# Patient Record
Sex: Male | Born: 1960 | Race: Black or African American | Hispanic: No | Marital: Single | Smoking: Never smoker
Health system: Southern US, Community
[De-identification: ages and names within clinical notes are randomized; demographics above are authoritative.]

## PROBLEM LIST (undated history)

## (undated) DIAGNOSIS — I2699 Other pulmonary embolism without acute cor pulmonale: Secondary | ICD-10-CM

## (undated) DIAGNOSIS — R0600 Dyspnea, unspecified: Secondary | ICD-10-CM

## (undated) DIAGNOSIS — I2510 Atherosclerotic heart disease of native coronary artery without angina pectoris: Secondary | ICD-10-CM

## (undated) DIAGNOSIS — I4891 Unspecified atrial fibrillation: Secondary | ICD-10-CM

## (undated) DIAGNOSIS — I5031 Acute diastolic (congestive) heart failure: Secondary | ICD-10-CM

## (undated) DIAGNOSIS — M5137 Other intervertebral disc degeneration, lumbosacral region: Secondary | ICD-10-CM

## (undated) DIAGNOSIS — I10 Essential (primary) hypertension: Secondary | ICD-10-CM

## (undated) DIAGNOSIS — L0390 Cellulitis, unspecified: Secondary | ICD-10-CM

## (undated) DIAGNOSIS — D649 Anemia, unspecified: Secondary | ICD-10-CM

## (undated) DIAGNOSIS — R5383 Other fatigue: Secondary | ICD-10-CM

## (undated) DIAGNOSIS — D638 Anemia in other chronic diseases classified elsewhere: Secondary | ICD-10-CM

## (undated) DIAGNOSIS — Z79899 Other long term (current) drug therapy: Secondary | ICD-10-CM

## (undated) DIAGNOSIS — M159 Polyosteoarthritis, unspecified: Secondary | ICD-10-CM

## (undated) DIAGNOSIS — I248 Other forms of acute ischemic heart disease: Secondary | ICD-10-CM

## (undated) DIAGNOSIS — G4733 Obstructive sleep apnea (adult) (pediatric): Secondary | ICD-10-CM

## (undated) DIAGNOSIS — I5032 Chronic diastolic (congestive) heart failure: Secondary | ICD-10-CM

## (undated) DIAGNOSIS — N390 Urinary tract infection, site not specified: Secondary | ICD-10-CM

## (undated) DIAGNOSIS — R5381 Other malaise: Secondary | ICD-10-CM

## (undated) DIAGNOSIS — M1990 Unspecified osteoarthritis, unspecified site: Secondary | ICD-10-CM

## (undated) DIAGNOSIS — N200 Calculus of kidney: Secondary | ICD-10-CM

## (undated) DIAGNOSIS — N281 Cyst of kidney, acquired: Secondary | ICD-10-CM

## (undated) DIAGNOSIS — J90 Pleural effusion, not elsewhere classified: Secondary | ICD-10-CM

## (undated) DIAGNOSIS — R7303 Prediabetes: Secondary | ICD-10-CM

## (undated) DIAGNOSIS — E538 Deficiency of other specified B group vitamins: Secondary | ICD-10-CM

## (undated) DIAGNOSIS — M1A09X0 Idiopathic chronic gout, multiple sites, without tophus (tophi): Secondary | ICD-10-CM

## (undated) DIAGNOSIS — Z6841 Body Mass Index (BMI) 40.0 and over, adult: Secondary | ICD-10-CM

## (undated) DIAGNOSIS — Z7901 Long term (current) use of anticoagulants: Secondary | ICD-10-CM

## (undated) DIAGNOSIS — I480 Paroxysmal atrial fibrillation: Secondary | ICD-10-CM

## (undated) DIAGNOSIS — E782 Mixed hyperlipidemia: Secondary | ICD-10-CM

## (undated) DIAGNOSIS — R768 Other specified abnormal immunological findings in serum: Secondary | ICD-10-CM

## (undated) DIAGNOSIS — R319 Hematuria, unspecified: Secondary | ICD-10-CM

## (undated) DIAGNOSIS — Z9989 Dependence on other enabling machines and devices: Secondary | ICD-10-CM

## (undated) DIAGNOSIS — H409 Unspecified glaucoma: Secondary | ICD-10-CM

## (undated) HISTORY — DX: Pleural effusion, not elsewhere classified: J90

## (undated) HISTORY — DX: Chronic diastolic (congestive) heart failure: I50.32

## (undated) HISTORY — DX: Cellulitis, unspecified: L03.90

## (undated) HISTORY — DX: Other malaise: R53.81

## (undated) HISTORY — DX: Other fatigue: R53.83

## (undated) HISTORY — DX: Idiopathic chronic gout, multiple sites, without tophus (tophi): M1A.09X0

## (undated) HISTORY — DX: Cyst of kidney, acquired: N28.1

## (undated) HISTORY — DX: Unspecified atrial fibrillation: I48.91

## (undated) HISTORY — DX: Anemia in other chronic diseases classified elsewhere: D63.8

## (undated) HISTORY — DX: Mixed hyperlipidemia: E78.2

## (undated) HISTORY — DX: Deficiency of other specified B group vitamins: E53.8

## (undated) HISTORY — DX: Hematuria, unspecified: R31.9

## (undated) HISTORY — DX: Morbid (severe) obesity due to excess calories: E66.01

## (undated) HISTORY — DX: Acute diastolic (congestive) heart failure: I50.31

## (undated) HISTORY — DX: Essential (primary) hypertension: I10

## (undated) HISTORY — DX: Other intervertebral disc degeneration, lumbosacral region: M51.37

## (undated) HISTORY — DX: Polyosteoarthritis, unspecified: M15.9

## (undated) HISTORY — DX: Body Mass Index (BMI) 40.0 and over, adult: Z684

## (undated) HISTORY — DX: Other forms of acute ischemic heart disease: I24.8

## (undated) HISTORY — DX: Other specified abnormal immunological findings in serum: R76.8

## (undated) HISTORY — DX: Long term (current) use of anticoagulants: Z79.01

## (undated) HISTORY — DX: Unspecified glaucoma: H40.9

## (undated) HISTORY — PX: CYSTOSCOPY W/ STONE MANIPULATION: SHX1427

## (undated) HISTORY — DX: Atherosclerotic heart disease of native coronary artery without angina pectoris: I25.10

## (undated) HISTORY — DX: Urinary tract infection, site not specified: N39.0

## (undated) HISTORY — DX: Calculus of kidney: N20.0

## (undated) HISTORY — DX: Prediabetes: R73.03

## (undated) HISTORY — DX: Paroxysmal atrial fibrillation: I48.0

## (undated) HISTORY — DX: Other pulmonary embolism without acute cor pulmonale: I26.99

## (undated) HISTORY — DX: Other long term (current) drug therapy: Z79.899

---
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
## 2002-02-07 ENCOUNTER — Emergency Department (HOSPITAL_COMMUNITY): Admission: EM | Admit: 2002-02-07 | Discharge: 2002-02-07 | Payer: Self-pay | Admitting: Emergency Medicine

## 2002-09-30 ENCOUNTER — Inpatient Hospital Stay (HOSPITAL_COMMUNITY): Admission: EM | Admit: 2002-09-30 | Discharge: 2002-10-06 | Payer: Self-pay | Admitting: Internal Medicine

## 2002-10-05 ENCOUNTER — Encounter: Payer: Self-pay | Admitting: Internal Medicine

## 2002-10-14 ENCOUNTER — Encounter: Admission: RE | Admit: 2002-10-14 | Discharge: 2002-10-14 | Payer: Self-pay | Admitting: Internal Medicine

## 2010-08-18 ENCOUNTER — Observation Stay (HOSPITAL_COMMUNITY)
Admission: EM | Admit: 2010-08-18 | Discharge: 2010-08-19 | Disposition: A | Discharge status: Home / Self Care | Payer: BC Managed Care – PPO | Attending: Nurse Practitioner | Admitting: Nurse Practitioner

## 2010-08-18 DIAGNOSIS — L02419 Cutaneous abscess of limb, unspecified: Principal | ICD-10-CM | POA: Insufficient documentation

## 2010-08-18 LAB — DIFFERENTIAL
Basophils Relative: 0 % (ref 0–1)
Lymphocytes Relative: 11 % — ABNORMAL LOW (ref 12–46)
Lymphs Abs: 1.7 10*3/uL (ref 0.7–4.0)
Monocytes Relative: 12 % (ref 3–12)
Neutro Abs: 11.1 10*3/uL — ABNORMAL HIGH (ref 1.7–7.7)
Neutrophils Relative %: 76 % (ref 43–77)

## 2010-08-18 LAB — CBC
HCT: 33.1 % — ABNORMAL LOW (ref 39.0–52.0)
Hemoglobin: 10.7 g/dL — ABNORMAL LOW (ref 13.0–17.0)
MCH: 28.2 pg (ref 26.0–34.0)
MCV: 87.1 fL (ref 78.0–100.0)
RBC: 3.8 MIL/uL — ABNORMAL LOW (ref 4.22–5.81)

## 2010-08-18 LAB — BASIC METABOLIC PANEL
CO2: 27 mEq/L (ref 19–32)
Chloride: 100 mEq/L (ref 96–112)
GFR calc Af Amer: >60 mL/min (ref >60)
Potassium: 3.9 mEq/L (ref 3.5–5.1)
Sodium: 135 mEq/L (ref 135–145)

## 2010-08-18 LAB — PROTIME-INR: Prothrombin Time: 15.9 seconds — ABNORMAL HIGH (ref 11.6–15.2)

## 2010-08-18 LAB — D-DIMER, QUANTITATIVE: D-Dimer, Quant: 1.2 ug/mL-FEU — ABNORMAL HIGH (ref 0.00–0.48)

## 2010-08-20 DIAGNOSIS — M7989 Other specified soft tissue disorders: Secondary | ICD-10-CM

## 2010-08-25 LAB — CULTURE, BLOOD (ROUTINE X 2)
Culture  Setup Time: 201202191121
Culture  Setup Time: 201202191121

## 2010-08-27 ENCOUNTER — Inpatient Hospital Stay (HOSPITAL_COMMUNITY)
Admission: EM | Admit: 2010-08-27 | Discharge: 2010-08-31 | DRG: 278 | Disposition: A | Discharge status: Home Health | Payer: BC Managed Care – PPO | Attending: Internal Medicine | Admitting: Internal Medicine

## 2010-08-27 DIAGNOSIS — M7989 Other specified soft tissue disorders: Secondary | ICD-10-CM

## 2010-08-27 DIAGNOSIS — D509 Iron deficiency anemia, unspecified: Secondary | ICD-10-CM | POA: Diagnosis present

## 2010-08-27 DIAGNOSIS — M109 Gout, unspecified: Secondary | ICD-10-CM | POA: Diagnosis present

## 2010-08-27 DIAGNOSIS — I10 Essential (primary) hypertension: Secondary | ICD-10-CM | POA: Diagnosis present

## 2010-08-27 DIAGNOSIS — B353 Tinea pedis: Secondary | ICD-10-CM | POA: Diagnosis present

## 2010-08-27 DIAGNOSIS — G4733 Obstructive sleep apnea (adult) (pediatric): Secondary | ICD-10-CM | POA: Diagnosis present

## 2010-08-27 DIAGNOSIS — H409 Unspecified glaucoma: Secondary | ICD-10-CM | POA: Diagnosis present

## 2010-08-27 DIAGNOSIS — L02419 Cutaneous abscess of limb, unspecified: Principal | ICD-10-CM | POA: Diagnosis present

## 2010-08-27 HISTORY — DX: Essential (primary) hypertension: I10

## 2010-08-27 HISTORY — DX: Morbid (severe) obesity due to excess calories: E66.01

## 2010-08-27 LAB — SEDIMENTATION RATE: Sed Rate: 136 mm/hr — ABNORMAL HIGH (ref 0–16)

## 2010-08-27 LAB — DIFFERENTIAL
Basophils Relative: 0 % (ref 0–1)
Eosinophils Absolute: 0.4 10*3/uL (ref 0.0–0.7)
Eosinophils Relative: 2 % (ref 0–5)
Lymphs Abs: 2.6 10*3/uL (ref 0.7–4.0)
Monocytes Relative: 7 % (ref 3–12)
Neutrophils Relative %: 74 % (ref 43–77)

## 2010-08-27 LAB — BASIC METABOLIC PANEL
BUN: 14 mg/dL (ref 6–23)
Creatinine, Ser: 1.36 mg/dL (ref 0.4–1.5)
GFR calc Af Amer: >60 mL/min (ref >60)
GFR calc non Af Amer: 55 mL/min — ABNORMAL LOW (ref >60)

## 2010-08-27 LAB — CBC
MCH: 27.4 pg (ref 26.0–34.0)
MCV: 86.1 fL (ref 78.0–100.0)
Platelets: 447 10*3/uL — ABNORMAL HIGH (ref 150–400)
RDW: 15.7 % — ABNORMAL HIGH (ref 11.5–15.5)
WBC: 16.2 10*3/uL — ABNORMAL HIGH (ref 4.0–10.5)

## 2010-08-28 LAB — VANCOMYCIN, TROUGH: Vancomycin Tr: 11.5 ug/mL (ref 10.0–20.0)

## 2010-08-28 LAB — HEMOGLOBIN A1C
Hgb A1c MFr Bld: 6.3 % — ABNORMAL HIGH (ref <5.7)
Mean Plasma Glucose: 134 mg/dL — ABNORMAL HIGH (ref <117)

## 2010-08-28 LAB — FOLATE: Folate: >20 ng/mL

## 2010-08-28 LAB — FERRITIN: Ferritin: 387 ng/mL — ABNORMAL HIGH (ref 22–322)

## 2010-08-29 ENCOUNTER — Encounter (HOSPITAL_COMMUNITY): Payer: Self-pay | Admitting: Radiology

## 2010-08-29 ENCOUNTER — Inpatient Hospital Stay (HOSPITAL_COMMUNITY): Payer: BC Managed Care – PPO

## 2010-08-29 HISTORY — DX: Morbid (severe) obesity due to excess calories: E66.01

## 2010-08-29 LAB — BASIC METABOLIC PANEL
CO2: 26 mEq/L (ref 19–32)
Calcium: 8.8 mg/dL (ref 8.4–10.5)
Creatinine, Ser: 0.83 mg/dL (ref 0.4–1.5)
GFR calc Af Amer: >60 mL/min (ref >60)
GFR calc non Af Amer: >60 mL/min (ref >60)

## 2010-08-29 LAB — VANCOMYCIN, TROUGH: Vancomycin Tr: 23.3 ug/mL — ABNORMAL HIGH (ref 10.0–20.0)

## 2010-08-29 MED ORDER — IOHEXOL 300 MG/ML  SOLN: 100.0000 mL | Freq: Once | INTRAMUSCULAR | Status: AC | PRN

## 2010-08-29 MED ORDER — IOHEXOL 300 MG/ML  SOLN
100.0000 mL | Freq: Once | INTRAMUSCULAR | Status: AC | PRN
  Administered 2010-08-29: 100 mL via INTRAVENOUS

## 2010-08-30 LAB — CBC
MCH: 27.9 pg (ref 26.0–34.0)
Platelets: 421 10*3/uL — ABNORMAL HIGH (ref 150–400)
RBC: 3.58 MIL/uL — ABNORMAL LOW (ref 4.22–5.81)

## 2010-08-30 LAB — BASIC METABOLIC PANEL
CO2: 26 mEq/L (ref 19–32)
Calcium: 8.8 mg/dL (ref 8.4–10.5)
Creatinine, Ser: 0.8 mg/dL (ref 0.4–1.5)
GFR calc Af Amer: >60 mL/min (ref >60)
GFR calc non Af Amer: >60 mL/min (ref >60)

## 2010-08-31 ENCOUNTER — Inpatient Hospital Stay (HOSPITAL_COMMUNITY): Payer: BC Managed Care – PPO

## 2010-08-31 LAB — CBC
HCT: 31.1 % — ABNORMAL LOW (ref 39.0–52.0)
Platelets: 406 10*3/uL — ABNORMAL HIGH (ref 150–400)
RBC: 3.61 MIL/uL — ABNORMAL LOW (ref 4.22–5.81)
RDW: 15.7 % — ABNORMAL HIGH (ref 11.5–15.5)
WBC: 12.6 10*3/uL — ABNORMAL HIGH (ref 4.0–10.5)

## 2010-09-06 NOTE — H&P (Signed)
NAME:  Colin Hall, Colin Hall                 ACCOUNT NO.:  [id number]  MEDICAL RECORD NO.:  [id number]           PATIENT TYPE:  E  LOCATION:  [facility]                         FACILITY:  [facility]  PHYSICIAN:  Debbe Odea, M.D.     DATE OF BIRTH:  04-May-1961  DATE OF ADMISSION:  08/27/2010 DATE OF DISCHARGE:                             HISTORY & PHYSICAL   PRIMARY CARE PHYSICIAN:  [name], MD in [place]:  Swelling of left leg.  HISTORY OF PRESENT ILLNESS:  This is a 50 year old morbidly obese male with a history of hypertension, glaucoma, gout, sleep apnea who developed cellulitis of his left leg about 8 days ago.  He was seen in the ER here, given 2 doses of vancomycin and discharged to home on clindamycin.  He states he has been taking the clindamycin appropriately and the redness had appeared to be improving up until last night.  He noticed yesterday an increase in pain and noticed that the redness was extending beyond the prior markings and now into his dorsum of his foot and above the knee.  He does not complain of any fevers.  He states that the swelling is not any worse.  He has had a venous Doppler in the ER which is negative for DVT and positive for prominent lymph nodes in the groin.  PAST MEDICAL HISTORY: 1. Hypertension. 2. Anemia. 3. Cellulitis of the left leg in 2004. 4. Glaucoma. 5. Gout. 6. Morbid obesity. 7. Obstructive sleep apnea, uses a BiPAP.  PAST SURGICAL HISTORY:  Tonsillectomy.  SOCIAL HISTORY:  Does not smoke, drink or use any drugs.  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  He is single.  FAMILY HISTORY:  Positive for heart disease in aunts and uncles.  ALLERGIES:  No known drug allergies.  MEDICATIONS:  As obtained from the patient. 1. Allopurinol 200 mg daily. 2. Folic acid Q000111Q mcg daily. 3. Labetalol 200 mg twice a day. 4. Lumigan eyedrops 1 drop in each eye at bedtime. 5. Ramipril  20 mg daily. 6. Spironolactone 25 mg daily. 7. Vitamin B12 1 tablet daily. 8. Clindamycin 300 mg twice a day. 9. Percocet p.r.n. which was started for his cellulitis.  REVIEW OF SYSTEMS:  No recent weight loss or weight gain.  No fever, chills, or sweats.  No frequent headaches.  HEENT:  No blurred vision, double vision, sore throat, sinus trouble or earache.  RESPIRATORY:  No cough or shortness of breath.  CARDIAC:  No chest pain, palpitations. He does have trouble with pedal edema.  GI:  No nausea, vomiting, diarrhea or constipation.  GU:  No dysuria or hematuria.  HEMATOLOGIC: No easy bruising.  SKIN:  No rash.  MUSCULOSKELETAL:  Pain in both knees.  NEUROLOGIC:  No history of strokes or seizures.  PSYCHOLOGIC: No anxiety or depression.  PHYSICAL EXAMINATION:  VITAL SIGNS:  Blood pressure 131/59, pulse 72, respiratory rate 16, temperature 98.3, oxygen saturation 98% on room air. HEENT:  Pupils are equal, round and reactive to light.  Extraocular movements are intact.  Conjunctivae is pink.  No scleral icterus.  Oral mucosa is moist.  Oropharynx is clear.  Good dentition. NECK:  Supple.  No carotid bruits.  No thyromegaly. HEART:  Regular rate and rhythm.  No murmurs. LUNGS:  Clear bilaterally.  Normal respiratory effort.  No use of accessory muscles. ABDOMEN:  Obese, soft, nontender.  Bowel sounds positive, nondistended. EXTREMITIES:  No cyanosis or clubbing.  He has edema bilaterally, worse in the left leg. NEUROLOGIC:  Cranial nerves II through XII intact.  Able to move all four extremities appropriately. PSYCHOLOGIC:  Awake, alert, oriented x3.  Mood and affect is normal. SKIN:  Warm and dry.  He has erythema extending from above his left knee down to the dorsum of his foot and around the back to his calf.  LABORATORY DATA:  Blood work, WBC count is 16.2, hemoglobin 10.1, hematocrit 31.7, platelets 447.  Metabolic panel is within normal limits.  DIAGNOSTIC STUDIES:   Doppler of lower extremities negative for DVT. There is an large vascularized lymph nodes in the left groin.  ASSESSMENT AND PLAN: 1. Extensive cellulitis of the left lower extremity, failed treatment     with clindamycin.  He is currently receiving vancomycin in the ER.     I will continue this, but I will add Rocephin as well.  We will     keep the lower extremity elevated on two pillows and monitor for     fevers. 2. Hypertension.  Continue labetalol and ramipril. 3. Gout. 4. Anemia. 5. Glaucoma. 6. Sleep apnea.  The patient would like to be a full code.  DVT prophylaxis will be with heparin.  Time on admission was 45 minutes.     Debbe Odea, M.D.     SR/MEDQ  D:  08/27/2010  T:  08/27/2010  Job:  [id number]  cc:   [name], MD  Electronically Signed by Debbe Odea M.D. on 09/05/2010 01:08:14 PM

## 2010-09-07 NOTE — Discharge Summary (Signed)
NAMEJESIEL, Colin Hall NO.:  [id number]  MEDICAL RECORD NO.:  [id number]           PATIENT TYPE:  I  LOCATION:  [id number]                         FACILITY:  [facility]  PHYSICIAN:  Sherryl Manges, M.D.  DATE OF BIRTH:  03-Jan-1961  DATE OF ADMISSION:  08/27/2010 DATE OF DISCHARGE:  08/31/2010                              DISCHARGE SUMMARY   PRIMARY MD:  Colin Rile, MD, [place], [place].  DISCHARGE DIAGNOSES: 1. Left lower extremity cellulitis. 2. Tinea interdigitalis. 3. Morbid obesity. 4. Obstructive sleep apnea syndrome, on nocturnal CPAP. 5. Hypertension. 6. Gout. 7. Chronic anemia, iron deficiency. 8. Glaucoma.  DISCHARGE MEDICATIONS: 1. Clotrimazole 1% cream apply topically b.i.d. between toes of her     feet. 2. Nu-Iron 150 mg p.o. b.i.d. 3. Vancomycin 1250 mg IV q.12 h. for 7 days only. 4. Allopurinol 200 mg p.o. daily. 5. Folic acid OTC 1 tablet p.o. daily. 6. Furosemide 20 mg p.o. p.r.n. daily as needed for lower extremity     swelling. 7. Albuterol 200 mg p.o. b.i.d. 8. Lumigan 0.03% eye drop, 1 drop each eye nightly. 9. Percocet (5/325) 1-2 tablets p.o. p.r.n. q.6 h. for pain. 10.Potassium chloride 20 mEq p.o. p.r.n. daily when taking furosemide. 11.Ramipril 20 mg p.o. daily. 12.Spironolactone 25 mg p.o. daily. 13.Vitamin B12 OTC 1 tablet p.o. daily.  Note: Clindamycin has been discontinued.  PROCEDURES: 1. CT scan left lower extremity August 30, 2010.  This showed scattered     punctate calcifications and periosteal irregularity in the tibia     and fibula, most characteristic for chronic venous insufficiency.     There was cutaneous thickening and considerable subcutaneous edema     in the lower leg, especially calf and distally in the calf, which     is nonspecific, but cellulitis certainly cannot be excluded.  No     abscess identified.  There is edema in the distal portion of the     contralateral right thigh medially, incidentally  seen on uppermost     images, severe osteoarthritis of the knee, small Baker cyst. 2. Chest x-ray August 31, 2010, report was still pending at the time of     this dictation. 3. PICC line placement August 31, 2010.  This was an uncomplicated     procedure.  CONSULTATIONS:  None.  ADMISSION HISTORY:  As in H and P notes of August 27, 2010, dictated by Dr. Debbe Hall. However in brief, this is a 50 year old male, with known history of hypertension, chronic anemia, history of left lower extremity cellulitis in 2004, glaucoma, gout, morbid obesity, obstructive sleep apnea syndrome on nocturnal CPAP, presenting with progressive redness and swelling of left lower extremity, of approximately 8 days' duration.  He was initially seen in the emergency department, administered 2 days of  vancomycin, discharged on clindamycin which he has been taking appropriately.  There was some improvement; however, on the night prior to presentation, he noticed increased redness, which appeared to have extended above his knee and down into the dorsum of his foot.  He represented to the emergency department, was admitted further evaluation, investigation  and management.  CLINICAL COURSE: 1. Left lower extremity cellulitis.  This appears to have failed     outpatient treatment.  The patient was started on intravenous     vancomycin.  Blood cultures were sent, remained consistently     negative.  Clinically, he improved, with diminution of swelling and     redness.  CT scan of the left lower extremity on August 30, 2010, was     unremarkable for abscess, but he did demonstrate evidence of     cellulitis.  Over the course of his hospitalization, there was no     recorded pyrexia.  He did experience steady diminution in white     cell count from 16.2 on the day of presentation to 12.6 on August 31, 2010.  As of that date, he was on day #5 of vancomycin and was now     able to ambulate without any discomfort,  whatsoever. 2. Obstructive sleep apnea syndrome.  The patient was managed with     nocturnal CPAP with no problems referable to this.  3. Hypertension.  This was readily controlled on preadmission     antihypertensives.  4. Gout.  The patient continues on allopurinol.  He had no flare-up     during the course of this hospitalization.  5. Tinea interdigitalis.  The patient was noted to have tinea     interdigitalis of bilateral feet.  He has prescribed topical     Lotrimin AF for this.  This may indeed have been the portal of     entry for his left lower extremity cellulitis.  6. Glaucoma.  The patient continues on preadmission ophthalmic     solution.  7. Chronic anemia.  The patient does have a history of chronic anemia.     His hemoglobin at the time of presentation was 10.1.  MCV was 86.     Hematinic studies demonstrated iron level of 16, total iron binding     capacity 188, percentage saturation 9, B12 was 1194, serum folate     was over 20, ferritin was 387.  The patient therefore appears to     have a component of iron deficiency, and has been placed on iron     supplementation, accordingly.  DISPOSITION:  The patient was on August 31, 2010, feeling considerably better.  There were no new issues.  He was considered clinically stable for discharge on a further 7 days of intravenous vancomycin therapy.  He was therefore discharged accordingly.  ACTIVITY:  As tolerated.  Recommended to increase activity slowly.  DIET:  Heart healthy.  FOLLOWUP INSTRUCTIONS:  The patient is to follow up with his primary MD, Dr. Gilford Hall within 1 week of discharge.     Sherryl Manges, M.D.     CO/MEDQ  D:  08/31/2010  T:  08/31/2010  Job:  [id number]  cc:   Colin Rile, MD  Electronically Signed by Sherryl Manges M.D. on 09/07/2010 03:41:04 PM

## 2013-01-24 ENCOUNTER — Emergency Department (HOSPITAL_COMMUNITY)
Admission: EM | Admit: 2013-01-24 | Discharge: 2013-01-25 | Disposition: A | Discharge status: Home / Self Care | Payer: BC Managed Care – PPO | Attending: Emergency Medicine | Admitting: Emergency Medicine

## 2013-01-24 ENCOUNTER — Emergency Department (HOSPITAL_COMMUNITY): Payer: BC Managed Care – PPO

## 2013-01-24 ENCOUNTER — Encounter (HOSPITAL_COMMUNITY): Payer: Self-pay | Admitting: *Deleted

## 2013-01-24 DIAGNOSIS — Z862 Personal history of diseases of the blood and blood-forming organs and certain disorders involving the immune mechanism: Secondary | ICD-10-CM | POA: Insufficient documentation

## 2013-01-24 DIAGNOSIS — Z8669 Personal history of other diseases of the nervous system and sense organs: Secondary | ICD-10-CM | POA: Insufficient documentation

## 2013-01-24 DIAGNOSIS — R112 Nausea with vomiting, unspecified: Secondary | ICD-10-CM | POA: Insufficient documentation

## 2013-01-24 DIAGNOSIS — I10 Essential (primary) hypertension: Secondary | ICD-10-CM | POA: Insufficient documentation

## 2013-01-24 DIAGNOSIS — Z8639 Personal history of other endocrine, nutritional and metabolic disease: Secondary | ICD-10-CM | POA: Insufficient documentation

## 2013-01-24 DIAGNOSIS — R109 Unspecified abdominal pain: Secondary | ICD-10-CM | POA: Insufficient documentation

## 2013-01-24 DIAGNOSIS — N200 Calculus of kidney: Secondary | ICD-10-CM | POA: Insufficient documentation

## 2013-01-24 LAB — CBC WITH DIFFERENTIAL/PLATELET
Eosinophils Absolute: 0.1 10*3/uL (ref 0.0–0.7)
Lymphs Abs: 1.5 10*3/uL (ref 0.7–4.0)
MCH: 27.9 pg (ref 26.0–34.0)
Neutrophils Relative %: 73 % (ref 43–77)
Platelets: 257 10*3/uL (ref 150–400)
RBC: 4.16 MIL/uL — ABNORMAL LOW (ref 4.22–5.81)
WBC: 10.5 10*3/uL (ref 4.0–10.5)

## 2013-01-24 LAB — BASIC METABOLIC PANEL
GFR calc Af Amer: 89 mL/min — ABNORMAL LOW (ref >90)
GFR calc non Af Amer: 77 mL/min — ABNORMAL LOW (ref >90)
Glucose, Bld: 146 mg/dL — ABNORMAL HIGH (ref 70–99)
Potassium: 3.9 mEq/L (ref 3.5–5.1)
Sodium: 132 mEq/L — ABNORMAL LOW (ref 135–145)

## 2013-01-24 LAB — URINALYSIS, ROUTINE W REFLEX MICROSCOPIC
Nitrite: NEGATIVE
Specific Gravity, Urine: 1.031 — ABNORMAL HIGH (ref 1.005–1.030)
Urobilinogen, UA: 0.2 mg/dL (ref 0.0–1.0)

## 2013-01-24 MED ORDER — SODIUM CHLORIDE 0.9 % IV BOLUS (SEPSIS)
1000.0000 mL | Freq: Once | INTRAVENOUS | Status: AC
  Administered 2013-01-24: 1000 mL via INTRAVENOUS

## 2013-01-24 MED ORDER — MORPHINE SULFATE 4 MG/ML IJ SOLN
4.0000 mg | Freq: Once | INTRAMUSCULAR | Status: AC
  Administered 2013-01-24: 4 mg via INTRAVENOUS
  Filled 2013-01-24: qty 1

## 2013-01-24 MED ORDER — ONDANSETRON HCL 4 MG/2ML IJ SOLN
4.0000 mg | Freq: Once | INTRAMUSCULAR | Status: AC
  Administered 2013-01-24: 4 mg via INTRAVENOUS
  Filled 2013-01-24: qty 2

## 2013-01-24 NOTE — ED Notes (Signed)
[id number] Expected date:<BR> Expected time:<BR> Means of arrival:<BR> Comments:<BR> Hold Triage 1

## 2013-01-24 NOTE — ED Provider Notes (Signed)
CSN: [id number]     Arrival date & time 01/24/13  2226 History     First MD Initiated Contact with Patient 01/24/13 2248     Chief Complaint  Patient presents with  . Back Pain  . Emesis   (Consider location/radiation/quality/duration/timing/severity/associated sxs/prior Treatment) HPI Comments: Patient is a 52 year old male with a past medical history of morbid obesity, hypertension, and kidney stones who presents with flank pain since 0530 am. Symptoms started gradually and progressively worsened since the onset. The pain is located in his bilateral flanks and radiates around to his abdomen and down to his groin. The pain is described as sharp and severe. No alleviating/aggravating factors. The patient has tried nothing for symptoms without relief. Associated symptoms include nausea. Patient denies fever, headache, vomiting, diarrhea, chest pain, SOB, dysuria, constipation.    Past Medical History  Diagnosis Date  . Morbid obesity 08/29/2010    pt is 500 lbs  . Hypertension   . Anemia   . Gout   . Glaucoma   . Sleep apnea    History reviewed. No pertinent past surgical history. No family history on file. History  Substance Use Topics  . Smoking status: Never Smoker   . Smokeless tobacco: Not on file  . Alcohol Use: No    Review of Systems  Genitourinary: Positive for flank pain.  All other systems reviewed and are negative.    Allergies  Review of patient's allergies indicates no known allergies.  Home Medications  No current outpatient prescriptions on file. BP 165/83  Pulse 62  Temp(Src) 98.2 F (36.8 C) (Oral)  Resp 18  Ht 5\' 10"  (1.778 m)  SpO2 97% Physical Exam  Nursing note and vitals reviewed. Constitutional: He is oriented to person, place, and time. He appears well-developed and well-nourished. No distress.  HENT:  Head: Normocephalic and atraumatic.  Eyes: Conjunctivae and EOM are normal.  Neck: Normal range of motion.  Cardiovascular: Normal rate  and regular rhythm.  Exam reveals no gallop and no friction rub.   No murmur heard. Pulmonary/Chest: Effort normal and breath sounds normal. He has no wheezes. He has no rales. He exhibits no tenderness.  Abdominal: Soft. He exhibits no distension. There is no tenderness. There is no rebound and no guarding.  Morbidly obese abdomen. Difficult to properly assess due to body habitus.   Genitourinary:  No CVA tenderness  Musculoskeletal: Normal range of motion.  Neurological: He is alert and oriented to person, place, and time.  Speech is goal-oriented. Moves limbs without ataxia.   Skin: Skin is warm and dry.  Psychiatric: He has a normal mood and affect. His behavior is normal.    ED Course   Procedures (including critical care time)  Labs Reviewed  URINALYSIS, ROUTINE W REFLEX MICROSCOPIC - Abnormal; Notable for the following:    APPearance CLOUDY (*)    Specific Gravity, Urine 1.031 (*)    Hgb urine dipstick LARGE (*)    All other components within normal limits  CBC WITH DIFFERENTIAL - Abnormal; Notable for the following:    RBC 4.16 (*)    Hemoglobin 11.6 (*)    HCT 36.0 (*)    RDW 16.5 (*)    Monocytes Absolute 1.1 (*)    All other components within normal limits  BASIC METABOLIC PANEL - Abnormal; Notable for the following:    Sodium 132 (*)    Glucose, Bld 146 (*)    GFR calc non Af Amer 77 (*)  GFR calc Af Amer 89 (*)    All other components within normal limits  URINE MICROSCOPIC-ADD ON - Abnormal; Notable for the following:    Squamous Epithelial / LPF FEW (*)    All other components within normal limits  URINE CULTURE   No results found.  1. Nephrolithiasis     MDM  11:03 PM Labs and urinalysis pending. Patient will have fluids, morphine, and zofran. Vitals stable and patient afebrile.   12:44 AM Labs unremarkable. Urinalysis shows hemoglobin. Patient likely has kidney stone. Patient is too heavy for the CT scan so I will treat him presumptively for  kidney stone. Patient will be discharged with Percocet, zofran, and flomax. Patient will follow up with his Urologist.   Alvina Chou, PA-C 01/25/13 [phone]

## 2013-01-24 NOTE — ED Notes (Signed)
Onset of back pain, decreased fluid intake, nausea since 0530

## 2013-01-25 ENCOUNTER — Other Ambulatory Visit: Payer: Self-pay | Admitting: *Deleted

## 2013-01-25 DIAGNOSIS — N200 Calculus of kidney: Secondary | ICD-10-CM

## 2013-01-25 MED ORDER — TAMSULOSIN HCL 0.4 MG PO CAPS: 0.4000 mg | ORAL_CAPSULE | Freq: Two times a day (BID) | ORAL | Status: DC

## 2013-01-25 MED ORDER — ONDANSETRON 4 MG PO TBDP: 4.0000 mg | ORAL_TABLET | Freq: Three times a day (TID) | ORAL | Status: DC | PRN

## 2013-01-25 MED ORDER — OXYCODONE-ACETAMINOPHEN 5-325 MG PO TABS: 2.0000 | ORAL_TABLET | ORAL | Status: DC | PRN

## 2013-01-25 MED ORDER — KETOROLAC TROMETHAMINE 30 MG/ML IJ SOLN
30.0000 mg | Freq: Once | INTRAMUSCULAR | Status: AC
  Administered 2013-01-25: 30 mg via INTRAVENOUS
  Filled 2013-01-25: qty 1

## 2013-01-26 ENCOUNTER — Ambulatory Visit
Admission: RE | Admit: 2013-01-26 | Discharge: 2013-01-26 | Disposition: A | Discharge status: Home / Self Care | Payer: BC Managed Care – PPO | Source: Ambulatory Visit | Attending: *Deleted | Admitting: *Deleted

## 2013-01-26 DIAGNOSIS — N200 Calculus of kidney: Secondary | ICD-10-CM

## 2013-01-26 LAB — URINE CULTURE: Special Requests: NORMAL

## 2013-01-26 NOTE — ED Provider Notes (Signed)
Medical screening examination/treatment/procedure(s) were performed by non-physician practitioner and as supervising physician I was immediately available for consultation/collaboration.   Babette Relic, MD 01/26/13 2107

## 2013-01-29 ENCOUNTER — Ambulatory Visit
Admission: RE | Admit: 2013-01-29 | Discharge: 2013-01-29 | Disposition: A | Discharge status: Home / Self Care | Payer: BC Managed Care – PPO | Source: Ambulatory Visit | Attending: Internal Medicine | Admitting: Internal Medicine

## 2013-01-29 ENCOUNTER — Other Ambulatory Visit: Payer: Self-pay | Admitting: Internal Medicine

## 2013-01-29 DIAGNOSIS — R079 Chest pain, unspecified: Secondary | ICD-10-CM

## 2013-01-29 DIAGNOSIS — R109 Unspecified abdominal pain: Secondary | ICD-10-CM

## 2013-01-29 MED ORDER — IOHEXOL 350 MG/ML SOLN
150.0000 mL | Freq: Once | INTRAVENOUS | Status: AC | PRN
  Administered 2013-01-29: 150 mL via INTRAVENOUS

## 2013-01-29 MED ORDER — IOHEXOL 300 MG/ML  SOLN: 30.0000 mL | Freq: Once | INTRAMUSCULAR | Status: DC | PRN

## 2013-02-19 ENCOUNTER — Ambulatory Visit: Payer: Self-pay | Admitting: Specialist

## 2013-02-19 LAB — COMPREHENSIVE METABOLIC PANEL
Alkaline Phosphatase: 87 U/L (ref 50–136)
BUN: 18 mg/dL (ref 7–18)
Bilirubin,Total: 0.7 mg/dL (ref 0.2–1.0)
Creatinine: 0.75 mg/dL (ref 0.60–1.30)
EGFR (Non-African Amer.): >60
Glucose: 109 mg/dL — ABNORMAL HIGH (ref 65–99)
Osmolality: 274 (ref 275–301)
Potassium: 3.7 mmol/L (ref 3.5–5.1)
SGOT(AST): 22 U/L (ref 15–37)
SGPT (ALT): 26 U/L (ref 12–78)
Total Protein: 8.8 g/dL — ABNORMAL HIGH (ref 6.4–8.2)

## 2013-02-19 LAB — FOLATE: Folic Acid: 18.7 ng/mL (ref 3.1–100.0)

## 2013-02-19 LAB — IRON AND TIBC
Iron Bind.Cap.(Total): 324 ug/dL (ref 250–450)
Iron: 45 ug/dL — ABNORMAL LOW (ref 65–175)
Unbound Iron-Bind.Cap.: 279 ug/dL

## 2013-02-19 LAB — CBC WITH DIFFERENTIAL/PLATELET
Basophil #: 0 10*3/uL (ref 0.0–0.1)
Basophil %: 0.2 %
Eosinophil %: 3 %
HCT: 35.6 % — ABNORMAL LOW (ref 40.0–52.0)
HGB: 11.8 g/dL — ABNORMAL LOW (ref 13.0–18.0)
Lymphocyte #: 1.6 10*3/uL (ref 1.0–3.6)
Lymphocyte %: 28 %
MCH: 28.3 pg (ref 26.0–34.0)
Neutrophil %: 53.7 %
Platelet: 237 10*3/uL (ref 150–440)
RDW: 16.7 % — ABNORMAL HIGH (ref 11.5–14.5)
WBC: 5.6 10*3/uL (ref 3.8–10.6)

## 2013-02-19 LAB — MAGNESIUM: Magnesium: 1.7 mg/dL — ABNORMAL LOW

## 2013-02-19 LAB — HEMOGLOBIN A1C: Hemoglobin A1C: 6.4 % — ABNORMAL HIGH (ref 4.2–6.3)

## 2013-02-19 LAB — BILIRUBIN, DIRECT: Bilirubin, Direct: 0.1 mg/dL (ref 0.00–0.20)

## 2013-02-19 LAB — LIPASE, BLOOD: Lipase: 78 U/L (ref 73–393)

## 2013-02-19 LAB — FERRITIN: Ferritin (ARMC): 110 ng/mL (ref 8–388)

## 2013-02-19 LAB — TSH: Thyroid Stimulating Horm: 1.05 u[IU]/mL

## 2013-02-19 LAB — PHOSPHORUS: Phosphorus: 3 mg/dL (ref 2.5–4.9)

## 2013-02-26 LAB — PROTIME-INR
INR: 1.1
Prothrombin Time: 14.1 secs (ref 11.5–14.7)

## 2013-03-01 HISTORY — PX: CARDIAC CATHETERIZATION: SHX172

## 2013-06-10 ENCOUNTER — Ambulatory Visit: Payer: Self-pay | Admitting: Specialist

## 2013-07-01 ENCOUNTER — Ambulatory Visit: Payer: Self-pay | Admitting: Specialist

## 2013-07-09 ENCOUNTER — Ambulatory Visit: Payer: Self-pay | Admitting: Specialist

## 2013-08-29 HISTORY — PX: LAPAROSCOPIC GASTRIC SLEEVE RESECTION: SHX5895

## 2013-10-20 ENCOUNTER — Ambulatory Visit: Payer: Self-pay | Admitting: Specialist

## 2013-10-29 ENCOUNTER — Ambulatory Visit: Payer: Self-pay | Admitting: Specialist

## 2013-12-08 ENCOUNTER — Ambulatory Visit: Payer: Self-pay | Admitting: Specialist

## 2013-12-29 ENCOUNTER — Ambulatory Visit: Payer: Self-pay | Admitting: Specialist

## 2014-04-19 ENCOUNTER — Ambulatory Visit: Payer: Self-pay | Admitting: Specialist

## 2014-04-19 LAB — COMPREHENSIVE METABOLIC PANEL
ANION GAP: 9 (ref 7–16)
Albumin: 3.2 g/dL — ABNORMAL LOW (ref 3.4–5.0)
Alkaline Phosphatase: 90 U/L
BUN: 23 mg/dL — ABNORMAL HIGH (ref 7–18)
Bilirubin,Total: 0.7 mg/dL (ref 0.2–1.0)
CALCIUM: 8.6 mg/dL (ref 8.5–10.1)
CO2: 26 mmol/L (ref 21–32)
Chloride: 107 mmol/L (ref 98–107)
Creatinine: 0.7 mg/dL (ref 0.60–1.30)
Glucose: 85 mg/dL (ref 65–99)
OSMOLALITY: 286 (ref 275–301)
Potassium: 4.1 mmol/L (ref 3.5–5.1)
SGOT(AST): 20 U/L (ref 15–37)
SGPT (ALT): 21 U/L
Sodium: 142 mmol/L (ref 136–145)
Total Protein: 7.8 g/dL (ref 6.4–8.2)

## 2014-04-19 LAB — CBC WITH DIFFERENTIAL/PLATELET
BASOS ABS: 0.1 10*3/uL (ref 0.0–0.1)
BASOS PCT: 1.1 %
EOS ABS: 0.2 10*3/uL (ref 0.0–0.7)
EOS PCT: 3.4 %
HCT: 39.3 % — ABNORMAL LOW (ref 40.0–52.0)
HGB: 12.8 g/dL — ABNORMAL LOW (ref 13.0–18.0)
Lymphocyte #: 2.2 10*3/uL (ref 1.0–3.6)
Lymphocyte %: 46.4 %
MCH: 28.7 pg (ref 26.0–34.0)
MCHC: 32.7 g/dL (ref 32.0–36.0)
MCV: 88 fL (ref 80–100)
MONO ABS: 0.6 x10 3/mm (ref 0.2–1.0)
Monocyte %: 12.2 %
NEUTROS ABS: 1.7 10*3/uL (ref 1.4–6.5)
NEUTROS PCT: 36.9 %
Platelet: 191 10*3/uL (ref 150–440)
RBC: 4.47 10*6/uL (ref 4.40–5.90)
RDW: 16.1 % — AB (ref 11.5–14.5)
WBC: 4.7 10*3/uL (ref 3.8–10.6)

## 2014-04-19 LAB — IRON AND TIBC
IRON BIND. CAP.(TOTAL): 304 ug/dL (ref 250–450)
IRON SATURATION: 14 %
Iron: 43 ug/dL — ABNORMAL LOW (ref 65–175)
Unbound Iron-Bind.Cap.: 261 ug/dL

## 2014-04-19 LAB — MAGNESIUM: MAGNESIUM: 1.9 mg/dL

## 2014-04-19 LAB — PHOSPHORUS: PHOSPHORUS: 3.3 mg/dL (ref 2.5–4.9)

## 2014-04-19 LAB — AMYLASE: Amylase: 51 U/L (ref 25–115)

## 2014-04-19 LAB — FERRITIN: Ferritin (ARMC): 81 ng/mL (ref 8–388)

## 2014-04-19 LAB — FOLATE: Folic Acid: 9.6 ng/mL (ref 3.1–100.0)

## 2014-07-19 ENCOUNTER — Inpatient Hospital Stay (HOSPITAL_COMMUNITY)
Admission: AD | Admit: 2014-07-19 | Discharge: 2014-07-22 | DRG: 280 | Disposition: A | Discharge status: Home / Self Care | Payer: BC Managed Care – PPO | Source: Other Acute Inpatient Hospital | Attending: Cardiology | Admitting: Cardiology

## 2014-07-19 DIAGNOSIS — R0789 Other chest pain: Secondary | ICD-10-CM

## 2014-07-19 DIAGNOSIS — I110 Hypertensive heart disease with heart failure: Secondary | ICD-10-CM | POA: Diagnosis present

## 2014-07-19 DIAGNOSIS — I2584 Coronary atherosclerosis due to calcified coronary lesion: Secondary | ICD-10-CM | POA: Diagnosis present

## 2014-07-19 DIAGNOSIS — I10 Essential (primary) hypertension: Secondary | ICD-10-CM | POA: Diagnosis present

## 2014-07-19 DIAGNOSIS — R7989 Other specified abnormal findings of blood chemistry: Secondary | ICD-10-CM

## 2014-07-19 DIAGNOSIS — E876 Hypokalemia: Secondary | ICD-10-CM | POA: Diagnosis present

## 2014-07-19 DIAGNOSIS — I2510 Atherosclerotic heart disease of native coronary artery without angina pectoris: Secondary | ICD-10-CM | POA: Diagnosis present

## 2014-07-19 DIAGNOSIS — Z79899 Other long term (current) drug therapy: Secondary | ICD-10-CM | POA: Diagnosis not present

## 2014-07-19 DIAGNOSIS — I4510 Unspecified right bundle-branch block: Secondary | ICD-10-CM | POA: Diagnosis present

## 2014-07-19 DIAGNOSIS — I4891 Unspecified atrial fibrillation: Secondary | ICD-10-CM | POA: Diagnosis present

## 2014-07-19 DIAGNOSIS — Z7901 Long term (current) use of anticoagulants: Secondary | ICD-10-CM

## 2014-07-19 DIAGNOSIS — I214 Non-ST elevation (NSTEMI) myocardial infarction: Secondary | ICD-10-CM | POA: Diagnosis present

## 2014-07-19 DIAGNOSIS — I248 Other forms of acute ischemic heart disease: Secondary | ICD-10-CM | POA: Diagnosis present

## 2014-07-19 DIAGNOSIS — R079 Chest pain, unspecified: Secondary | ICD-10-CM

## 2014-07-19 DIAGNOSIS — I272 Other secondary pulmonary hypertension: Secondary | ICD-10-CM | POA: Diagnosis present

## 2014-07-19 DIAGNOSIS — N390 Urinary tract infection, site not specified: Secondary | ICD-10-CM | POA: Diagnosis present

## 2014-07-19 DIAGNOSIS — I878 Other specified disorders of veins: Secondary | ICD-10-CM | POA: Diagnosis present

## 2014-07-19 DIAGNOSIS — G4733 Obstructive sleep apnea (adult) (pediatric): Secondary | ICD-10-CM | POA: Diagnosis present

## 2014-07-19 DIAGNOSIS — I5031 Acute diastolic (congestive) heart failure: Secondary | ICD-10-CM

## 2014-07-19 DIAGNOSIS — J90 Pleural effusion, not elsewhere classified: Secondary | ICD-10-CM

## 2014-07-19 DIAGNOSIS — Z9884 Bariatric surgery status: Secondary | ICD-10-CM

## 2014-07-19 DIAGNOSIS — I2699 Other pulmonary embolism without acute cor pulmonale: Secondary | ICD-10-CM | POA: Diagnosis present

## 2014-07-19 DIAGNOSIS — Z9989 Dependence on other enabling machines and devices: Secondary | ICD-10-CM

## 2014-07-19 DIAGNOSIS — I2489 Other forms of acute ischemic heart disease: Secondary | ICD-10-CM | POA: Diagnosis present

## 2014-07-19 DIAGNOSIS — Z6841 Body Mass Index (BMI) 40.0 and over, adult: Secondary | ICD-10-CM | POA: Diagnosis not present

## 2014-07-19 DIAGNOSIS — R0609 Other forms of dyspnea: Secondary | ICD-10-CM | POA: Diagnosis present

## 2014-07-19 HISTORY — DX: Unspecified atrial fibrillation: I48.91

## 2014-07-19 HISTORY — DX: Dependence on other enabling machines and devices: Z99.89

## 2014-07-19 HISTORY — DX: Unspecified osteoarthritis, unspecified site: M19.90

## 2014-07-19 HISTORY — DX: Calculus of kidney: N20.0

## 2014-07-19 HISTORY — DX: Anemia, unspecified: D64.9

## 2014-07-19 HISTORY — DX: Obstructive sleep apnea (adult) (pediatric): G47.33

## 2014-07-19 NOTE — H&P (Addendum)
History and Physical  Patient ID: Colin Hall. MRN: [id number], SOB: 02/13/1961 54 y.o. Date of Encounter: 07/19/2014, 11:12 PM  Primary Physician: Bea Graff Primary Cardiologist: none  Chief Complaint: weakness, DOE  HPI: 54 y.o. male w/ PMHx significant for severe obesity s/p gastric sleeve, sleep apnea, HTN who presented to initially to [facility] with easy fatigue and shortness of breath. Was found to be in afib with RVR and had an elevated troponin. Due to difficulty in maintaining his rate and the elevated troponin, he was subsequently transported to [facility] on 07/19/2014.   He reports last feeling at baseline a week ago. Since then, has had nonspecific complaints of neck pain (?slept on it wrong), increased daytime sleepiness, intermittent diaphoresis. Due to these nonspecific issues, he made an appointment with his PCP today and while getting ready, he felt drastically fatigued and short of breath. Showering was a significant effort and he had trouble even getting dress. Felt lightheaded. These tasks were so difficult, he was 45 minutes late to Drs appointment.  Sent to ER due to symptoms.  At [facility], was found to be in afib with RVR. While at [facility], was seen by cardiology and was given aspirin, digoxin 0.5 mg x 1, diltizem 20, metoprolol 2.5 x 3,  heparin gtt, NLS 3 l bolues.  Pt denies PND, LE edema. States he maybe had some chest tightness a couple of days ago, short and brief when he laid down for bed. Nonpleuritic. Has not recurred and not present today. Chronic venous stasis in legs, denies any recent swelling or pain. No history of blood clots. No FH of cardiac dz. Nondrinker, nonsmoker.  Had cath done at [facility] point 03/2013, report in paper chart which reports normal coronary arteries, nl LV function.  Outside labs: Wbc 14 hct 40 plts 277  Na 139 k 3.9 Cr 1.3 Glucose 155  bun 24 Bicarb 25  Pro BNP 18,800 Trop 4.5 --> 3.2 mb 173 Ck 149  Nl  LFTS  UA with 5-10 wbcs, few bacterio   EKG revealed afib with rapid rate 140s, RBBB initially. Converted to sinus at 9:45, still with RBBB, PRWP and small QRS complexes.  Vitals showed pulse ranging to the 120s to 140s, BP 123XX123 to 123456 systolics. 438 lbs. CXR with no acute findings    Past Medical History  Diagnosis Date  . Morbid obesity 08/29/2010    pt is 500 lbs  . Hypertension   . Anemia   . Gout   . Glaucoma   . 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 Marital Status: Single    Spouse Name: N/A    Number of Children: N/A  . Years of Education: N/A   Occupational History  . Not on file.   Social History Main Topics  . Smoking status: Never Smoker   . Smokeless tobacco: Not on file  .  Alcohol Use: No  . Drug Use: No  . Sexual Activity: Not on file   Other Topics Concern  . Not on file   Social History Narrative  . No narrative on file     No family history on file.  Review of Systems: General: see HPI Cardiovascular: see HPI Dermatological: negative for rash Respiratory: negative for cough or wheezing Urologic: negative for hematuria Abdominal: negative for nausea, vomiting, diarrhea, bright red blood per rectum, melena, or hematemesis Neurologic: negative for visual changes, syncope, or dizziness All other systems reviewed and are otherwise negative except as noted above.  Labs: See HPI   Radiology/Studies:  No results found.   EKG: see HPI  Physical Exam: Blood pressure 112/80, pulse 96, temperature 99.1 F (37.3 C), temperature source Oral, resp. rate 20, height 5\' 9"  (1.753 m), weight 194.729 kg (429 lb 4.8 oz), SpO2 100 %. General: massively obese, in no acute distress. Head: Normocephalic,  atraumatic, sclera non-icteric, nares are without discharge Neck: Supple. Negative for carotid bruits. JVD not visualized Lungs: Clear bilaterally to auscultation without wheezes, rales, or rhonchi. Breathing is unlabored. Heart: distant heart sounds. RRR with S1 S2. No murmurs, rubs, or gallops appreciated. Abdomen: large panniculus, nontender. Msk:  Strength and tone appear normal for age. Extremities: warm and dry, chronic venous changes, +2 chronic edema Neuro: Alert and oriented X 3. Moves all extremities spontaneously. Psych:  Responds to questions appropriately with a normal affect.   Problem List 1. Afib with RVR 2. Elevated troponin, ?ACS vs. Supply demand mismatch 3. Morbid obesity 4. HTN 5. Sleep apnea, on CPAP 6. S/p gastric sleeve 7. U/A with infection? 8. ?Dehydrated  ASSESSMENT AND PLAN:  54 y.o. male w/ PMHx significant for severe obesity s/p gastric sleeve, sleep apnea, HTN who presented to initially to [facility] with easy fatigue and shortness of breath. Was found to be in afib with RVR and had an elevated troponin. Now back in sinus, outside troponins trending down.  In regards to afib, he has the risk factors for afib including obesity, sleep apnea, and HTN. Further evaluation with echo and thyroid studies. Currently back in sinus, will use metoprolol rather than labetolol due to relative hypotension. Anticoagulated with heparin.  Elevated troponin may represent infarct from ACS vs. Supply demand mismatch due to afib with RVR vs. other. Arguing against ACS is that fact that he had a completely normal cath 1.5 years ago. Furthermore, no symptoms to suggest ischemia. Possibly supply demand mismatch due to RVR but troponin is rather elevated to be attributed to this. History not consistent with PE. Plan at this time is to empirically treat for ACS with aspririn, heparin, statin, beta blocker and monitor with serial enzymes. Also treating PE as well. An echo assessing  wall motion could also help determine etiology of troponin elevation. NPO in case catheterization needed.  Based upon elevated Cr and BUN and dark urine, suggestive of dehydration. Elevated BNP difficult to interpret given afib with RVR. Received 3 L at OSH, continue low maintenance fluids here. Repeat UA (?infection vs. Dirty collection). Has foley from OSH which patient wants to maintain until AM.   Continue CPAP for apnea.  Prophylaxis with heparin Full code  Signed, Jamone Garrido C. MD 07/19/2014, 11:12 PM   Addendum: U/A here also noted to be c/w infection. Pt not really localizing but perhaps is the cause of his nonspecific complaints of not feeling well, trigger for afib, etc. Start cipro 500 bid, follow up cultures.

## 2014-07-20 ENCOUNTER — Encounter (HOSPITAL_COMMUNITY)
Admission: AD | Disposition: A | Discharge status: Home / Self Care | Payer: Self-pay | Source: Other Acute Inpatient Hospital | Attending: Cardiology

## 2014-07-20 ENCOUNTER — Encounter (HOSPITAL_COMMUNITY): Payer: Self-pay | Admitting: General Practice

## 2014-07-20 DIAGNOSIS — N390 Urinary tract infection, site not specified: Secondary | ICD-10-CM

## 2014-07-20 DIAGNOSIS — I2489 Other forms of acute ischemic heart disease: Secondary | ICD-10-CM

## 2014-07-20 DIAGNOSIS — I248 Other forms of acute ischemic heart disease: Secondary | ICD-10-CM | POA: Diagnosis present

## 2014-07-20 DIAGNOSIS — I110 Hypertensive heart disease with heart failure: Secondary | ICD-10-CM | POA: Diagnosis present

## 2014-07-20 DIAGNOSIS — I10 Essential (primary) hypertension: Secondary | ICD-10-CM | POA: Insufficient documentation

## 2014-07-20 DIAGNOSIS — I214 Non-ST elevation (NSTEMI) myocardial infarction: Secondary | ICD-10-CM

## 2014-07-20 DIAGNOSIS — I517 Cardiomegaly: Secondary | ICD-10-CM

## 2014-07-20 DIAGNOSIS — I4891 Unspecified atrial fibrillation: Secondary | ICD-10-CM | POA: Diagnosis present

## 2014-07-20 DIAGNOSIS — G4733 Obstructive sleep apnea (adult) (pediatric): Secondary | ICD-10-CM | POA: Diagnosis present

## 2014-07-20 DIAGNOSIS — Z9989 Dependence on other enabling machines and devices: Secondary | ICD-10-CM

## 2014-07-20 HISTORY — DX: Urinary tract infection, site not specified: N39.0

## 2014-07-20 HISTORY — DX: Unspecified atrial fibrillation: I48.91

## 2014-07-20 HISTORY — DX: Hypertensive heart disease with heart failure: I11.0

## 2014-07-20 HISTORY — DX: Essential (primary) hypertension: I10

## 2014-07-20 HISTORY — DX: Other forms of acute ischemic heart disease: I24.89

## 2014-07-20 HISTORY — DX: Other forms of acute ischemic heart disease: I24.8

## 2014-07-20 HISTORY — PX: LEFT HEART CATHETERIZATION WITH CORONARY ANGIOGRAM: SHX5451

## 2014-07-20 LAB — CBC
HCT: 33.3 % — ABNORMAL LOW (ref 39.0–52.0)
Hemoglobin: 10.9 g/dL — ABNORMAL LOW (ref 13.0–17.0)
MCH: 28.6 pg (ref 26.0–34.0)
MCHC: 32.7 g/dL (ref 30.0–36.0)
MCV: 87.4 fL (ref 78.0–100.0)
Platelets: 265 10*3/uL (ref 150–400)
RBC: 3.81 MIL/uL — ABNORMAL LOW (ref 4.22–5.81)
RDW: 15.2 % (ref 11.5–15.5)
WBC: 13.1 10*3/uL — AB (ref 4.0–10.5)

## 2014-07-20 LAB — BASIC METABOLIC PANEL
Anion gap: 10 (ref 5–15)
BUN: 21 mg/dL (ref 6–23)
CO2: 26 mmol/L (ref 19–32)
Calcium: 8.6 mg/dL (ref 8.4–10.5)
Chloride: 101 mEq/L (ref 96–112)
Creatinine, Ser: 1.09 mg/dL (ref 0.50–1.35)
GFR calc Af Amer: 87 mL/min — ABNORMAL LOW (ref >90)
GFR, EST NON AFRICAN AMERICAN: 75 mL/min — AB (ref >90)
GLUCOSE: 116 mg/dL — AB (ref 70–99)
POTASSIUM: 3.6 mmol/L (ref 3.5–5.1)
SODIUM: 137 mmol/L (ref 135–145)

## 2014-07-20 LAB — URINALYSIS, ROUTINE W REFLEX MICROSCOPIC
Glucose, UA: NEGATIVE mg/dL
KETONES UR: NEGATIVE mg/dL
NITRITE: NEGATIVE
PH: 6 (ref 5.0–8.0)
PROTEIN: NEGATIVE mg/dL
Specific Gravity, Urine: 1.024 (ref 1.005–1.030)
Urobilinogen, UA: 2 mg/dL — ABNORMAL HIGH (ref 0.0–1.0)

## 2014-07-20 LAB — CBC WITH DIFFERENTIAL/PLATELET
BASOS PCT: 1 % (ref 0–1)
Basophils Absolute: 0.1 10*3/uL (ref 0.0–0.1)
EOS ABS: 0.1 10*3/uL (ref 0.0–0.7)
EOS PCT: 1 % (ref 0–5)
HCT: 35.5 % — ABNORMAL LOW (ref 39.0–52.0)
HEMOGLOBIN: 12.1 g/dL — AB (ref 13.0–17.0)
LYMPHS PCT: 17 % (ref 12–46)
Lymphs Abs: 2.2 10*3/uL (ref 0.7–4.0)
MCH: 30.3 pg (ref 26.0–34.0)
MCHC: 34.1 g/dL (ref 30.0–36.0)
MCV: 88.8 fL (ref 78.0–100.0)
Monocytes Absolute: 1.2 10*3/uL — ABNORMAL HIGH (ref 0.1–1.0)
Monocytes Relative: 9 % (ref 3–12)
Neutro Abs: 9.6 10*3/uL — ABNORMAL HIGH (ref 1.7–7.7)
Neutrophils Relative %: 72 % (ref 43–77)
PLATELETS: 260 10*3/uL (ref 150–400)
RBC: 4 MIL/uL — AB (ref 4.22–5.81)
RDW: 15 % (ref 11.5–15.5)
WBC: 13.2 10*3/uL — AB (ref 4.0–10.5)

## 2014-07-20 LAB — URINE MICROSCOPIC-ADD ON

## 2014-07-20 LAB — TSH: TSH: 1.75 u[IU]/mL (ref 0.350–4.500)

## 2014-07-20 LAB — PROTIME-INR
INR: 1.22 (ref 0.00–1.49)
Prothrombin Time: 15.6 seconds — ABNORMAL HIGH (ref 11.6–15.2)

## 2014-07-20 LAB — TROPONIN I
Troponin I: 2.24 ng/mL (ref <0.031)
Troponin I: 2.36 ng/mL (ref <0.031)
Troponin I: 1.6 ng/mL (ref <0.031)

## 2014-07-20 LAB — HEPARIN LEVEL (UNFRACTIONATED)
HEPARIN UNFRACTIONATED: 0.11 [IU]/mL — AB (ref 0.30–0.70)
Heparin Unfractionated: <0.1 IU/mL — ABNORMAL LOW (ref 0.30–0.70)

## 2014-07-20 LAB — MRSA PCR SCREENING: MRSA by PCR: NEGATIVE

## 2014-07-20 LAB — MAGNESIUM: Magnesium: 2.1 mg/dL (ref 1.5–2.5)

## 2014-07-20 LAB — APTT: aPTT: 37 seconds (ref 24–37)

## 2014-07-20 SURGERY — LEFT HEART CATHETERIZATION WITH CORONARY ANGIOGRAM

## 2014-07-20 MED ORDER — ASPIRIN 81 MG PO CHEW
81.0000 mg | CHEWABLE_TABLET | ORAL | Status: AC
  Administered 2014-07-20: 81 mg via ORAL
  Filled 2014-07-20: qty 1

## 2014-07-20 MED ORDER — ASPIRIN EC 81 MG PO TBEC
81.0000 mg | DELAYED_RELEASE_TABLET | Freq: Every day | ORAL | Status: DC
  Administered 2014-07-21: 81 mg via ORAL
  Filled 2014-07-20 (×2): qty 1

## 2014-07-20 MED ORDER — SIMVASTATIN 40 MG PO TABS
40.0000 mg | ORAL_TABLET | Freq: Every day | ORAL | Status: DC
  Administered 2014-07-20: 40 mg via ORAL
  Filled 2014-07-20: qty 1

## 2014-07-20 MED ORDER — VERAPAMIL HCL 2.5 MG/ML IV SOLN
INTRAVENOUS | Status: AC
  Filled 2014-07-20: qty 2

## 2014-07-20 MED ORDER — SODIUM CHLORIDE 0.9 % IV SOLN
INTRAVENOUS | Status: DC
  Administered 2014-07-20: 03:00:00 via INTRAVENOUS

## 2014-07-20 MED ORDER — HEPARIN SODIUM (PORCINE) 1000 UNIT/ML IJ SOLN
INTRAMUSCULAR | Status: AC
  Filled 2014-07-20: qty 1

## 2014-07-20 MED ORDER — ALLOPURINOL 100 MG PO TABS
200.0000 mg | ORAL_TABLET | Freq: Every day | ORAL | Status: DC
  Administered 2014-07-20 – 2014-07-22 (×3): 200 mg via ORAL
  Filled 2014-07-20 (×3): qty 2

## 2014-07-20 MED ORDER — FENTANYL CITRATE 0.05 MG/ML IJ SOLN
INTRAMUSCULAR | Status: AC
  Filled 2014-07-20: qty 2

## 2014-07-20 MED ORDER — HEPARIN BOLUS VIA INFUSION
3500.0000 [IU] | Freq: Once | INTRAVENOUS | Status: AC
  Administered 2014-07-20: 3500 [IU] via INTRAVENOUS
  Filled 2014-07-20: qty 3500

## 2014-07-20 MED ORDER — SODIUM CHLORIDE 0.9 % IJ SOLN: 3.0000 mL | INTRAMUSCULAR | Status: DC | PRN

## 2014-07-20 MED ORDER — ACETAMINOPHEN 325 MG PO TABS: 650.0000 mg | ORAL_TABLET | ORAL | Status: DC | PRN

## 2014-07-20 MED ORDER — SODIUM CHLORIDE 0.9 % IV SOLN: 250.0000 mL | INTRAVENOUS | Status: DC | PRN

## 2014-07-20 MED ORDER — NITROGLYCERIN 1 MG/10 ML FOR IR/CATH LAB
INTRA_ARTERIAL | Status: AC
Start: 2014-07-20 — End: 2014-07-20
  Filled 2014-07-20: qty 10

## 2014-07-20 MED ORDER — HEPARIN (PORCINE) IN NACL 2-0.9 UNIT/ML-% IJ SOLN
INTRAMUSCULAR | Status: AC
  Filled 2014-07-20: qty 1500

## 2014-07-20 MED ORDER — HEPARIN BOLUS VIA INFUSION
4000.0000 [IU] | Freq: Once | INTRAVENOUS | Status: AC
  Administered 2014-07-20: 4000 [IU] via INTRAVENOUS
  Filled 2014-07-20: qty 4000

## 2014-07-20 MED ORDER — HEPARIN (PORCINE) IN NACL 100-0.45 UNIT/ML-% IJ SOLN
2600.0000 [IU]/h | INTRAMUSCULAR | Status: DC
  Administered 2014-07-20: 2200 [IU]/h via INTRAVENOUS
  Administered 2014-07-20: 1500 [IU]/h via INTRAVENOUS
  Filled 2014-07-20: qty 250

## 2014-07-20 MED ORDER — MIDAZOLAM HCL 2 MG/2ML IJ SOLN
INTRAMUSCULAR | Status: AC
  Filled 2014-07-20: qty 2

## 2014-07-20 MED ORDER — SODIUM CHLORIDE 0.9 % IJ SOLN
3.0000 mL | Freq: Two times a day (BID) | INTRAMUSCULAR | Status: DC
  Administered 2014-07-20: 3 mL via INTRAVENOUS

## 2014-07-20 MED ORDER — METOPROLOL TARTRATE 25 MG PO TABS
25.0000 mg | ORAL_TABLET | Freq: Two times a day (BID) | ORAL | Status: DC
  Administered 2014-07-20 – 2014-07-22 (×5): 25 mg via ORAL
  Filled 2014-07-20 (×5): qty 1

## 2014-07-20 MED ORDER — CIPROFLOXACIN HCL 500 MG PO TABS
500.0000 mg | ORAL_TABLET | Freq: Two times a day (BID) | ORAL | Status: DC
  Administered 2014-07-20 – 2014-07-21 (×3): 500 mg via ORAL
  Filled 2014-07-20 (×3): qty 1

## 2014-07-20 MED ORDER — SODIUM CHLORIDE 0.9 % IV SOLN
INTRAVENOUS | Status: AC
Start: 2014-07-20 — End: 2014-07-20
  Administered 2014-07-20: 21:00:00 via INTRAVENOUS

## 2014-07-20 MED ORDER — NITROGLYCERIN 0.4 MG SL SUBL: 0.4000 mg | SUBLINGUAL_TABLET | SUBLINGUAL | Status: DC | PRN

## 2014-07-20 MED ORDER — LIDOCAINE HCL (PF) 1 % IJ SOLN
INTRAMUSCULAR | Status: AC
Start: 2014-07-20 — End: 2014-07-20
  Filled 2014-07-20: qty 30

## 2014-07-20 MED ORDER — ONDANSETRON HCL 4 MG/2ML IJ SOLN: 4.0000 mg | Freq: Four times a day (QID) | INTRAMUSCULAR | Status: DC | PRN

## 2014-07-20 MED FILL — Heparin Sodium (Porcine) 100 Unt/ML in Sodium Chloride 0.45%: INTRAMUSCULAR | Qty: 250 | Status: AC

## 2014-07-20 NOTE — Progress Notes (Signed)
Utilization review completed. Rally Ouch, RN, BSN. 

## 2014-07-20 NOTE — Progress Notes (Signed)
CRITICAL VALUE ALERT  Critical value received:  Troponin 2.24  Date of notification:  07/20/13  Time of notification:  0208  Critical value read back: yes  Nurse who received alert:  Renita Papa, RN    MD notified (1st page):  Cletus Gash, MD   Time of first page:  0210  Responding MD:  Cletus Gash, MD   Time MD responded:  [phone]

## 2014-07-20 NOTE — Care Management Note (Addendum)
    Page 1 of 1   07/21/2014     1:34:32 PM CARE MANAGEMENT NOTE 07/21/2014  Patient:  Colin, Hall   Account Number:  [id number]  Date Initiated:  07/20/2014  Documentation initiated by:  GRAVES-BIGELOW,Brevin Mcfadden  Subjective/Objective Assessment:   Pt admitted as a transfer from [facility] with easy fatigue and shortness of breath. Was found to be in afib with RVR and had an elevated troponin. Cath today. SP gastric sleeve-has lost 130 pounds.     Action/Plan:   No needs identified by CM at this time.   Anticipated DC Date:  07/21/2014   Anticipated DC Plan:  [place]  CM consult      Choice offered to / List presented to:             Status of service:  Completed, signed off Medicare Important Message given?  NO (If response is "NO", the following Medicare IM given date fields will be blank) Date Medicare IM given:   Medicare IM given by:   Date Additional Medicare IM given:   Additional Medicare IM given by:    Discharge Disposition:  HOME/SELF CARE  Per UR Regulation:  Reviewed for med. necessity/level of care/duration of stay  If discussed at [place] of Stay Meetings, dates discussed:    Comments:   07-21-14 1333 Jacqlyn Krauss, RN,BSN [phone] per rep at [facility]: xarelto: $120 90 day mail order/ $40 at retail 30 day  07-21-14 [address], [place] [phone] CM has benefits check in process for xarelto. Will provide pt with 30 day free card/ no co pay card. Pt can call to see if the no copay card will work for his PPO plan. . Pt will need Rx for 30 day free. CM did call [facility] in [place] to make sure xarelto is avaialble and it is. No further needs from CM at this time.

## 2014-07-20 NOTE — CV Procedure (Signed)
   Cardiac Catheterization Procedure Note  Name: Colin Hall. MRN: [id number] DOB: [birth year]  Procedure: Left Heart Cath, Selective Coronary Angiography, LV angiography  Indication: NSTEMI  Medications:  Sedation:  2 mg IV Versed, 25 mcg IV Fentanyl  Contrast:  100 ml Omnipaque   Procedural Details: The right wrist was prepped, draped, and anesthetized with 1% lidocaine. Using the modified Seldinger technique, a 5 French sheath was introduced into the right radial artery. 3 mg of verapamil was administered through the sheath, weight-based unfractionated heparin was administered intravenously. A Jackie catheter was used for selective coronary angiography. A pigtail catheter was used for left ventriculography. A 3 DRC catheter was used to engage the right coronary artery Catheter exchanges were performed over an exchange length guidewire. There were no immediate procedural complications. A TR band was used for radial hemostasis at the completion of the procedure.  The patient was transferred to the post catheterization recovery area for further monitoring.  Procedural Findings:  Hemodynamics: AO:  112/80   mmHg LV:  115/16    mmHg LVEDP: 24  mmHg  Coronary angiography: Coronary dominance: Right   Left Main:  Normal  Left Anterior Descending (LAD):  Normal in size with minor irregularities.  1st diagonal (D1):  Large in size with minor irregularities.  2nd diagonal (D2):  Normal in size with minor irregularities.  3rd diagonal (D3):  Very small in size.  Circumflex (LCx):  Normal  1st obtuse marginal:  Minor irregularities  2nd obtuse marginal:  Medium in size with minor irregularities.  3rd obtuse marginal:  Medium in size with minor irregularities.    Right Coronary Artery: Very large in size with minor irregularities. There is 20% stenosis in the midsegment.  Posterior descending artery: Large in size with minor irregularities.  Posterior AV segment: Normal in  size with minor irregularities.  Posterolateral branchs:  Minor irregularities.  Left ventriculography: Left ventricular systolic function is normal , LVEF is estimated at 50-55%  Final Conclusions:   1. Mild nonobstructive coronary artery disease. 2. Low normal LV systolic function with moderately elevated left ventricular end-diastolic pressure  Recommendations:  Elevated cardiac enzymes is likely due to supply demand ischemia. Continue treatment for atrial fibrillation. The patient likely has diastolic dysfunction and underlying sleep apnea.  Kathlyn Sacramento MD, [facility] 07/20/2014, 4:48 PM

## 2014-07-20 NOTE — Progress Notes (Signed)
PT setup on CPAP. Home settings: 18cm H20, nasal mask. No O2 bled in. Water added to humidity chamber. Pt resting comfortably at this time. RT will monitor.

## 2014-07-20 NOTE — Progress Notes (Signed)
Subjective: Feels better  Objective: Vital signs in last 24 hours: Temp:  [97.4 F (36.3 C)-99.1 F (37.3 C)] 98.4 F (36.9 C) (01/20 0717) Pulse Rate:  [92-96] 92 (01/20 0136) Resp:  [20-26] 26 (01/20 0136) BP: (104-112)/(76-84) 111/84 mmHg (01/20 0315) SpO2:  [96 %-100 %] 96 % (01/20 0136) FiO2 (%):  [21 %] 21 % (01/20 0136) Weight:  [429 lb 4.8 oz (194.729 kg)] 429 lb 4.8 oz (194.729 kg) (01/19 2259) Last BM Date: 07/19/14  Intake/Output from previous day: 01/19 0701 - 01/20 0700 In: 120 [P.O.:120] Out: 225 [Urine:225] Intake/Output this shift:    Medications Current Facility-Administered Medications  Medication Dose Route Frequency Provider Last Rate Last Dose  . 0.9 %  sodium chloride infusion   Intravenous Continuous Cletus Gash, MD 50 mL/hr at 07/20/14 0328    . acetaminophen (TYLENOL) tablet 650 mg  650 mg Oral Q4H PRN Cletus Gash, MD      . allopurinol (ZYLOPRIM) tablet 200 mg  200 mg Oral Daily Cletus Gash, MD      . aspirin EC tablet 81 mg  81 mg Oral Daily Cletus Gash, MD      . ciprofloxacin (CIPRO) tablet 500 mg  500 mg Oral BID Cletus Gash, MD      . heparin ADULT infusion 100 units/mL (25000 units/250 mL)  2,200 Units/hr Intravenous Continuous Rogue Bussing, RPH 22 mL/hr at 07/20/14 0630 2,200 Units/hr at 07/20/14 0630  . metoprolol tartrate (LOPRESSOR) tablet 25 mg  25 mg Oral BID Cletus Gash, MD   0 mg at 07/20/14 0015  . nitroGLYCERIN (NITROSTAT) SL tablet 0.4 mg  0.4 mg Sublingual Q5 Min x 3 PRN Cletus Gash, MD      . ondansetron [facility]) injection 4 mg  4 mg Intravenous Q6H PRN Cletus Gash, MD      . simvastatin (ZOCOR) tablet 40 mg  40 mg Oral q1800 Cletus Gash, MD        PE: General appearance: alert, cooperative and no distress Lungs: clear to auscultation bilaterally Heart: regular rate and rhythm, S1, S2 normal, no murmur, click, rub or gallop Extremities: No obvious edema Pulses: 2+ and  symmetric Skin: Warm and dry Neurologic: Grossly normal  Lab Results:   Recent Labs  07/20/14 0055 07/20/14 0540  WBC 13.2* 13.1*  HGB 12.1* 10.9*  HCT 35.5* 33.3*  PLT 260 265   BMET  Recent Labs  07/20/14 0055  NA 137  K 3.6  CL 101  CO2 26  GLUCOSE 116*  BUN 21  CREATININE 1.09  CALCIUM 8.6   PT/INR  Recent Labs  07/20/14 0055  LABPROT 15.6*  INR 1.22   Lipid Panel  No results found for: CHOL, TRIG, HDL, CHOLHDL, VLDL, LDLCALC, LDLDIRECT  Cardiac Panel (last 3 results)  Recent Labs  07/20/14 0055 07/20/14 0540  TROPONINI 2.24* 2.36*      Assessment/Plan 54 y.o. male w/ PMHx significant for severe obesity s/p gastric sleeve, sleep apnea, HTN who presented to initially to [facility] with easy fatigue and shortness of breath. Was found to be in afib with RVR and had an elevated troponin. .  Active Problems:   Atrial fibrillation with rapid ventricular response  Maintaining SR on lopressor 25mg  BID. RBBB.  No old EKGs to compare.  Echo pending.  CHADSVASC: 1.  Consider adding long-term anticoagulation.   Elevated troponin Trending up:  2.24 >> 2.36.  Normal coronaries by cath at outside hospital in Sept 2014. A little higher  than I would expect from demand ischemia.    UTI  Started on Cipro.  Culture pending.     Morbid obesity  SP gastric sleeve.  He has lost 130 pounds.   Get up and ambulate.   LOS: 1 day    Kirklin Mcduffee PA-C 07/20/2014 8:07 AM

## 2014-07-20 NOTE — Progress Notes (Signed)
ANTICOAGULATION CONSULT NOTE - Follow Up Consult  Pharmacy Consult for heparin Indication: chest pain/ACS, atrial fibrillation and r/o PE  Labs:  Recent Labs  07/20/14 0055 07/20/14 0455  HGB 12.1*  --   HCT 35.5*  --   PLT 260  --   APTT 37  --   LABPROT 15.6*  --   INR 1.22  --   HEPARINUNFRC  --  <0.10*  CREATININE 1.09  --   TROPONINI 2.24*  --     Assessment: 54yo male undetectable on heparin with initial dosing for ACS, Afib, r/o PE.  Goal of Therapy:  Heparin level 0.3-0.7 units/ml   Plan:  Will bolus with heparin 4000 units and increase gtt by 4 units/kgABW/hr to 2200 units/hr and check level in 6hr.  Wynona Neat, PharmD, BCPS  07/20/2014,6:26 AM

## 2014-07-20 NOTE — Interval H&P Note (Signed)
History and Physical Interval Note:  07/20/2014 3:55 PM  Colin Hall.  has presented today for surgery, with the diagnosis of cp  The various methods of treatment have been discussed with the patient and family. After consideration of risks, benefits and other options for treatment, the patient has consented to  Procedure(s): LEFT HEART CATHETERIZATION WITH CORONARY ANGIOGRAM (N/A) as a surgical intervention .  The patient's history has been reviewed, patient examined, no change in status, stable for surgery.  I have reviewed the patient's chart and labs.  Questions were answered to the patient's satisfaction.     Kathlyn Sacramento

## 2014-07-20 NOTE — Progress Notes (Signed)
ANTICOAGULATION CONSULT NOTE - Follow Up Consult  Pharmacy Consult for heparin Indication: chest pain/ACS and atrial fibrillation  No Known Allergies  Patient Measurements: Height: 5\' 9"  (175.3 cm) Weight: (!) 428 lb 3.2 oz (194.23 kg) IBW/kg (Calculated) : 70.7 Heparin Dosing Weight: 120 kg  Vital Signs: Temp: 98.4 F (36.9 C) (01/20 0717) Temp Source: Oral (01/20 0717) BP: 122/86 mmHg (01/20 1049) Pulse Rate: 93 (01/20 1049)  Labs:  Recent Labs  07/20/14 0055 07/20/14 0455 07/20/14 0540 07/20/14 1300  HGB 12.1*  --  10.9*  --   HCT 35.5*  --  33.3*  --   PLT 260  --  265  --   APTT 37  --   --   --   LABPROT 15.6*  --   --   --   INR 1.22  --   --   --   HEPARINUNFRC  --  <0.10*  --  0.11*  CREATININE 1.09  --   --   --   TROPONINI 2.24*  --  2.36* 1.60*    Estimated Creatinine Clearance: 131.6 mL/min (by C-G formula based on Cr of 1.09).   Medications:  Infusions:  . sodium chloride 50 mL/hr at 07/20/14 0328  . heparin 2,200 Units/hr (07/20/14 1030)    Assessment: 54 y/o male who presented to [facility] with fatigue and SOB found to be in Afib w/ RVR and positive troponin. He was transferred to [facility] for management. Plan is for cath today. He converted to NSR. Heparin level is SUBtherapeutic at 0.11 on 2200 units/hr. No bleeding noted, Hb is 10.9, platelets are normal.   Goal of Therapy:  Heparin level 0.3-0.7 units/ml Monitor platelets by anticoagulation protocol: Yes   Plan:  - Heparin 3500 units IV bolus then increase drip to 2600 units/hr - 6 hr heparin level or f/u after cath - Daily heparin level and CBC - Monitor for s/sx of bleeding  [facility], [place].D., BCPS Clinical Pharmacist Pager: [phone] 07/20/2014 2:36 PM

## 2014-07-20 NOTE — Progress Notes (Signed)
ANTICOAGULATION CONSULT NOTE - Initial Consult  Pharmacy Consult for Heparin  Indication: chest pain/ACS, r/o PE, new onset afib  No Known Allergies  Patient Measurements: Height: 5\' 9"  (175.3 cm) Weight: (!) 429 lb 4.8 oz (194.729 kg) IBW/kg (Calculated) : 70.7  Vital Signs: Temp: 99.1 F (37.3 C) (01/19 2259) Temp Source: Oral (01/19 2259) BP: 112/80 mmHg (01/19 2259) Pulse Rate: 96 (01/19 2259)  Labs: From outside hospital, see MD note  Medical History: Past Medical History  Diagnosis Date  . Morbid obesity 08/29/2010    pt is 500 lbs  . Hypertension   . Anemia   . Gout   . Glaucoma   . Sleep apnea      Assessment: 54 y/o M tx from [place] with heparin infusing at 1500 units/hr, elevated troponin from outside labs, otherwise no significant abnormalities.   Goal of Therapy:  Heparin level 0.3-0.7 units/ml Monitor platelets by anticoagulation protocol: Yes   Plan:  -Continue heparin at 1500 units/hr -HL with AM labs -Daily CBC/HL -Monitor for bleeding  Colin Hall 07/20/2014,12:42 AM

## 2014-07-20 NOTE — Progress Notes (Signed)
*  PRELIMINARY RESULTS* Echocardiogram 2D Echocardiogram has been performed.  Colin Hall 07/20/2014, 12:00 PM

## 2014-07-21 ENCOUNTER — Encounter (HOSPITAL_COMMUNITY): Payer: Self-pay | Admitting: Radiology

## 2014-07-21 ENCOUNTER — Inpatient Hospital Stay (HOSPITAL_COMMUNITY): Payer: BC Managed Care – PPO

## 2014-07-21 DIAGNOSIS — I5031 Acute diastolic (congestive) heart failure: Secondary | ICD-10-CM

## 2014-07-21 DIAGNOSIS — J90 Pleural effusion, not elsewhere classified: Secondary | ICD-10-CM

## 2014-07-21 DIAGNOSIS — I2510 Atherosclerotic heart disease of native coronary artery without angina pectoris: Secondary | ICD-10-CM

## 2014-07-21 DIAGNOSIS — I2699 Other pulmonary embolism without acute cor pulmonale: Secondary | ICD-10-CM

## 2014-07-21 DIAGNOSIS — G4733 Obstructive sleep apnea (adult) (pediatric): Secondary | ICD-10-CM

## 2014-07-21 HISTORY — DX: Pleural effusion, not elsewhere classified: J90

## 2014-07-21 HISTORY — DX: Acute diastolic (congestive) heart failure: I50.31

## 2014-07-21 HISTORY — DX: Other pulmonary embolism without acute cor pulmonale: I26.99

## 2014-07-21 HISTORY — DX: Atherosclerotic heart disease of native coronary artery without angina pectoris: I25.10

## 2014-07-21 LAB — URINE CULTURE
COLONY COUNT: NO GROWTH
Culture: NO GROWTH

## 2014-07-21 LAB — BASIC METABOLIC PANEL
ANION GAP: 8 (ref 5–15)
BUN: 12 mg/dL (ref 6–23)
CALCIUM: 8.3 mg/dL — AB (ref 8.4–10.5)
CHLORIDE: 103 meq/L (ref 96–112)
CO2: 25 mmol/L (ref 19–32)
Creatinine, Ser: 0.77 mg/dL (ref 0.50–1.35)
GFR calc non Af Amer: >90 mL/min (ref >90)
GLUCOSE: 174 mg/dL — AB (ref 70–99)
POTASSIUM: 3.3 mmol/L — AB (ref 3.5–5.1)
Sodium: 136 mmol/L (ref 135–145)

## 2014-07-21 LAB — CBC
HEMATOCRIT: 35.3 % — AB (ref 39.0–52.0)
HEMOGLOBIN: 11.6 g/dL — AB (ref 13.0–17.0)
MCH: 29.4 pg (ref 26.0–34.0)
MCHC: 32.9 g/dL (ref 30.0–36.0)
MCV: 89.4 fL (ref 78.0–100.0)
PLATELETS: 290 10*3/uL (ref 150–400)
RBC: 3.95 MIL/uL — ABNORMAL LOW (ref 4.22–5.81)
RDW: 15.1 % (ref 11.5–15.5)
WBC: 11.7 10*3/uL — AB (ref 4.0–10.5)

## 2014-07-21 MED ORDER — DILTIAZEM HCL 30 MG PO TABS
30.0000 mg | ORAL_TABLET | Freq: Four times a day (QID) | ORAL | Status: DC
  Administered 2014-07-21 – 2014-07-22 (×3): 30 mg via ORAL
  Filled 2014-07-21 (×3): qty 1

## 2014-07-21 MED ORDER — POTASSIUM CHLORIDE CRYS ER 20 MEQ PO TBCR
40.0000 meq | EXTENDED_RELEASE_TABLET | Freq: Once | ORAL | Status: AC
  Administered 2014-07-21: 40 meq via ORAL
  Filled 2014-07-21: qty 2

## 2014-07-21 MED ORDER — FUROSEMIDE 10 MG/ML IJ SOLN
40.0000 mg | Freq: Once | INTRAMUSCULAR | Status: AC
  Administered 2014-07-21: 40 mg via INTRAVENOUS
  Filled 2014-07-21: qty 4

## 2014-07-21 MED ORDER — IOHEXOL 350 MG/ML SOLN
100.0000 mL | Freq: Once | INTRAVENOUS | Status: AC | PRN
  Administered 2014-07-21: 100 mL via INTRAVENOUS

## 2014-07-21 MED ORDER — POTASSIUM CHLORIDE CRYS ER 20 MEQ PO TBCR
40.0000 meq | EXTENDED_RELEASE_TABLET | Freq: Once | ORAL | Status: AC
  Administered 2014-07-21: 40 meq via ORAL
  Filled 2014-07-21: qty 4

## 2014-07-21 MED ORDER — OFF THE BEAT BOOK
Freq: Once | Status: AC
  Administered 2014-07-21: 16:00:00
  Filled 2014-07-21: qty 1

## 2014-07-21 MED ORDER — CIPROFLOXACIN HCL 500 MG PO TABS
500.0000 mg | ORAL_TABLET | Freq: Two times a day (BID) | ORAL | Status: DC
  Administered 2014-07-21 – 2014-07-22 (×2): 500 mg via ORAL
  Filled 2014-07-21 (×2): qty 1

## 2014-07-21 MED ORDER — ATORVASTATIN CALCIUM 20 MG PO TABS
20.0000 mg | ORAL_TABLET | Freq: Every day | ORAL | Status: DC
  Administered 2014-07-21: 20 mg via ORAL
  Filled 2014-07-21: qty 1

## 2014-07-21 MED ORDER — RIVAROXABAN 15 MG PO TABS
15.0000 mg | ORAL_TABLET | Freq: Two times a day (BID) | ORAL | Status: DC
  Administered 2014-07-21 – 2014-07-22 (×2): 15 mg via ORAL
  Filled 2014-07-21 (×2): qty 1

## 2014-07-21 MED ORDER — DILTIAZEM HCL 25 MG/5ML IV SOLN
10.0000 mg | Freq: Once | INTRAVENOUS | Status: AC | PRN
  Administered 2014-07-21: 10 mg via INTRAVENOUS
  Filled 2014-07-21: qty 5

## 2014-07-21 MED ORDER — RIVAROXABAN 15 MG PO TABS
15.0000 mg | ORAL_TABLET | ORAL | Status: AC
  Administered 2014-07-21: 15 mg via ORAL
  Filled 2014-07-21: qty 1

## 2014-07-21 NOTE — Progress Notes (Signed)
Patient: Colin Hall / Admit Date: 07/19/2014 / Date of Encounter: 07/21/2014, 8:13 AM   Subjective: Still notes DOE. No SOB at rest. No CP.  Objective: Telemetry: sinus tach with RBBB, low 100s-120s at times Physical Exam: Blood pressure 115/69, pulse 100, temperature 98.6 F (37 C), temperature source Oral, resp. rate 18, height 5\' 9"  (1.753 m), weight 428 lb 6.4 oz (194.321 kg), SpO2 96 %. General: Well developed, morbidly obese AAM in no acute distress. Head: Normocephalic, atraumatic, sclera non-icteric, no xanthomas, nares are without discharge. Neck: JVP not elevated. Lungs: Diminished at bases, otherwise clear bilaterally to auscultation without wheezes, rales, or rhonchi. Breathing is unlabored. Heart: RRR - slightly elevated rate - S1 S2 without murmurs, rubs, or gallops.  Abdomen: Soft, non-tender, non-distended with normoactive bowel sounds. No rebound/guarding. Extremities: No clubbing or cyanosis. 2+ chronic appearing edema with venous stasis changes.  Neuro: Alert and oriented X 3. Moves all extremities spontaneously. Psych:  Responds to questions appropriately with a normal affect.   Intake/Output Summary (Last 24 hours) at 07/21/14 0813 Last data filed at 07/20/14 1914  Gross per 24 hour  Intake    620 ml  Output   1150 ml  Net   -530 ml    Inpatient Medications:  . allopurinol  200 mg Oral Daily  . aspirin EC  81 mg Oral Daily  . ciprofloxacin  500 mg Oral BID  . metoprolol tartrate  25 mg Oral BID  . simvastatin  40 mg Oral q1800   Infusions:  . sodium chloride 50 mL/hr at 07/20/14 0328    Labs:  Recent Labs  07/20/14 0055  NA 137  K 3.6  CL 101  CO2 26  GLUCOSE 116*  BUN 21  CREATININE 1.09  CALCIUM 8.6  MG 2.1   No results for input(s): AST, ALT, ALKPHOS, BILITOT, PROT, ALBUMIN in the last 72 hours.  Recent Labs  07/20/14 0055 07/20/14 0540 07/21/14 0328  WBC 13.2* 13.1* 11.7*  NEUTROABS 9.6*  --   --   HGB 12.1* 10.9* 11.6*    HCT 35.5* 33.3* 35.3*  MCV 88.8 87.4 89.4  PLT 260 265 290    Recent Labs  07/20/14 0055 07/20/14 0540 07/20/14 1300  TROPONINI 2.24* 2.36* 1.60*   Invalid input(s): POCBNP No results for input(s): HGBA1C in the last 72 hours.   Radiology/Studies:  See h/p.   Assessment and Plan  1. Newly recognized AF RVR - converted to NSR spontaneously. No further AF on telemetry. CHADSVASC can be interpreted in variable ways - 1 definitely for HTN, 2 if you count diastolic dysfunction, and 3 if you count the nonobstructive CAD although this is minimal. Will discuss anticoag plans with MD. Continue metoprolol BID.  2. Chest pain with mild nononbstructive CAD by cath 07/20/14 - elevated troponin felt likely due to demand ischemia - however, he remains sinus tach since admission and per H/P was on heparin for r/o PE as well. No studies performed either here or [place]. D-dimer was elevated in 2014 so not sure how useful it would be to repeat this. Peak troponin 4.5 at [facility] so a little higher than what I would anticipate purely from demand ischemia from AF. Will discuss further eval with MD. Check lipids in AM if he is still here - he was started on Zocor 40 here but I will change to Lipitor in case he requires diltiazem in the future.  3. Diastolic dysfunction by echo/cath - pBNP reportedly 18k at  [place] but CXR was clear. Volume status difficult to assess given habitus. Depending on eval for PE, consider low dose Lasix initiation to see if it helps with his dyspnea.  3. Morbid obesity s/p gastric sleeve - per report has lost 130lbs in the past. Further weight loss will be of utmost importance. He has appts with bariatrics for the next 5 years.  4. OSA, with sleep apnea with mild pulm HTN likely 2/2 this - continue CPAP.  4. HTN - controlled.   5. UTI - Ucx pending. Will cap at 7 days treatment (last dose 1/26 PM).  Signed, Melina Copa PA-C

## 2014-07-21 NOTE — Progress Notes (Addendum)
Received call from radiology that patient has multiple small lower lobe PEs and just barely meets criteria for right heart strain by ratio. Study also significant for moderate pleural effusions. Note initial pBNP at [facility] was 18k and patient received IV fluids there; elevated LVEDP by cath yesterday.   Discussed findings with Dr. Debara Pickett. Will plan to start Xarelto with PE dosing (will order pharmacy consult for dosing and education), and give IV Lasix 40mg  x1. Will also give additional potassium supplementation. Appreciate care management assistance with ensuring availability of Xarelto. Per prior discussion with Dr. Debara Pickett will d/c ASA since no obstructive CAD. We do not think a hypercoag panel would change management at this point in time as he will require long-term anticoag for PAF anyway. Will check LE duplex to eval for DVT.   Anticipate DC in AM if SOB improved. Will also ask for PT eval to determine if he needs any mobility equipment at home and to assess for any ambulatory desaturations. Neya Creegan PA-C    I was notified just now that the patient went back into AF RVR rates 120s. (Baseline HR when he was in sinus tach was about 105 so this is not a marked rate change.) BP preserved by recent check. D/w MD. This is likely driven by his overall situation (PE, pleural effusions) - will attempt rate control since he is asymptomatic. Will start with diltiazem 30mg  q6hr. Cloee Dunwoody PA-C

## 2014-07-21 NOTE — Progress Notes (Signed)
Gave 10mg  iv cardizem, HR sustaining 130-160's, will continue to monitor.

## 2014-07-21 NOTE — Progress Notes (Addendum)
PT Cancellation Note  Patient Details Name: Colin Hall. MRN: [id number] DOB: [birth year]   Cancelled Treatment:    Reason Eval/Treat Not Completed: Patient not medically ready Holding PT evaluation as pt with new multiple lower lobe pulmonary emboli. Pt also hypokalemic. Will await until pt is therapeutic prior to mobilizing. To start Xarelto for PE dosing today. No Heparin given today. Pt with elevated HR at rest to 155 bpm. Will follow up next AM as appropriate.   Candy Sledge A 07/21/2014, 3:13 PM  Candy Sledge, [place], DPT [phone]

## 2014-07-21 NOTE — Discharge Instructions (Addendum)
Information on my medicine - XARELTO (rivaroxaban)  This medication education was reviewed with me or my healthcare representative as part of my discharge preparation.  The pharmacist that spoke with me during my hospital stay was:  [facility], Margot Chimes, [place]? Xarelto was prescribed to treat blood clots that may have been found in the veins of your legs (deep vein thrombosis) or in your lungs (pulmonary embolism) and to reduce the risk of them occurring again.  What do you need to know about Xarelto? The starting dose is one 15 mg tablet taken TWICE daily with food for the FIRST 21 DAYS then on 08/11/14  the dose is changed to one 20 mg tablet taken ONCE A DAY with your evening meal.  DO NOT stop taking Xarelto without talking to the health care provider who prescribed the medication.  Refill your prescription for 20 mg tablets before you run out.  After discharge, you should have regular check-up appointments with your healthcare provider that is prescribing your Xarelto.  In the future your dose may need to be changed if your kidney function changes by a significant amount.  What do you do if you miss a dose? If you are taking Xarelto TWICE DAILY and you miss a dose, take it as soon as you remember. You may take two 15 mg tablets (total 30 mg) at the same time then resume your regularly scheduled 15 mg twice daily the next day.  If you are taking Xarelto ONCE DAILY and you miss a dose, take it as soon as you remember on the same day then continue your regularly scheduled once daily regimen the next day. Do not take two doses of Xarelto at the same time.   Important Safety Information Xarelto is a blood thinner medicine that can cause bleeding. You should call your healthcare provider right away if you experience any of the following: ? Bleeding from an injury or your nose that does not stop. ? Unusual colored urine (red or dark brown) or  unusual colored stools (red or black). ? Unusual bruising for unknown reasons. ? A serious fall or if you hit your head (even if there is no bleeding).  Some medicines may interact with Xarelto and might increase your risk of bleeding while on Xarelto. To help avoid this, consult your healthcare provider or pharmacist prior to using any new prescription or non-prescription medications, including herbals, vitamins, non-steroidal anti-inflammatory drugs (NSAIDs) and supplements.  This website has more information on Xarelto: [email or web address]-benson.[email or web address]    Radial Site Care Refer to this sheet in the next few weeks. These instructions provide you with information on caring for yourself after your procedure. Your caregiver may also give you more specific instructions. Your treatment has been planned according to current medical practices, but problems sometimes occur. Call your caregiver if you have any problems or questions after your procedure. HOME CARE INSTRUCTIONS  You may shower the day after the procedure.Remove the bandage (dressing) and gently wash the site with plain soap and water.Gently pat the site dry.  Do not apply powder or lotion to the site.  Do not submerge the affected site in water for 3 to 5 days.  Inspect the site at least twice daily.  Do not flex or bend the affected arm for 24 hours.  No lifting over 5 pounds (2.3 kg) for 5 days after your procedure.  Do not drive home if you are discharged the same day of the  procedure. Have someone else drive you.  You may drive 24 hours after the procedure unless otherwise instructed by your caregiver.  Do not operate machinery or power tools for 24 hours.  A responsible adult should be with you for the first 24 hours after you arrive home. What to expect:  Any bruising will usually fade within 1 to 2 weeks.  Blood that collects in the tissue (hematoma) may be painful to the touch. It should usually decrease in size and  tenderness within 1 to 2 weeks. SEEK IMMEDIATE MEDICAL CARE IF:  You have unusual pain at the radial site.  You have redness, warmth, swelling, or pain at the radial site.  You have drainage (other than a small amount of blood on the dressing).  You have chills.  You have a fever or persistent symptoms for more than 72 hours.  You have a fever and your symptoms suddenly get worse.  Your arm becomes pale, cool, tingly, or numb.  You have heavy bleeding from the site. Hold pressure on the site. Document Released: 07/20/2010 Document Revised: 09/09/2011 Document Reviewed: 07/20/2010 [employer] [facility] Patient Information 2015 [place], [place]. This information is not intended to replace advice given to you by your health care provider. Make sure you discuss any questions you have with your health care provider.

## 2014-07-21 NOTE — Progress Notes (Signed)
Pt converted to Afib from NSR, pt asymptomatic, MD notified, new orders given, will continue to monitor.

## 2014-07-21 NOTE — Progress Notes (Signed)
ANTICOAGULATION CONSULT NOTE - Follow Up Consult  Pharmacy Consult for Xarelto Indication: atrial fibrillation and pulmonary embolus  No Known Allergies  Patient Measurements: Height: 5\' 9"  (175.3 cm) Weight: (!) 428 lb 6.4 oz (194.321 kg) IBW/kg (Calculated) : 70.7 Heparin Dosing Weight: 120 kg  Vital Signs: Temp: 98.6 F (37 C) (01/21 0515) Temp Source: Oral (01/21 0515) BP: 115/69 mmHg (01/21 0515) Pulse Rate: 100 (01/21 0515)  Labs:  Recent Labs  07/20/14 0055 07/20/14 0455 07/20/14 0540 07/20/14 1300 07/21/14 0328 07/21/14 0918  HGB 12.1*  --  10.9*  --  11.6*  --   HCT 35.5*  --  33.3*  --  35.3*  --   PLT 260  --  265  --  290  --   APTT 37  --   --   --   --   --   LABPROT 15.6*  --   --   --   --   --   INR 1.22  --   --   --   --   --   HEPARINUNFRC  --  <0.10*  --  0.11*  --   --   CREATININE 1.09  --   --   --   --  0.77  TROPONINI 2.24*  --  2.36* 1.60*  --   --     Estimated Creatinine Clearance: 179.3 mL/min (by C-G formula based on Cr of 0.77).  Assessment: 54 y/o male who presented to [facility] with fatigue and SOB found to be in Afib w/ RVR and positive troponin. He was transferred to [facility] for management. He is s/p cath with finding of nonobstructive CAD. Plan was to start Xarelto for Afib however CT angio shows multiple lower lobe PE's. He is still to start Xarelto but dosing will be different for PE's. Renal function is normal. CBC is stable.  Goal of Therapy:  Therapeutic anticoagulation Monitor platelets by anticoagulation protocol: Yes   Plan:  - Xarelto 15 mg PO bid with meals for 21 days then 20 mg PO daily with supper - Monitor for s/sx of bleeding  [facility], [place].D., BCPS Clinical Pharmacist Pager: [phone] 07/21/2014 1:41 PM

## 2014-07-22 DIAGNOSIS — I248 Other forms of acute ischemic heart disease: Secondary | ICD-10-CM

## 2014-07-22 DIAGNOSIS — I2699 Other pulmonary embolism without acute cor pulmonale: Secondary | ICD-10-CM

## 2014-07-22 LAB — CBC
HCT: 37.6 % — ABNORMAL LOW (ref 39.0–52.0)
Hemoglobin: 12.2 g/dL — ABNORMAL LOW (ref 13.0–17.0)
MCH: 29.3 pg (ref 26.0–34.0)
MCHC: 32.4 g/dL (ref 30.0–36.0)
MCV: 90.2 fL (ref 78.0–100.0)
Platelets: 296 10*3/uL (ref 150–400)
RBC: 4.17 MIL/uL — AB (ref 4.22–5.81)
RDW: 15.1 % (ref 11.5–15.5)
WBC: 12.6 10*3/uL — AB (ref 4.0–10.5)

## 2014-07-22 LAB — LIPID PANEL
CHOL/HDL RATIO: 3.8 ratio
CHOLESTEROL: 114 mg/dL (ref 0–200)
HDL: 30 mg/dL — ABNORMAL LOW (ref >39)
LDL Cholesterol: 75 mg/dL (ref 0–99)
Triglycerides: 43 mg/dL (ref <150)
VLDL: 9 mg/dL (ref 0–40)

## 2014-07-22 LAB — BASIC METABOLIC PANEL
ANION GAP: 10 (ref 5–15)
BUN: 13 mg/dL (ref 6–23)
CO2: 26 mmol/L (ref 19–32)
Calcium: 8.6 mg/dL (ref 8.4–10.5)
Chloride: 104 mEq/L (ref 96–112)
Creatinine, Ser: 0.88 mg/dL (ref 0.50–1.35)
GFR calc Af Amer: >90 mL/min (ref >90)
Glucose, Bld: 100 mg/dL — ABNORMAL HIGH (ref 70–99)
Potassium: 3.9 mmol/L (ref 3.5–5.1)
Sodium: 140 mmol/L (ref 135–145)

## 2014-07-22 MED ORDER — DILTIAZEM HCL ER 120 MG PO CP24: 120.0000 mg | ORAL_CAPSULE | Freq: Every day | ORAL | Status: DC

## 2014-07-22 MED ORDER — FUROSEMIDE 20 MG PO TABS
20.0000 mg | ORAL_TABLET | Freq: Every day | ORAL | Status: DC
  Administered 2014-07-22: 20 mg via ORAL
  Filled 2014-07-22: qty 1

## 2014-07-22 MED ORDER — FUROSEMIDE 20 MG PO TABS: 20.0000 mg | ORAL_TABLET | Freq: Every day | ORAL | Status: DC

## 2014-07-22 MED ORDER — RIVAROXABAN (XARELTO) VTE STARTER PACK (15 & 20 MG): ORAL_TABLET | ORAL | Status: DC

## 2014-07-22 MED ORDER — METOPROLOL TARTRATE 25 MG PO TABS: 25.0000 mg | ORAL_TABLET | Freq: Two times a day (BID) | ORAL | Status: DC

## 2014-07-22 MED ORDER — DILTIAZEM HCL ER 120 MG PO CP24
120.0000 mg | ORAL_CAPSULE | Freq: Every day | ORAL | Status: DC
  Administered 2014-07-22: 120 mg via ORAL
  Filled 2014-07-22 (×2): qty 1

## 2014-07-22 MED ORDER — RIVAROXABAN 20 MG PO TABS: 20.0000 mg | ORAL_TABLET | Freq: Every day | ORAL | Status: DC

## 2014-07-22 NOTE — Progress Notes (Addendum)
VASCULAR LAB PRELIMINARY  PRELIMINARY  PRELIMINARY  PRELIMINARY  Bilateral lower extremity venous duplex completed.    Preliminary report:  Bilateral:  No obvious evidence of DVT, superficial thrombosis, or Baker's Cyst.   Jaskirat Schwieger, RVS 07/22/2014, 2:05 PM

## 2014-07-22 NOTE — Progress Notes (Signed)
Subjective: No complaints.  Slept well.  Objective: Vital signs in last 24 hours: Temp:  [97.5 F (36.4 C)-99.9 F (37.7 C)] 97.5 F (36.4 C) (01/22 0400) Pulse Rate:  [68-93] 87 (01/22 0400) Resp:  [18] 18 (01/21 1948) BP: (114-138)/(61-86) 128/79 mmHg (01/22 0400) SpO2:  [93 %-99 %] 96 % (01/22 0400) Weight:  [429 lb 4.8 oz (194.729 kg)] 429 lb 4.8 oz (194.729 kg) (01/22 0400) Last BM Date: 07/19/14  Intake/Output from previous day: 01/21 0701 - 01/22 0700 In: 240 [P.O.:240] Out: 800 [Urine:800] Intake/Output this shift:    Medications Current Facility-Administered Medications  Medication Dose Route Frequency Provider Last Rate Last Dose  . acetaminophen (TYLENOL) tablet 650 mg  650 mg Oral Q4H PRN Cletus Gash, MD      . allopurinol (ZYLOPRIM) tablet 200 mg  200 mg Oral Daily Cletus Gash, MD   200 mg at 07/21/14 0959  . atorvastatin (LIPITOR) tablet 20 mg  20 mg Oral q1800 Dayna N Dunn, PA-C   20 mg at 07/21/14 1709  . ciprofloxacin (CIPRO) tablet 500 mg  500 mg Oral BID Dayna N Dunn, PA-C   500 mg at 07/21/14 1956  . diltiazem (CARDIZEM) tablet 30 mg  30 mg Oral 4 times per day Charlie Pitter, PA-C   30 mg at 07/22/14 0547  . metoprolol tartrate (LOPRESSOR) tablet 25 mg  25 mg Oral BID Cletus Gash, MD   25 mg at 07/21/14 2107  . nitroGLYCERIN (NITROSTAT) SL tablet 0.4 mg  0.4 mg Sublingual Q5 Min x 3 PRN Cletus Gash, MD      . ondansetron [facility]) injection 4 mg  4 mg Intravenous Q6H PRN Cletus Gash, MD      . Rivaroxaban Alveda Reasons) tablet 15 mg  15 mg Oral BID WC [facility], RPH   15 mg at 07/21/14 1709    PE: General appearance: alert, cooperative and no distress Lungs: clear to auscultation bilaterally and Decreased airflow. No wheezing Heart: regular rate and rhythm, S1, S2 normal, no murmur, click, rub or gallop Extremities: No obvious edema Pulses: 2+ radials Skin: Warm and dry Neurologic: Grossly normal  Lab Results:    Recent Labs  07/20/14 0540 07/21/14 0328 07/22/14 0313  WBC 13.1* 11.7* 12.6*  HGB 10.9* 11.6* 12.2*  HCT 33.3* 35.3* 37.6*  PLT 265 290 296   BMET  Recent Labs  07/20/14 0055 07/21/14 0918 07/22/14 0313  NA 137 136 140  K 3.6 3.3* 3.9  CL 101 103 104  CO2 26 25 26   GLUCOSE 116* 174* 100*  BUN 21 12 13   CREATININE 1.09 0.77 0.88  CALCIUM 8.6 8.3* 8.6   PT/INR  Recent Labs  07/20/14 0055  LABPROT 15.6*  INR 1.22   Cholesterol  Recent Labs  07/22/14 0330  CHOL 114   Cardiac Panel (last 3 results)  Recent Labs  07/20/14 0055 07/20/14 0540 07/20/14 1300  TROPONINI 2.24* 2.36* 1.60*     Assessment/Plan     Atrial fibrillation with rapid ventricular response Maintaining NSR on diltiazem 30 and lopressor 25. On Xarelto.    Morbid obesity,   SP banding.  Has lost 130#   UTI (urinary tract infection)  Cipro.  Culture negative thus far   Demand ischemia   HTN (hypertension)   Controlled and stable.    OSA on CPAP   Acute pulmonary embolism  On Xarelto. LE venous dopplers pending   Acute diastolic CHF (congestive heart failure)  Net fluids: -0.6L/-1.2L.  Consider low dose diuretic at DC.  Weight monitoring might be an issue.    Bilateral pleural effusion   Mild CAD SP LHC revealing mild nonobstructive CAD. Normal LVEF.  Mderately elevated left ventricular end-diastolic pressure  Disposition:  He is being evaluated by PT now.  LE venous dopplers pending.  Results shouldn't change anything.  Primary cardiologist can follow up.  DC home today.     LOS: 3 days    Shamond Skelton PA-C 07/22/2014 7:37 AM

## 2014-07-22 NOTE — Evaluation (Signed)
Physical Therapy Evaluation Patient Details Name: Colin Hall. MRN: [id number] DOB: 1960-11-02 Today's Date: 07/22/2014   History of Present Illness  Pt admit with afib with RVR.  Also with multiple PE's.  hypokalemia.  Clinical Impression  Pt admitted with above diagnosis. Pt currently with functional limitations due to the deficits listed below (see PT Problem List). Pt ambulating well with good balance.  Desats unless pursed lip breathing.  Will continue to follow to work on endurance.   Pt will benefit from skilled PT to increase their independence and safety with mobility to allow discharge to the venue listed below.      Follow Up Recommendations No PT follow up    Equipment Recommendations  None recommended by PT    Recommendations for Other Services       Precautions / Restrictions Precautions Precautions: Fall Restrictions Weight Bearing Restrictions: No      Mobility  Bed Mobility Overal bed mobility: Independent                Transfers Overall transfer level: Independent                  Ambulation/Gait Ambulation/Gait assistance: Supervision Ambulation Distance (Feet): 350 Feet Assistive device: None Gait Pattern/deviations: Step-through pattern;Decreased stride length;Wide base of support   Gait velocity interpretation: <1.8 ft/sec, indicative of risk for recurrent falls General Gait Details: Pt able to ambulate without device without difficulty.  No LOB with safe gait.  Pt desat to 85% but could incr O2 with pursed lip breathing.    Stairs            Wheelchair Mobility    Modified Rankin (Stroke Patients Only)       Balance Overall balance assessment: Needs assistance         Standing balance support: No upper extremity supported;During functional activity Standing balance-Leahy Scale: Fair               High level balance activites: Direction changes;Turns;Sudden stops High Level Balance Comments: Does above  without device without LOB.               Pertinent Vitals/Pain Pain Assessment: No/denies pain  O2 92% on RA at rest.  With ambulation, pt desat to 85% but could keep sat 93% and above with purse lip breathing.  Pt aware.  DOE 2/4.     Home Living Family/patient expects to be discharged to:: Private residence Living Arrangements: Non-relatives/Friends (godson who works) Available Help at Discharge: Friend(s);Available PRN/intermittently (always has someone there at night) Type of Home: House Home Access: Stairs to enter Entrance Stairs-Rails: Right Entrance Stairs-Number of Steps: 2 Home Layout: One level Home Equipment: Bedside commode;Cane - quad      Prior Function Level of Independence: Independent               Hand Dominance   Dominant Hand: Right    Extremity/Trunk Assessment   Upper Extremity Assessment: Defer to OT evaluation           Lower Extremity Assessment: Generalized weakness      Cervical / Trunk Assessment: Normal  Communication   Communication: No difficulties  Cognition Arousal/Alertness: Awake/alert Behavior During Therapy: WFL for tasks assessed/performed Overall Cognitive Status: Within Functional Limits for tasks assessed                      General Comments      Exercises  Assessment/Plan    PT Assessment Patient needs continued PT services  PT Diagnosis Generalized weakness   PT Problem List Decreased activity tolerance;Decreased balance;Decreased mobility;Decreased knowledge of use of DME;Decreased safety awareness;Decreased knowledge of precautions  PT Treatment Interventions DME instruction;Gait training;Functional mobility training;Therapeutic activities;Stair training;Therapeutic exercise;Balance training;Patient/family education   PT Goals (Current goals can be found in the Care Plan section) Acute Rehab PT Goals Patient Stated Goal: to go home PT Goal Formulation: With patient Time For Goal  Achievement: 07/29/14 Potential to Achieve Goals: Good    Frequency Min 3X/week   Barriers to discharge Decreased caregiver support      Co-evaluation               End of Session Equipment Utilized During Treatment: Gait belt Activity Tolerance: Patient limited by fatigue Patient left: in chair;with call bell/phone within reach Nurse Communication: Mobility status         Time: [id number] PT Time Calculation (min) (ACUTE ONLY): 21 min   Charges:   PT Evaluation $Initial PT Evaluation Tier I: 1 Procedure     PT G CodesDenice Paradise 08-12-2014, 8:35 AM Amanda Cockayne Acute Rehabilitation [phone] (pager)

## 2014-07-22 NOTE — Discharge Summary (Signed)
Physician Discharge Summary    Cardiologist:  In [place]  Patient ID: Colin Hall. MRN: [id number] DOB/AGE: 1960/07/07 54 y.o.  Admit date: 07/19/2014 Discharge date: 07/22/2014  Admission Diagnoses:  Afib RVR  Discharge Diagnoses:  Principal Problem:   Atrial fibrillation with rapid ventricular response Active Problems:   Morbid obesity   UTI (urinary tract infection)   Demand ischemia   HTN (hypertension)   OSA on CPAP   Acute pulmonary embolism   Acute diastolic CHF (congestive heart failure)   Bilateral pleural effusion   Mild CAD   Hypokalemia  Discharged Condition: stable  Hospital Course:  54 y.o. male w/ PMHx significant for severe obesity s/p gastric sleeve, sleep apnea, HTN who presented to initially to [facility] with easy fatigue and shortness of breath. Was found to be in afib with RVR and had an elevated troponin. Due to difficulty in maintaining his rate and the elevated troponin, he was subsequently transported to [facility] on 07/19/2014.   He reports last feeling at baseline a week ago. Since then, has had nonspecific complaints of neck pain (?slept on it wrong), increased daytime sleepiness, intermittent diaphoresis. Due to these nonspecific issues, he made an appointment with his PCP today and while getting ready, he felt drastically fatigued and short of breath. Showering was a significant effort and he had trouble even getting dress. Felt lightheaded. These tasks were so difficult, he was 45 minutes late to Drs appointment. Sent to ER due to symptoms.  At [facility], was found to be in afib with RVR. While at [facility], was seen by cardiology and was given aspirin, digoxin 0.5 mg x 1, diltizem 20, metoprolol 2.5 x 3, heparin gtt, NLS 3 l bolues.  Pt denies PND, LE edema. States he maybe had some chest tightness a couple of days ago, short and brief when he laid down for bed. Nonpleuritic. Has not recurred and not present today. Chronic  venous stasis in legs, denies any recent swelling or pain. No history of blood clots. No FH of cardiac dz. Nondrinker, nonsmoker.  Had cath done at [facility] point 03/2013, report in paper chart which reports normal coronary arteries, nl LV function.  The patient was admitted and started on aspririn, heparin, statin, beta blocker.  He converted to NSR.  Troponin increased to 2.36.  He was taken to the cath lab and the procedure revealed mild nonobstructive coronary artery disease.  Low normal LV systolic function with moderately elevated left ventricular end-diastolic pressure.   ASA was stopped and he was given a dose on IV lasix and started on 20mg  PO daily.  Echo revealed normal LV function and G2DD.  CT angio was completed and revealed multiple lower lobe pulmonary emboli without central vessel pulmonary embolus appreciable.  He was started on Xarelto 15mg  BID for 21 days and then will start 20mg  daily.  The prescription was filled at the outpatient pharmacy before he left.  This will be long term.  Potassium was replaced. He had a reoccurrence of Afib and was started on diltiazem 30mg  Q6hr.  Venous dopplers revealed No obvious DVT, superficial thrombosis, or Baker's Cyst.  The patient was seen by Dr. Debara Pickett who felt he was stable for DC home.   Consults: PT  Significant Diagnostic Studies:  Lipid Panel     Component Value Date/Time   CHOL 114 07/22/2014 0330   TRIG 43 07/22/2014 0330   HDL 30* 07/22/2014 0330   CHOLHDL 3.8 07/22/2014 0330   VLDL  9 07/22/2014 0330   LDLCALC 75 07/22/2014 0330     CT ANGIOGRAPHY CHEST WITH CONTRAST  TECHNIQUE: Multidetector CT imaging of the chest was performed using the standard protocol during bolus administration of intravenous contrast. Multiplanar CT image reconstructions and MIPs were obtained to evaluate the vascular anatomy.  CONTRAST: 185mL OMNIPAQUE IOHEXOL 350 MG/ML SOLN  COMPARISON: Chest radiograph July 19, 2014; chest CT January 29, 2013.  FINDINGS: There are multiple pulmonary emboli and lower lobe pulmonary arteries. There is no central pulmonary embolus seen. The right ventricle to left ventricle diameter ratio is 0.95 which does meet criteria for right heart strain.  There are moderate pleural effusions bilaterally with bibasilar lung atelectatic change. Lungs otherwise are clear. There is no apparent thoracic adenopathy. Pericardium is not thickened.  There is left ventricular hypertrophy. There are foci of coronary artery calcification.  In the visualized upper abdomen, no lesion is appreciable. There is degenerative change in the thoracic spine. There are no blastic or lytic bone lesions appreciable. Thyroid appears unremarkable.  Review of the MIP images confirms the above findings.  IMPRESSION: Multiple lower lobe pulmonary emboli without central vessel pulmonary embolus appreciable. Patient meets criteria for right heart strain.  Moderate pleural effusions bilaterally with bibasilar atelectatic change.  Coronary artery calcification. Left ventricular hypertrophy.  No adenopathy.  Echo Study Conclusions  - Left ventricle: The cavity size was normal. Wall thickness was increased in a pattern of mild LVH. Septal bounce noted. The estimated ejection fraction was 55%. Although no diagnostic regional wall motion abnormality was identified, this possibility cannot be completely excluded on the basis of this study. Features are consistent with a pseudonormal left ventricular filling pattern, with concomitant abnormal relaxation and increased filling pressure (grade 2 diastolic dysfunction). - Aortic valve: There was no stenosis. - Mitral valve: There was no significant regurgitation. - Left atrium: The atrium was mildly dilated. - Right ventricle: The cavity size was normal. Systolic function was normal. - Right atrium: The atrium was mildly dilated. - Tricuspid  valve: Peak RV-RA gradient (S): 33 mm Hg. - Pulmonary arteries: PA peak pressure: 41 mm Hg (S). - Systemic veins: IVC measured 2.2 cm with < 50% respirophasic variation, suggesting RA pressure 8 mmHg.  Impressions:  - Normal LV size with mild LV hypertrophy. EF 55%. Septal bounce noted. Moderate diastolic dysfunction. Normal RV size and systolic function. Mild biatrial enlargement. Mild pulmonary hypertension.   Cardiac Catheterization Procedure Note  Name: Colin Hall. MRN: [id number] DOB: 14-Feb-1961  Procedure: Left Heart Cath, Selective Coronary Angiography, LV angiography  Indication: NSTEMI  Medications: Sedation: 2 mg IV Versed, 25 mcg IV Fentanyl Contrast: 100 ml Omnipaque  Procedural Details: The right wrist was prepped, draped, and anesthetized with 1% lidocaine. Using the modified Seldinger technique, a 5 French sheath was introduced into the right radial artery. 3 mg of verapamil was administered through the sheath, weight-based unfractionated heparin was administered intravenously. A Jackie catheter was used for selective coronary angiography. A pigtail catheter was used for left ventriculography. A 3 DRC catheter was used to engage the right coronary artery Catheter exchanges were performed over an exchange length guidewire. There were no immediate procedural complications. A TR band was used for radial hemostasis at the completion of the procedure. The patient was transferred to the post catheterization recovery area for further monitoring.  Procedural Findings:  Hemodynamics: AO: 112/80 mmHg LV: 115/16 mmHg LVEDP: 24 mmHg  Coronary angiography: Coronary dominance: Right   Left Main: Normal  Left Anterior Descending (LAD): Normal in size with minor irregularities.  1st diagonal (D1): Large in size with minor irregularities.  2nd diagonal (D2): Normal in  size with minor irregularities.  3rd diagonal (D3): Very small in size.  Circumflex (LCx): Normal  1st obtuse marginal: Minor irregularities  2nd obtuse marginal: Medium in size with minor irregularities.  3rd obtuse marginal: Medium in size with minor irregularities.   Right Coronary Artery: Very large in size with minor irregularities. There is 20% stenosis in the midsegment.  Posterior descending artery: Large in size with minor irregularities.  Posterior AV segment: Normal in size with minor irregularities.  Posterolateral branchs: Minor irregularities.  Left ventriculography: Left ventricular systolic function is normal , LVEF is estimated at 50-55%  Final Conclusions:  1. Mild nonobstructive coronary artery disease. 2. Low normal LV systolic function with moderately elevated left ventricular end-diastolic pressure  Recommendations:  Elevated cardiac enzymes is likely due to supply demand ischemia. Continue treatment for atrial fibrillation. The patient likely has diastolic dysfunction and underlying sleep apnea.  Kathlyn Sacramento MD, [facility] 07/20/2014  Treatments: See above  Discharge Exam: Blood pressure 128/79, pulse 87, temperature 97.5 F (36.4 C), temperature source Oral, resp. rate 18, height 5\' 9"  (1.753 m), weight 429 lb 4.8 oz (194.729 kg), SpO2 96 %.   Disposition: 01-Home or Self Care      Discharge Instructions    Diet - low sodium heart healthy    Complete by:  As directed      Increase activity slowly    Complete by:  As directed             Medication List    STOP taking these medications        labetalol 200 MG tablet  Commonly known as:  NORMODYNE      TAKE these medications        acetaminophen 500 MG tablet  Commonly known as:  TYLENOL  Take 1,000 mg by mouth every 6 (six) hours as needed (pain).     allopurinol 100 MG tablet  Commonly known as:  ZYLOPRIM  Take 200 mg by mouth every morning.     CAL-CITRATE  PO  Take 1 tablet by mouth every morning. chewable     diltiazem 120 MG 24 hr capsule  Commonly known as:  DILACOR XR  Take 1 capsule (120 mg total) by mouth daily.     furosemide 20 MG tablet  Commonly known as:  LASIX  Take 1 tablet (20 mg total) by mouth daily.     metoprolol tartrate 25 MG tablet  Commonly known as:  LOPRESSOR  Take 1 tablet (25 mg total) by mouth 2 (two) times daily.     mupirocin ointment 2 %  Commonly known as:  BACTROBAN  Apply 1 application topically every evening. Apply to stomach     ondansetron 4 MG disintegrating tablet  Commonly known as:  ZOFRAN ODT  Take 1 tablet (4 mg total) by mouth every 8 (eight) hours as needed for nausea.     OVER THE COUNTER MEDICATION  Take 1 tablet by mouth every morning. Bariatric chewable multivitamin     oxyCODONE-acetaminophen 5-325 MG per tablet  Commonly known as:  PERCOCET/ROXICET  Take 2 tablets by mouth every 4 (four) hours as needed for pain.     Rivaroxaban 15 & 20 MG Tbpk  Commonly known as:  XARELTO STARTER PACK  Take as directed on package: Start with one 15mg  tablet by mouth twice  a day with food. On Day 22, switch to one 20mg  tablet once a day with food.     rivaroxaban 20 MG Tabs tablet  Commonly known as:  XARELTO  Take 1 tablet (20 mg total) by mouth daily with supper. Start this prescription after you finish the Xarelto starter pack.     tamsulosin 0.4 MG Caps capsule  Commonly known as:  FLOMAX  Take 1 capsule (0.4 mg total) by mouth 2 (two) times daily.       Follow-up Information    Follow up with Cardiologist in [place].     Greater than 30 minutes was spent completing the patient's discharge.    SignedTarri Fuller, PAC 07/22/2014, 1:32 PM

## 2014-07-23 ENCOUNTER — Other Ambulatory Visit: Payer: Self-pay | Admitting: Physician Assistant

## 2014-07-23 MED ORDER — DILTIAZEM HCL ER 120 MG PO CP24: 120.0000 mg | ORAL_CAPSULE | Freq: Every day | ORAL | Status: DC

## 2014-07-23 MED ORDER — FUROSEMIDE 20 MG PO TABS: 20.0000 mg | ORAL_TABLET | Freq: Every day | ORAL | Status: DC

## 2014-07-23 MED ORDER — METOPROLOL TARTRATE 25 MG PO TABS: 25.0000 mg | ORAL_TABLET | Freq: Two times a day (BID) | ORAL | Status: AC

## 2015-01-14 DIAGNOSIS — I5032 Chronic diastolic (congestive) heart failure: Secondary | ICD-10-CM | POA: Insufficient documentation

## 2015-01-14 DIAGNOSIS — Z7901 Long term (current) use of anticoagulants: Secondary | ICD-10-CM

## 2015-01-14 DIAGNOSIS — I480 Paroxysmal atrial fibrillation: Secondary | ICD-10-CM | POA: Insufficient documentation

## 2015-01-14 DIAGNOSIS — I2510 Atherosclerotic heart disease of native coronary artery without angina pectoris: Secondary | ICD-10-CM

## 2015-01-14 HISTORY — DX: Paroxysmal atrial fibrillation: I48.0

## 2015-01-14 HISTORY — DX: Chronic diastolic (congestive) heart failure: I50.32

## 2015-01-14 HISTORY — DX: Long term (current) use of anticoagulants: Z79.01

## 2015-01-14 HISTORY — DX: Atherosclerotic heart disease of native coronary artery without angina pectoris: I25.10

## 2015-08-08 ENCOUNTER — Other Ambulatory Visit: Payer: Self-pay | Admitting: Physician Assistant

## 2015-10-28 LAB — POCT GLYCOSYLATED HEMOGLOBIN (HGB A1C): Hemoglobin A1C: 5.4

## 2015-12-25 DIAGNOSIS — H409 Unspecified glaucoma: Secondary | ICD-10-CM | POA: Insufficient documentation

## 2015-12-25 DIAGNOSIS — Z6841 Body Mass Index (BMI) 40.0 and over, adult: Secondary | ICD-10-CM | POA: Insufficient documentation

## 2015-12-25 DIAGNOSIS — Z79899 Other long term (current) drug therapy: Secondary | ICD-10-CM | POA: Insufficient documentation

## 2015-12-25 DIAGNOSIS — M51379 Other intervertebral disc degeneration, lumbosacral region without mention of lumbar back pain or lower extremity pain: Secondary | ICD-10-CM

## 2015-12-25 DIAGNOSIS — R7303 Prediabetes: Secondary | ICD-10-CM

## 2015-12-25 DIAGNOSIS — E538 Deficiency of other specified B group vitamins: Secondary | ICD-10-CM

## 2015-12-25 DIAGNOSIS — R5381 Other malaise: Secondary | ICD-10-CM

## 2015-12-25 DIAGNOSIS — R609 Edema, unspecified: Secondary | ICD-10-CM | POA: Insufficient documentation

## 2015-12-25 DIAGNOSIS — R5383 Other fatigue: Secondary | ICD-10-CM

## 2015-12-25 DIAGNOSIS — D638 Anemia in other chronic diseases classified elsewhere: Secondary | ICD-10-CM

## 2015-12-25 DIAGNOSIS — M159 Polyosteoarthritis, unspecified: Secondary | ICD-10-CM

## 2015-12-25 DIAGNOSIS — E782 Mixed hyperlipidemia: Secondary | ICD-10-CM

## 2015-12-25 DIAGNOSIS — M1A09X0 Idiopathic chronic gout, multiple sites, without tophus (tophi): Secondary | ICD-10-CM

## 2015-12-25 DIAGNOSIS — M5137 Other intervertebral disc degeneration, lumbosacral region: Secondary | ICD-10-CM

## 2015-12-25 DIAGNOSIS — N200 Calculus of kidney: Secondary | ICD-10-CM

## 2015-12-25 HISTORY — DX: Other long term (current) drug therapy: Z79.899

## 2015-12-25 HISTORY — DX: Body Mass Index (BMI) 40.0 and over, adult: Z684

## 2015-12-25 HISTORY — DX: Edema, unspecified: R60.9

## 2015-12-25 HISTORY — DX: Calculus of kidney: N20.0

## 2015-12-25 HISTORY — DX: Prediabetes: R73.03

## 2015-12-25 HISTORY — DX: Morbid (severe) obesity due to excess calories: E66.01

## 2015-12-25 HISTORY — DX: Polyosteoarthritis, unspecified: M15.9

## 2015-12-25 HISTORY — DX: Idiopathic chronic gout, multiple sites, without tophus (tophi): M1A.09X0

## 2015-12-25 HISTORY — DX: Other intervertebral disc degeneration, lumbosacral region: M51.37

## 2015-12-25 HISTORY — DX: Deficiency of other specified B group vitamins: E53.8

## 2015-12-25 HISTORY — DX: Other intervertebral disc degeneration, lumbosacral region without mention of lumbar back pain or lower extremity pain: M51.379

## 2015-12-25 HISTORY — DX: Mixed hyperlipidemia: E78.2

## 2015-12-25 HISTORY — DX: Other malaise: R53.81

## 2015-12-25 HISTORY — DX: Anemia in other chronic diseases classified elsewhere: D63.8

## 2015-12-25 HISTORY — DX: Unspecified glaucoma: H40.9

## 2016-01-16 DIAGNOSIS — R319 Hematuria, unspecified: Secondary | ICD-10-CM

## 2016-01-16 HISTORY — DX: Hematuria, unspecified: R31.9

## 2016-09-18 ENCOUNTER — Encounter (HOSPITAL_COMMUNITY): Payer: Self-pay | Admitting: Emergency Medicine

## 2016-09-18 ENCOUNTER — Inpatient Hospital Stay (HOSPITAL_COMMUNITY)
Admission: EM | Admit: 2016-09-18 | Discharge: 2016-09-20 | DRG: 603 | Disposition: A | Discharge status: Home Health | Payer: BC Managed Care – PPO | Attending: Internal Medicine | Admitting: Internal Medicine

## 2016-09-18 DIAGNOSIS — L03116 Cellulitis of left lower limb: Principal | ICD-10-CM

## 2016-09-18 DIAGNOSIS — I890 Lymphedema, not elsewhere classified: Secondary | ICD-10-CM | POA: Diagnosis not present

## 2016-09-18 DIAGNOSIS — Z9989 Dependence on other enabling machines and devices: Secondary | ICD-10-CM

## 2016-09-18 DIAGNOSIS — L0390 Cellulitis, unspecified: Secondary | ICD-10-CM | POA: Diagnosis present

## 2016-09-18 DIAGNOSIS — H409 Unspecified glaucoma: Secondary | ICD-10-CM | POA: Diagnosis present

## 2016-09-18 DIAGNOSIS — Z79899 Other long term (current) drug therapy: Secondary | ICD-10-CM

## 2016-09-18 DIAGNOSIS — Z7901 Long term (current) use of anticoagulants: Secondary | ICD-10-CM

## 2016-09-18 DIAGNOSIS — G4733 Obstructive sleep apnea (adult) (pediatric): Secondary | ICD-10-CM

## 2016-09-18 DIAGNOSIS — Z86718 Personal history of other venous thrombosis and embolism: Secondary | ICD-10-CM

## 2016-09-18 DIAGNOSIS — Z6841 Body Mass Index (BMI) 40.0 and over, adult: Secondary | ICD-10-CM

## 2016-09-18 DIAGNOSIS — I480 Paroxysmal atrial fibrillation: Secondary | ICD-10-CM | POA: Diagnosis present

## 2016-09-18 LAB — CBC WITH DIFFERENTIAL/PLATELET
BASOS PCT: 0 %
Basophils Absolute: 0 10*3/uL (ref 0.0–0.1)
EOS ABS: 0.2 10*3/uL (ref 0.0–0.7)
Eosinophils Relative: 2 %
HCT: 38 % — ABNORMAL LOW (ref 39.0–52.0)
Hemoglobin: 12.5 g/dL — ABNORMAL LOW (ref 13.0–17.0)
LYMPHS PCT: 27 %
Lymphs Abs: 2.5 10*3/uL (ref 0.7–4.0)
MCH: 29.7 pg (ref 26.0–34.0)
MCHC: 32.9 g/dL (ref 30.0–36.0)
MCV: 90.3 fL (ref 78.0–100.0)
MONO ABS: 1.1 10*3/uL — AB (ref 0.1–1.0)
Monocytes Relative: 12 %
NEUTROS ABS: 5.2 10*3/uL (ref 1.7–7.7)
Neutrophils Relative %: 59 %
PLATELETS: 232 10*3/uL (ref 150–400)
RBC: 4.21 MIL/uL — ABNORMAL LOW (ref 4.22–5.81)
RDW: 14.6 % (ref 11.5–15.5)
WBC: 9 10*3/uL (ref 4.0–10.5)

## 2016-09-18 LAB — COMPREHENSIVE METABOLIC PANEL
ALT: 19 U/L (ref 17–63)
ANION GAP: 9 (ref 5–15)
AST: 24 U/L (ref 15–41)
Albumin: 2.8 g/dL — ABNORMAL LOW (ref 3.5–5.0)
Alkaline Phosphatase: 80 U/L (ref 38–126)
BILIRUBIN TOTAL: 0.8 mg/dL (ref 0.3–1.2)
BUN: 15 mg/dL (ref 6–20)
CALCIUM: 9 mg/dL (ref 8.9–10.3)
CO2: 26 mmol/L (ref 22–32)
Chloride: 102 mmol/L (ref 101–111)
Creatinine, Ser: 0.81 mg/dL (ref 0.61–1.24)
GFR calc Af Amer: >60 mL/min (ref >60)
GFR calc non Af Amer: >60 mL/min (ref >60)
Glucose, Bld: 115 mg/dL — ABNORMAL HIGH (ref 65–99)
Potassium: 3.6 mmol/L (ref 3.5–5.1)
Sodium: 137 mmol/L (ref 135–145)
TOTAL PROTEIN: 8.7 g/dL — AB (ref 6.5–8.1)

## 2016-09-18 MED ORDER — VANCOMYCIN HCL IN DEXTROSE 1-5 GM/200ML-% IV SOLN
1000.0000 mg | Freq: Once | INTRAVENOUS | Status: DC
  Filled 2016-09-18: qty 200

## 2016-09-18 MED ORDER — VANCOMYCIN HCL 10 G IV SOLR
2500.0000 mg | Freq: Once | INTRAVENOUS | Status: AC
  Administered 2016-09-18: 2500 mg via INTRAVENOUS
  Filled 2016-09-18: qty 2500

## 2016-09-18 MED ORDER — VANCOMYCIN HCL 10 G IV SOLR
1500.0000 mg | Freq: Two times a day (BID) | INTRAVENOUS | Status: DC
  Administered 2016-09-19 – 2016-09-20 (×3): 1500 mg via INTRAVENOUS
  Filled 2016-09-18 (×4): qty 1500

## 2016-09-18 NOTE — ED Triage Notes (Addendum)
PT reports left leg cellulitis, on an oral antibiotic Taking sulfa twice a day. MD sent pt here for a stronger antibiotic. Pt reports the redness to his leg has gone away but that the swelling has gotten worse. Pt had labs drawn Monday.

## 2016-09-18 NOTE — Progress Notes (Signed)
Pharmacy Antibiotic Note  Lisa Blakeman. is a 56 y.o. male admitted on 09/18/2016 with cellulitis.  Pharmacy has been consulted for vancomycin dosing.  Patient received vancomycin 2500mg  IV load once in the ED.  Plan: Vancomycin 1500mg  IV every 12 hours.  Goal trough 10-15 mcg/mL.  Monitor culture data, renal function and clinical course VT at SS prn     Temp (24hrs), Avg:98.9 F (37.2 C), Min:98.7 F (37.1 C), Max:99.1 F (37.3 C)   Recent Labs Lab 09/18/16 2036  WBC 9.0  CREATININE 0.81    CrCl cannot be calculated (Unknown ideal weight.).    No Known Allergies   Andrey Cota. Diona Foley, PharmD, BCPS Clinical Pharmacist [phone] 09/18/2016 9:53 PM

## 2016-09-18 NOTE — ED Provider Notes (Signed)
[place] DEPT Provider Note   CSN: [id number] Arrival date & time: 09/18/16  1723     History   Chief Complaint Chief Complaint  Patient presents with  . cellulitis    HPI Colin Hall. is a 56 y.o. male.  HPI Patient presents to the emergency room for evaluation of persistent cellulitis. Patient has a history of morbid obesity and chronic lymphedema. Patient also has a history of recurrent cellulitis requiring hospitalization and several weeks of IV antibiotics in the past. Patient started having redness and swelling of his left lower extremity this weekend. He had a fever as well. He went to see his primary care doctor and was started on sulfa antibiotics on Monday. Patient went back today for a checkup. The patient feels that the swelling is getting worse although the redness may not be quite as bad. His doctor was concerned about the appearance and thought he may need to be admitted to the hospital for IV antibiotics. He was sent to the emergency room for that reason. Patient denies any fevers.  No vomiting or diarrhea. No other complaints. Past Medical History:  Diagnosis Date  . Arthritis    "back" (07/20/2014)  . Chronic anemia   . Glaucoma   . Gout   . Hypertension   . Kidney stones    "multiple"  . Morbid obesity ([place]) 08/29/2010   pt is 500 lbs  . New onset atrial fibrillation ([place]) 07/19/2014   w/RVR  . OSA on CPAP     Patient Active Problem List   Diagnosis Date Noted  . Acute pulmonary embolism ([place]) 07/21/2014  . Acute diastolic CHF (congestive heart failure) ([place]) 07/21/2014  . Bilateral pleural effusion 07/21/2014  . Mild CAD 07/21/2014  . Atrial fibrillation with rapid ventricular response ([place]) 07/20/2014  . Morbid obesity ([place]) 07/20/2014  . UTI (urinary tract infection) 07/20/2014  . Demand ischemia ([place]) 07/20/2014  . HTN (hypertension) 07/20/2014  . OSA on CPAP 07/20/2014    Past Surgical History:  Procedure Laterality Date  . CARDIAC  CATHETERIZATION  03/2013   "no blockages"  . CYSTOSCOPY W/ STONE MANIPULATION  ~ 2010  . LAPAROSCOPIC GASTRIC SLEEVE RESECTION  08/2013   "gastric sleeve"  . LEFT HEART CATHETERIZATION WITH CORONARY ANGIOGRAM N/A 07/20/2014   Procedure: LEFT HEART CATHETERIZATION WITH CORONARY ANGIOGRAM;  Surgeon: Wellington Hampshire, MD;  Location: [facility];  Service: Cardiovascular;  Laterality: N/A;  . TONSILLECTOMY  05/1965       Home Medications    Prior to Admission medications   Medication Sig Start Date End Date Taking? Authorizing Provider  acetaminophen (TYLENOL) 500 MG tablet Take 1,000 mg by mouth every 6 (six) hours as needed (pain).   Yes Historical Provider, MD  allopurinol (ZYLOPRIM) 100 MG tablet Take 200 mg by mouth every morning.    Yes Historical Provider, MD  diltiazem (DILACOR XR) 120 MG 24 hr capsule Take 1 capsule (120 mg total) by mouth daily. 07/23/14  Yes Eileen Stanford, PA-C  furosemide (LASIX) 20 MG tablet Take 1 tablet (20 mg total) by mouth daily. 07/23/14  Yes Eileen Stanford, PA-C  metoprolol tartrate (LOPRESSOR) 25 MG tablet Take 1 tablet (25 mg total) by mouth 2 (two) times daily. 07/23/14  Yes Eileen Stanford, PA-C  Multiple Vitamins-Minerals (MULTIVITAMIN PO) Take 1 tablet by mouth daily.   Yes Historical Provider, MD  mupirocin ointment (BACTROBAN) 2 % Apply 1 application topically every evening. Apply to stomach   Yes  Historical Provider, MD  rivaroxaban (XARELTO) 20 MG TABS tablet Take 1 tablet (20 mg total) by mouth daily with supper. Start this prescription after you finish the Xarelto starter pack. 07/22/14  Yes Brett Canales, PA-C  sulfamethoxazole-trimethoprim (BACTRIM DS,SEPTRA DS) 800-160 MG tablet Take 1 tablet by mouth 2 (two) times daily. For 5 days 09/16/16  Yes Historical Provider, MD  ondansetron (ZOFRAN ODT) 4 MG disintegrating tablet Take 1 tablet (4 mg total) by mouth every 8 (eight) hours as needed for nausea. Patient not taking: Reported on  07/20/2014 01/25/13   Alvina Chou, PA-C  oxyCODONE-acetaminophen (PERCOCET/ROXICET) 5-325 MG per tablet Take 2 tablets by mouth every 4 (four) hours as needed for pain. Patient not taking: Reported on 07/20/2014 01/25/13   Alvina Chou, PA-C  Rivaroxaban (XARELTO STARTER PACK) 15 & 20 MG TBPK Take as directed on package: Start with one 15mg  tablet by mouth twice a day with food.On Day 22 switch to one 20mg  tab once a day with food. Patient not taking: Reported on 09/18/2016 07/22/14   Brett Canales, PA-C  tamsulosin (FLOMAX) 0.4 MG CAPS Take 1 capsule (0.4 mg total) by mouth 2 (two) times daily. Patient not taking: Reported on 07/20/2014 01/25/13   Alvina Chou, PA-C    Family History No family history on file.  Social History Social History  Substance Use Topics  . Smoking status: Never Smoker  . Smokeless tobacco: Never Used  . Alcohol use Yes     Comment: 07/20/2014 "might drink q 2-3 years"     Allergies   Patient has no known allergies.   Review of Systems Review of Systems  All other systems reviewed and are negative.    Physical Exam Updated Vital Signs BP 138/74   Pulse 84   Temp 99.1 F (37.3 C) (Oral)   Resp 20   Wt (!) 217.7 kg   SpO2 94%   BMI 70.88 kg/m   Physical Exam  Constitutional: No distress.  HENT:  Head: Normocephalic and atraumatic.  Right Ear: External ear normal.  Left Ear: External ear normal.  Eyes: Conjunctivae are normal. Right eye exhibits no discharge. Left eye exhibits no discharge. No scleral icterus.  Neck: Neck supple. No tracheal deviation present.  Cardiovascular: Normal rate, regular rhythm and intact distal pulses.   Pulmonary/Chest: Effort normal and breath sounds normal. No stridor. No respiratory distress. He has no wheezes. He has no rales.  Abdominal: Soft. Bowel sounds are normal. He exhibits no distension. There is no tenderness. There is no rebound and no guarding.  Musculoskeletal: He exhibits no edema or  tenderness.  Neurological: He is alert. He has normal strength. No cranial nerve deficit (no facial droop, extraocular movements intact, no slurred speech) or sensory deficit. He exhibits normal muscle tone. He displays no seizure activity. Coordination normal.  Skin: Skin is warm and dry. No rash noted.  Psychiatric: He has a normal mood and affect.  Nursing note and vitals reviewed.    ED Treatments / Results  Labs (all labs ordered are listed, but only abnormal results are displayed) Labs Reviewed  COMPREHENSIVE METABOLIC PANEL - Abnormal; Notable for the following:       Result Value   Glucose, Bld 115 (*)    Total Protein 8.7 (*)    Albumin 2.8 (*)    All other components within normal limits  CBC WITH DIFFERENTIAL/PLATELET - Abnormal; Notable for the following:    RBC 4.21 (*)    Hemoglobin 12.5 (*)  HCT 38.0 (*)    Monocytes Absolute 1.1 (*)    All other components within normal limits     Radiology No results found.  Procedures Procedures (including critical care time)  Medications Ordered in ED Medications  vancomycin (VANCOCIN) 2,500 mg in sodium chloride 0.9 % 500 mL IVPB (not administered)    Followed by  vancomycin (VANCOCIN) 1,500 mg in sodium chloride 0.9 % 500 mL IVPB (not administered)     Initial Impression / Assessment and Plan / ED Course  I have reviewed the triage vital signs and the nursing notes.  Pertinent labs & imaging results that were available during my care of the patient were reviewed by me and considered in my medical decision making (see chart for details).  Pt presents to the emergency room with complaints of lymphedema and cellulitis. Patient has a history of recurrent cellulitis requiring IV antibiotics.  Patient was seen by his primary doctor earlier this week. He was started on oral antibiotics. His symptoms have progressed despite treatment. Because of his history of requiring IV antibiotics in the past his primary care provider  sent him to the hospital for admission and IV antibiotics.  Final Clinical Impressions(s) / ED Diagnoses   Final diagnoses:  Cellulitis of left lower extremity  Lymphedema    New Prescriptions New Prescriptions   No medications on file     Dorie Rank, MD 09/18/16 2204

## 2016-09-19 ENCOUNTER — Encounter (HOSPITAL_COMMUNITY): Payer: Self-pay | Admitting: Internal Medicine

## 2016-09-19 DIAGNOSIS — L0390 Cellulitis, unspecified: Secondary | ICD-10-CM

## 2016-09-19 DIAGNOSIS — H409 Unspecified glaucoma: Secondary | ICD-10-CM | POA: Diagnosis present

## 2016-09-19 DIAGNOSIS — L03116 Cellulitis of left lower limb: Secondary | ICD-10-CM | POA: Diagnosis not present

## 2016-09-19 DIAGNOSIS — Z9989 Dependence on other enabling machines and devices: Secondary | ICD-10-CM | POA: Diagnosis not present

## 2016-09-19 DIAGNOSIS — I480 Paroxysmal atrial fibrillation: Secondary | ICD-10-CM | POA: Diagnosis present

## 2016-09-19 DIAGNOSIS — I890 Lymphedema, not elsewhere classified: Secondary | ICD-10-CM | POA: Diagnosis present

## 2016-09-19 DIAGNOSIS — Z79899 Other long term (current) drug therapy: Secondary | ICD-10-CM | POA: Diagnosis not present

## 2016-09-19 DIAGNOSIS — Z86718 Personal history of other venous thrombosis and embolism: Secondary | ICD-10-CM | POA: Diagnosis not present

## 2016-09-19 DIAGNOSIS — Z7901 Long term (current) use of anticoagulants: Secondary | ICD-10-CM | POA: Diagnosis not present

## 2016-09-19 DIAGNOSIS — G4733 Obstructive sleep apnea (adult) (pediatric): Secondary | ICD-10-CM | POA: Diagnosis present

## 2016-09-19 DIAGNOSIS — Z6841 Body Mass Index (BMI) 40.0 and over, adult: Secondary | ICD-10-CM | POA: Diagnosis not present

## 2016-09-19 HISTORY — DX: Cellulitis, unspecified: L03.90

## 2016-09-19 HISTORY — DX: Cellulitis of left lower limb: L03.116

## 2016-09-19 LAB — COMPREHENSIVE METABOLIC PANEL
ALBUMIN: 2.5 g/dL — AB (ref 3.5–5.0)
ALK PHOS: 71 U/L (ref 38–126)
ALT: 15 U/L — AB (ref 17–63)
AST: 21 U/L (ref 15–41)
Anion gap: 13 (ref 5–15)
BUN: 13 mg/dL (ref 6–20)
CALCIUM: 8.5 mg/dL — AB (ref 8.9–10.3)
CHLORIDE: 103 mmol/L (ref 101–111)
CO2: 20 mmol/L — AB (ref 22–32)
Creatinine, Ser: 0.71 mg/dL (ref 0.61–1.24)
GFR calc non Af Amer: >60 mL/min (ref >60)
GLUCOSE: 104 mg/dL — AB (ref 65–99)
Potassium: 3.8 mmol/L (ref 3.5–5.1)
SODIUM: 136 mmol/L (ref 135–145)
Total Bilirubin: 0.9 mg/dL (ref 0.3–1.2)
Total Protein: 7.2 g/dL (ref 6.5–8.1)

## 2016-09-19 LAB — CBC WITH DIFFERENTIAL/PLATELET
BASOS PCT: 0 %
Basophils Absolute: 0 10*3/uL (ref 0.0–0.1)
EOS PCT: 3 %
Eosinophils Absolute: 0.2 10*3/uL (ref 0.0–0.7)
HEMATOCRIT: 35.2 % — AB (ref 39.0–52.0)
HEMOGLOBIN: 11.6 g/dL — AB (ref 13.0–17.0)
LYMPHS PCT: 22 %
Lymphs Abs: 1.8 10*3/uL (ref 0.7–4.0)
MCH: 29.3 pg (ref 26.0–34.0)
MCHC: 33 g/dL (ref 30.0–36.0)
MCV: 88.9 fL (ref 78.0–100.0)
MONOS PCT: 12 %
Monocytes Absolute: 1 10*3/uL (ref 0.1–1.0)
NEUTROS PCT: 63 %
Neutro Abs: 5.1 10*3/uL (ref 1.7–7.7)
PLATELETS: ADEQUATE 10*3/uL (ref 150–400)
RBC: 3.96 MIL/uL — AB (ref 4.22–5.81)
RDW: 14.6 % (ref 11.5–15.5)
WBC: 8.1 10*3/uL (ref 4.0–10.5)

## 2016-09-19 LAB — HIV ANTIBODY (ROUTINE TESTING W REFLEX): HIV SCREEN 4TH GENERATION: NONREACTIVE

## 2016-09-19 MED ORDER — PIPERACILLIN-TAZOBACTAM 3.375 G IVPB
3.3750 g | Freq: Three times a day (TID) | INTRAVENOUS | Status: DC
  Administered 2016-09-19 – 2016-09-20 (×3): 3.375 g via INTRAVENOUS
  Filled 2016-09-19 (×6): qty 50

## 2016-09-19 MED ORDER — ALLOPURINOL 100 MG PO TABS
200.0000 mg | ORAL_TABLET | Freq: Every morning | ORAL | Status: DC
  Administered 2016-09-19 – 2016-09-20 (×2): 200 mg via ORAL
  Filled 2016-09-19 (×2): qty 2

## 2016-09-19 MED ORDER — METOPROLOL TARTRATE 25 MG PO TABS
25.0000 mg | ORAL_TABLET | Freq: Two times a day (BID) | ORAL | Status: DC
  Administered 2016-09-19 – 2016-09-20 (×3): 25 mg via ORAL
  Filled 2016-09-19 (×3): qty 1

## 2016-09-19 MED ORDER — FUROSEMIDE 20 MG PO TABS
20.0000 mg | ORAL_TABLET | Freq: Every day | ORAL | Status: DC
  Administered 2016-09-19 – 2016-09-20 (×2): 20 mg via ORAL
  Filled 2016-09-19 (×2): qty 1

## 2016-09-19 MED ORDER — DILTIAZEM HCL ER 120 MG PO CP24: 120.0000 mg | ORAL_CAPSULE | Freq: Every day | ORAL | Status: DC

## 2016-09-19 MED ORDER — ONDANSETRON HCL 4 MG PO TABS: 4.0000 mg | ORAL_TABLET | Freq: Four times a day (QID) | ORAL | Status: DC | PRN

## 2016-09-19 MED ORDER — ONDANSETRON HCL 4 MG/2ML IJ SOLN: 4.0000 mg | Freq: Four times a day (QID) | INTRAMUSCULAR | Status: DC | PRN

## 2016-09-19 MED ORDER — ACETAMINOPHEN 325 MG PO TABS
650.0000 mg | ORAL_TABLET | Freq: Four times a day (QID) | ORAL | Status: DC | PRN
  Administered 2016-09-19 – 2016-09-20 (×2): 650 mg via ORAL
  Filled 2016-09-19 (×2): qty 2

## 2016-09-19 MED ORDER — PIPERACILLIN-TAZOBACTAM 3.375 G IVPB 30 MIN
3.3750 g | Freq: Once | INTRAVENOUS | Status: AC
  Administered 2016-09-19: 3.375 g via INTRAVENOUS
  Filled 2016-09-19: qty 50

## 2016-09-19 MED ORDER — ACETAMINOPHEN 650 MG RE SUPP: 650.0000 mg | Freq: Four times a day (QID) | RECTAL | Status: DC | PRN

## 2016-09-19 MED ORDER — DILTIAZEM HCL ER COATED BEADS 120 MG PO CP24
120.0000 mg | ORAL_CAPSULE | Freq: Every day | ORAL | Status: DC
  Administered 2016-09-19 – 2016-09-20 (×2): 120 mg via ORAL
  Filled 2016-09-19 (×2): qty 1

## 2016-09-19 MED ORDER — RIVAROXABAN 20 MG PO TABS
20.0000 mg | ORAL_TABLET | Freq: Every day | ORAL | Status: DC
  Administered 2016-09-19: 20 mg via ORAL
  Filled 2016-09-19: qty 1

## 2016-09-19 NOTE — Progress Notes (Signed)
Pharmacy Antibiotic Note  Colin Hall. is a 56 y.o. male admitted on 09/18/2016 with cellulitis.  Pharmacy has been consulted for vancomycin and Zosyn dosing.  Patient received vancomycin 2500mg  IV load once in the ED.  Plan: Vancomycin 1500mg  IV every 12 hours.  Goal trough 10-15 mcg/mL.  Zosyn 3.375gm IV q8h - each dose over 4 hours Monitor culture data, renal function and clinical course VT at SS prn  Height: 5\' 9"  (175.3 cm) Weight: (!) 480 lb (217.7 kg) IBW/kg (Calculated) : 70.7  Temp (24hrs), Avg:98.6 F (37 C), Min:98 F (36.7 C), Max:99.1 F (37.3 C)   Recent Labs Lab 09/18/16 2036  WBC 9.0  CREATININE 0.81    Estimated Creatinine Clearance: 184.3 mL/min (by C-G formula based on SCr of 0.81 mg/dL).    No Known Allergies   Sherlon Handing, PharmD, BCPS Clinical pharmacist, pager [phone] 09/19/2016 3:41 AM

## 2016-09-19 NOTE — Care Management Note (Signed)
Case Management Note  Patient Details  Name: Colin Hall. MRN: [id number] Date of Birth: [birth year]  Subjective/Objective:  Pt admitted on 09/18/16 s/p cellulitis of Lt lower extremity.  PTA, pt resided at home with significant other.                  Action/Plan: Will follow for discharge planning as pt progresses.    Expected Discharge Date:       Expected Discharge Plan:  [place]  In-House Referral:     Discharge planning Services  CM Consult  Post Acute Care Choice:    Choice offered to:     DME Arranged:    DME Agency:     HH Arranged:    [place] Agency:     Status of Service:  In process, will continue to follow  If discussed at Long Length of Stay Meetings, dates discussed:    Additional Comments:  Ella Bodo, RN 09/19/2016, 3:20 PM

## 2016-09-19 NOTE — Progress Notes (Signed)
PROGRESS NOTE        PATIENT DETAILS Name: Colin Hall. Age: 56 y.o. Sex: male Date of Birth: December 04, 1960 Admit Date: 09/18/2016 Admitting Physician Rise Patience, MD QQP:YPPJKD,TOIZ, MD  Brief Narrative: Patient is a 56 y.o. male with history of morbid obesity, bilateral lower extremity lymphedema, prior venous thromboembolism and anticoagulation, paroxysmal atrial fibrillation-admitted with concern for worsening left lower extremity cellulitis spite of outpatient antibiotic therapy. See below for further details.  Subjective: Per patient-erythema in the left lower extremity has improved.  Assessment/Plan: Cellulitis of left lower extremity: Continue IV antimicrobial therapy. Some mild erythema present in the upper leg lower thigh area of left lower extremity-but patient already reports clinical improvement. Has bilateral lower extremity lymphedema with left >>right. Doubt DVT-however patient already on anticoagulation with Xarelto.  History of venous thromboembolism: Continue Xarelto  History of paroxysmal atrial fibrillation: Continue Cardizem, metoprolol.CHADSVASC of around 3. Continue Xarelto.  Morbid obesity: Counseled regarding importance of weight loss  DVT Prophylaxis: Full dose anticoagulation with Xarelto  Code Status: Full code  Family Communication: None at bedside  Disposition Plan: Remain inpatient-hopefully home in the next 1-2 days if clinical improvement continues.  Antimicrobial agents: Anti-infectives    Start     Dose/Rate Route Frequency Ordered Stop   09/19/16 1400  piperacillin-tazobactam (ZOSYN) IVPB 3.375 g     3.375 g 12.5 mL/hr over 240 Minutes Intravenous Every 8 hours 09/19/16 0342     09/19/16 1030  vancomycin (VANCOCIN) 1,500 mg in sodium chloride 0.9 % 500 mL IVPB     1,500 mg 250 mL/hr over 120 Minutes Intravenous Every 12 hours 09/18/16 2159     09/19/16 0345  piperacillin-tazobactam (ZOSYN) IVPB 3.375  g     3.375 g 100 mL/hr over 30 Minutes Intravenous  Once 09/19/16 0333 09/19/16 0514   09/18/16 2230  vancomycin (VANCOCIN) 2,500 mg in sodium chloride 0.9 % 500 mL IVPB     2,500 mg 250 mL/hr over 120 Minutes Intravenous  Once 09/18/16 2159 09/19/16 0051   09/18/16 2145  vancomycin (VANCOCIN) IVPB 1000 mg/200 mL premix  Status:  Discontinued     1,000 mg 200 mL/hr over 60 Minutes Intravenous  Once 09/18/16 2142 09/18/16 2155      Procedures: None  CONSULTS:  None  Time spent: 25- minutes-Greater than 50% of this time was spent in counseling, explanation of diagnosis, planning of further management, and coordination of care.  MEDICATIONS: Scheduled Meds: . allopurinol  200 mg Oral q morning - 10a  . diltiazem  120 mg Oral Daily  . furosemide  20 mg Oral Daily  . metoprolol tartrate  25 mg Oral BID  . piperacillin-tazobactam (ZOSYN)  IV  3.375 g Intravenous Q8H  . rivaroxaban  20 mg Oral Q supper  . vancomycin  1,500 mg Intravenous Q12H   Continuous Infusions: PRN Meds:.acetaminophen **OR** acetaminophen, ondansetron **OR** ondansetron (ZOFRAN) IV   PHYSICAL EXAM: Vital signs: Vitals:   09/19/16 0221 09/19/16 0340 09/19/16 0538 09/19/16 0900  BP: (!) 118/46  123/60 126/65  Pulse: 86 83 89 79  Resp: 17 18 18 20   Temp: 98 F (36.7 C)  98.8 F (37.1 C) 98.2 F (36.8 C)  TempSrc: Oral  Oral Oral  SpO2: 99% 98% 98% 98%  Weight:      Height: 5\' 9"  (1.753 m)  Filed Weights   09/18/16 2159  Weight: (!) 217.7 kg (480 lb)   Body mass index is 70.88 kg/m.   General appearance :Awake, alert, not in any distress. Speech Clear.  Eyes:, pupils equally reactive to light and accomodation,no scleral icterus. HEENT: Atraumatic and Normocephalic Neck: supple, no JVD. No cervical lymphadenopathy.  Resp:Good air entry bilaterally, no added sounds  CVS: S1 S2 regular, no murmurs.  GI: Bowel sounds present, Non tender and not distended with no gaurding, rigidity or  rebound.No organomegaly Extremities: B/L Lower Ex has chronic venous stasis changes, has evidence of lymphedema bilaterally. See picture below Neurology:  speech clear,Non focal, sensation is grossly intact. Psychiatric: Normal judgment and insight. Alert and oriented x 3. Normal mood. Musculoskeletal:No digital cyanosis     I have personally reviewed following labs and imaging studies  LABORATORY DATA: CBC:  Recent Labs Lab 09/18/16 2036 09/19/16 0423  WBC 9.0 8.1  NEUTROABS 5.2 5.1  HGB 12.5* 11.6*  HCT 38.0* 35.2*  MCV 90.3 88.9  PLT 232 PLATELET CLUMPS NOTED ON SMEAR, COUNT APPEARS ADEQUATE    Basic Metabolic Panel:  Recent Labs Lab 09/18/16 2036 09/19/16 0423  NA 137 136  K 3.6 3.8  CL 102 103  CO2 26 20*  GLUCOSE 115* 104*  BUN 15 13  CREATININE 0.81 0.71  CALCIUM 9.0 8.5*    GFR: Estimated Creatinine Clearance: 186.6 mL/min (by C-G formula based on SCr of 0.71 mg/dL).  Liver Function Tests:  Recent Labs Lab 09/18/16 2036 09/19/16 0423  AST 24 21  ALT 19 15*  ALKPHOS 80 71  BILITOT 0.8 0.9  PROT 8.7* 7.2  ALBUMIN 2.8* 2.5*   No results for input(s): LIPASE, AMYLASE in the last 168 hours. No results for input(s): AMMONIA in the last 168 hours.  Coagulation Profile: No results for input(s): INR, PROTIME in the last 168 hours.  Cardiac Enzymes: No results for input(s): CKTOTAL, CKMB, CKMBINDEX, TROPONINI in the last 168 hours.  BNP (last 3 results) No results for input(s): PROBNP in the last 8760 hours.  HbA1C: No results for input(s): HGBA1C in the last 72 hours.  CBG: No results for input(s): GLUCAP in the last 168 hours.  Lipid Profile: No results for input(s): CHOL, HDL, LDLCALC, TRIG, CHOLHDL, LDLDIRECT in the last 72 hours.  Thyroid Function Tests: No results for input(s): TSH, T4TOTAL, FREET4, T3FREE, THYROIDAB in the last 72 hours.  Anemia Panel: No results for input(s): VITAMINB12, FOLATE, FERRITIN, TIBC, IRON, RETICCTPCT  in the last 72 hours.  Urine analysis:    Component Value Date/Time   COLORURINE AMBER (A) 07/20/2014 0040   APPEARANCEUR CLOUDY (A) 07/20/2014 0040   LABSPEC 1.024 07/20/2014 0040   PHURINE 6.0 07/20/2014 0040   GLUCOSEU NEGATIVE 07/20/2014 0040   HGBUR SMALL (A) 07/20/2014 0040   BILIRUBINUR SMALL (A) 07/20/2014 0040   KETONESUR NEGATIVE 07/20/2014 0040   PROTEINUR NEGATIVE 07/20/2014 0040   UROBILINOGEN 2.0 (H) 07/20/2014 0040   NITRITE NEGATIVE 07/20/2014 0040   LEUKOCYTESUR SMALL (A) 07/20/2014 0040    Sepsis Labs: Lactic Acid, Venous No results found for: LATICACIDVEN  MICROBIOLOGY: No results found for this or any previous visit (from the past 240 hour(s)).  RADIOLOGY STUDIES/RESULTS: No results found.   LOS: 0 days   Oren Binet, MD  [facility] Pager:[phone]  If 7PM-7AM, please contact night-coverage [email or web address] Password [facility] 09/19/2016, 1:57 PM

## 2016-09-19 NOTE — H&P (Signed)
History and Physical    Colin Hall. SNK:[id number] DOB: [birth year] DOA: 09/18/2016  PCP: [name], MD  Patient coming from: Home.  Chief Complaint: Left lower extremity swelling and pain.  HPI: Colin Hall. is a 56 y.o. male with history of atrial fibrillation, pulmonary embolism, morbid obesity and history of gout started experiencing left lower extremity swelling fever and chills last Saturday 5 days ago. Patient followed up with his primary care physician and was given oral antibiotics for cellulitis. Despite taking which patients symptoms did not improve.   ED Course: On exam in the ER patient has warm lower extremity on the left side with erythema extending up to the midcalf. Patient also has swelling more worse than the right side. Patient is being admitted for failed outpatient oral therapy for cellulitis. Given the lymphedema patient will benefit from inpatient IV antibiotics.  Review of Systems: As per HPI, rest all negative.   Past Medical History:  Diagnosis Date  . Arthritis    "back" (07/20/2014)  . Chronic anemia   . Glaucoma   . Gout   . Hypertension   . Kidney stones    "multiple"  . Morbid obesity ([place]) 08/29/2010   pt is 500 lbs  . New onset atrial fibrillation ([place]) 07/19/2014   w/RVR  . OSA on CPAP     Past Surgical History:  Procedure Laterality Date  . CARDIAC CATHETERIZATION  03/2013   "no blockages"  . CYSTOSCOPY W/ STONE MANIPULATION  ~ 2010  . LAPAROSCOPIC GASTRIC SLEEVE RESECTION  08/2013   "gastric sleeve"  . LEFT HEART CATHETERIZATION WITH CORONARY ANGIOGRAM N/A 07/20/2014   Procedure: LEFT HEART CATHETERIZATION WITH CORONARY ANGIOGRAM;  Surgeon: Wellington Hampshire, MD;  Location: [facility];  Service: Cardiovascular;  Laterality: N/A;  . TONSILLECTOMY  05/1965     reports that he has never smoked. He has never used smokeless tobacco. He reports that he drinks alcohol. He reports that he uses drugs, including Marijuana.  No Known  Allergies  History reviewed. No pertinent family history.  Prior to Admission medications   Medication Sig Start Date End Date Taking? Authorizing Provider  acetaminophen (TYLENOL) 500 MG tablet Take 1,000 mg by mouth every 6 (six) hours as needed (pain).   Yes Historical Provider, MD  allopurinol (ZYLOPRIM) 100 MG tablet Take 200 mg by mouth every morning.    Yes Historical Provider, MD  diltiazem (DILACOR XR) 120 MG 24 hr capsule Take 1 capsule (120 mg total) by mouth daily. 07/23/14  Yes Eileen Stanford, PA-C  furosemide (LASIX) 20 MG tablet Take 1 tablet (20 mg total) by mouth daily. 07/23/14  Yes Eileen Stanford, PA-C  metoprolol tartrate (LOPRESSOR) 25 MG tablet Take 1 tablet (25 mg total) by mouth 2 (two) times daily. 07/23/14  Yes Eileen Stanford, PA-C  Multiple Vitamins-Minerals (MULTIVITAMIN PO) Take 1 tablet by mouth daily.   Yes Historical Provider, MD  mupirocin ointment (BACTROBAN) 2 % Apply 1 application topically every evening. Apply to stomach   Yes Historical Provider, MD  rivaroxaban (XARELTO) 20 MG TABS tablet Take 1 tablet (20 mg total) by mouth daily with supper. Start this prescription after you finish the Xarelto starter pack. 07/22/14  Yes Brett Canales, PA-C  sulfamethoxazole-trimethoprim (BACTRIM DS,SEPTRA DS) 800-160 MG tablet Take 1 tablet by mouth 2 (two) times daily. For 5 days 09/16/16  Yes Historical Provider, MD  ondansetron (ZOFRAN ODT) 4 MG disintegrating tablet Take 1 tablet (4 mg  total) by mouth every 8 (eight) hours as needed for nausea. Patient not taking: Reported on 07/20/2014 01/25/13   Alvina Chou, PA-C  oxyCODONE-acetaminophen (PERCOCET/ROXICET) 5-325 MG per tablet Take 2 tablets by mouth every 4 (four) hours as needed for pain. Patient not taking: Reported on 07/20/2014 01/25/13   Alvina Chou, PA-C  Rivaroxaban (XARELTO STARTER PACK) 15 & 20 MG TBPK Take as directed on package: Start with one 15mg  tablet by mouth twice a day with food.On  Day 22 switch to one 20mg  tab once a day with food. Patient not taking: Reported on 09/18/2016 07/22/14   Brett Canales, PA-C  tamsulosin (FLOMAX) 0.4 MG CAPS Take 1 capsule (0.4 mg total) by mouth 2 (two) times daily. Patient not taking: Reported on 07/20/2014 01/25/13   Alvina Chou, PA-C    Physical Exam: Vitals:   09/18/16 2245 09/18/16 2330 09/19/16 0015 09/19/16 0221  BP: (!) 129/54 (!) 139/56 (!) 133/55 (!) 118/46  Pulse: 83 83 81 86  Resp:    17  Temp:    98 F (36.7 C)  TempSrc:    Oral  SpO2: 97% 97% 96% 99%  Weight:      Height:    5\' 9"  (1.753 m)      Constitutional: Moderately built and nourished. Vitals:   09/18/16 2245 09/18/16 2330 09/19/16 0015 09/19/16 0221  BP: (!) 129/54 (!) 139/56 (!) 133/55 (!) 118/46  Pulse: 83 83 81 86  Resp:    17  Temp:    98 F (36.7 C)  TempSrc:    Oral  SpO2: 97% 97% 96% 99%  Weight:      Height:    5\' 9"  (1.753 m)   Eyes: Anicteric no pallor. ENMT: No discharge from the ears eyes nose or mouth. Neck: No mass felt. No neck rigidity. Respiratory: No rhonchi or crepitations. Cardiovascular: S1-S2 heard no murmurs appreciated. Abdomen: Soft nontender bowel sounds present. Musculoskeletal: Swelling of the left lower extremity more than the right side. Skin: Swelling on the left lower extremity is warm to touch and erythematous. Neurologic: Alert awake oriented to time place and person. Moves all extremities. Psychiatric: Appears normal. Normal affect.   Labs on Admission: I have personally reviewed following labs and imaging studies  CBC:  Recent Labs Lab 09/18/16 2036  WBC 9.0  NEUTROABS 5.2  HGB 12.5*  HCT 38.0*  MCV 90.3  PLT 250   Basic Metabolic Panel:  Recent Labs Lab 09/18/16 2036  NA 137  K 3.6  CL 102  CO2 26  GLUCOSE 115*  BUN 15  CREATININE 0.81  CALCIUM 9.0   GFR: Estimated Creatinine Clearance: 184.3 mL/min (by C-G formula based on SCr of 0.81 mg/dL). Liver Function Tests:  Recent  Labs Lab 09/18/16 2036  AST 24  ALT 19  ALKPHOS 80  BILITOT 0.8  PROT 8.7*  ALBUMIN 2.8*   No results for input(s): LIPASE, AMYLASE in the last 168 hours. No results for input(s): AMMONIA in the last 168 hours. Coagulation Profile: No results for input(s): INR, PROTIME in the last 168 hours. Cardiac Enzymes: No results for input(s): CKTOTAL, CKMB, CKMBINDEX, TROPONINI in the last 168 hours. BNP (last 3 results) No results for input(s): PROBNP in the last 8760 hours. HbA1C: No results for input(s): HGBA1C in the last 72 hours. CBG: No results for input(s): GLUCAP in the last 168 hours. Lipid Profile: No results for input(s): CHOL, HDL, LDLCALC, TRIG, CHOLHDL, LDLDIRECT in the last 72 hours. Thyroid Function Tests:  No results for input(s): TSH, T4TOTAL, FREET4, T3FREE, THYROIDAB in the last 72 hours. Anemia Panel: No results for input(s): VITAMINB12, FOLATE, FERRITIN, TIBC, IRON, RETICCTPCT in the last 72 hours. Urine analysis:    Component Value Date/Time   COLORURINE AMBER (A) 07/20/2014 0040   APPEARANCEUR CLOUDY (A) 07/20/2014 0040   LABSPEC 1.024 07/20/2014 0040   PHURINE 6.0 07/20/2014 0040   GLUCOSEU NEGATIVE 07/20/2014 0040   HGBUR SMALL (A) 07/20/2014 0040   BILIRUBINUR SMALL (A) 07/20/2014 0040   KETONESUR NEGATIVE 07/20/2014 0040   PROTEINUR NEGATIVE 07/20/2014 0040   UROBILINOGEN 2.0 (H) 07/20/2014 0040   NITRITE NEGATIVE 07/20/2014 0040   LEUKOCYTESUR SMALL (A) 07/20/2014 0040   Sepsis Labs: @LABRCNTIP (procalcitonin:4,lacticidven:4) )No results found for this or any previous visit (from the past 240 hour(s)).   Radiological Exams on Admission: No results found.   Assessment/Plan Principal Problem:   Cellulitis of left lower extremity Active Problems:   Morbid obesity (HCC)   OSA on CPAP   Cellulitis    1. Cellulitis of the left lower extremity - patient is placed on IV antibiotics vancomycin and Zosyn. Follow cultures. 2. OSA on  CPAP. 3. History of pulmonary embolism on xarelto. 4. History of atrial fibrillation on Cardizem and metoprolol and xarelto. 5. History of gout.   DVT prophylaxis: Xarelto. Code Status: Full code.  Family Communication: Discussed with patient.  Disposition Plan: Home.  Consults called: None.  Admission status: Inpatient.    Rise Patience MD [facility] Pager [phone].  If 7PM-7AM, please contact night-coverage [email or web address] Password [facility]  09/19/2016, 3:34 AM

## 2016-09-19 NOTE — Progress Notes (Signed)
Patient arrived from ED in stable condition. A&O,reports pain level of 2-3 in left leg. Pt oriented to room. Call bell within reach.  [facility] to notify them that patient has arrived in room and needs admission.

## 2016-09-20 DIAGNOSIS — I890 Lymphedema, not elsewhere classified: Secondary | ICD-10-CM

## 2016-09-20 DIAGNOSIS — G4733 Obstructive sleep apnea (adult) (pediatric): Secondary | ICD-10-CM

## 2016-09-20 DIAGNOSIS — Z9989 Dependence on other enabling machines and devices: Secondary | ICD-10-CM

## 2016-09-20 MED ORDER — DOXYCYCLINE HYCLATE 100 MG PO TABS: 100.0000 mg | ORAL_TABLET | Freq: Two times a day (BID) | ORAL | 0 refills | Status: DC

## 2016-09-20 NOTE — Discharge Instructions (Signed)

## 2016-09-20 NOTE — Discharge Summary (Signed)
PATIENT DETAILS Name: Colin Hall. Age: 56 y.o. Sex: male Date of Birth: 03/26/1961 MRN: [id number]. Admitting Physician: Rise Patience, MD QVZ:DGLOVF,IEPP, MD  Admit Date: 09/18/2016 Discharge date: 09/20/2016  Recommendations for Outpatient Follow-up:  1. Follow up with PCP in 1 week-has some mild erythema in left leg-but otherwise with no systemic symp from possible cellulitis-would try empiric Abx for one more week-please re-assess at next visit. 2. Please obtain BMP/CBC in one week  Admitted From:  Home  Disposition: [place]: No  Equipment/Devices: None  Discharge Condition: Stable  CODE STATUS: FULL CODE  Diet recommendation:  Heart Healthy  Brief Summary: See H&P, Labs, Consult and Test reports for all details in brief, Patient is a 56 y.o. male with history of morbid obesity, bilateral lower extremity lymphedema, prior venous thromboembolism and anticoagulation, paroxysmal atrial fibrillation-admitted with concern for worsening left lower extremity cellulitis spite of outpatient antibiotic therapy. See below for further details.  Brief Hospital Course: Cellulitis of left lower extremity: Managed with IV Vanco/Zosyn while inpatient. Had some mild erythema in his lower thigh yesterday-this has resolved. He still has some mild erythema in lower left ext-this is in a background of chronic lymphedema/stasis dermatitis. He has no fever-and no systemic signs of infection. He reports clinical improvement as well- I suspect that he may have some mild superimposed cellulitis on top of chronic venous stasis/lymphedema. Since he has improved, and has no systemic signs of infection, I think it is reasonable to try one additional week of empiric antibiotic therapy as a outpatient. Patient agreeable with this strategy, I have asked him to follow with his PCP within 1 week to re-assess.I will transition him to Doxycycline on discharge. Note-patient already on  chronic anticoagulation with Xarelto-hence doubt VTE.   History of venous thromboembolism: Continue Xarelto  History of paroxysmal atrial fibrillation: Continue Cardizem, metoprolol.CHADSVASC of around 3. Continue Xarelto.  Morbid obesity: Counseled regarding importance of weight loss  Procedures/Studies: None  Discharge Diagnoses:  Principal Problem:   Cellulitis of left lower extremity Active Problems:   Morbid obesity (HCC)   OSA on CPAP   Cellulitis   Discharge Instructions:  Activity:  As tolerated with Full fall precautions use walker/cane & assistance as needed  Discharge Instructions    Diet - low sodium heart healthy    Complete by:  As directed    Discharge instructions    Complete by:  As directed    Follow with Primary MD  [name], MD in 1 week  Please get a complete blood count and chemistry panel checked by your Primary MD at your next visit, and again as instructed by your Primary MD.  Get Medicines reviewed and adjusted: Please take all your medications with you for your next visit with your Primary MD  Laboratory/radiological data: Please request your Primary MD to go over all hospital tests and procedure/radiological results at the follow up, please ask your Primary MD to get all Hospital records sent to his/her office.  In some cases, they will be blood work, cultures and biopsy results pending at the time of your discharge. Please request that your primary care M.D. follows up on these results.  Also Note the following: If you experience worsening of your admission symptoms, develop shortness of breath, life threatening emergency, suicidal or homicidal thoughts you must seek medical attention immediately by calling 911 or calling your MD immediately  if symptoms less severe.  You must read complete instructions/literature along with all  the possible adverse reactions/side effects for all the Medicines you take and that have been prescribed to  you. Take any new Medicines after you have completely understood and accpet all the possible adverse reactions/side effects.   Do not drive when taking Pain medications or sleeping medications (Benzodaizepines)  Do not take more than prescribed Pain, Sleep and Anxiety Medications. It is not advisable to combine anxiety,sleep and pain medications without talking with your primary care practitioner  Special Instructions: If you have smoked or chewed Tobacco  in the last 2 yrs please stop smoking, stop any regular Alcohol  and or any Recreational drug use.  Wear Seat belts while driving.  Please note: You were cared for by a hospitalist during your hospital stay. Once you are discharged, your primary care physician will handle any further medical issues. Please note that NO REFILLS for any discharge medications will be authorized once you are discharged, as it is imperative that you return to your primary care physician (or establish a relationship with a primary care physician if you do not have one) for your post hospital discharge needs so that they can reassess your need for medications and monitor your lab values.   Increase activity slowly    Complete by:  As directed      Allergies as of 09/20/2016   No Known Allergies     Medication List    STOP taking these medications   sulfamethoxazole-trimethoprim 800-160 MG tablet Commonly known as:  BACTRIM DS,SEPTRA DS     TAKE these medications   acetaminophen 500 MG tablet Commonly known as:  TYLENOL Take 1,000 mg by mouth every 6 (six) hours as needed (pain).   allopurinol 100 MG tablet Commonly known as:  ZYLOPRIM Take 200 mg by mouth every morning.   diltiazem 120 MG 24 hr capsule Commonly known as:  DILACOR XR Take 1 capsule (120 mg total) by mouth daily.   doxycycline 100 MG tablet Commonly known as:  VIBRA-TABS Take 1 tablet (100 mg total) by mouth 2 (two) times daily.   furosemide 20 MG tablet Commonly known as:   LASIX Take 1 tablet (20 mg total) by mouth daily.   metoprolol tartrate 25 MG tablet Commonly known as:  LOPRESSOR Take 1 tablet (25 mg total) by mouth 2 (two) times daily.   MULTIVITAMIN PO Take 1 tablet by mouth daily.   mupirocin ointment 2 % Commonly known as:  BACTROBAN Apply 1 application topically every evening. Apply to stomach   ondansetron 4 MG disintegrating tablet Commonly known as:  ZOFRAN ODT Take 1 tablet (4 mg total) by mouth every 8 (eight) hours as needed for nausea.   oxyCODONE-acetaminophen 5-325 MG tablet Commonly known as:  PERCOCET/ROXICET Take 2 tablets by mouth every 4 (four) hours as needed for pain.   rivaroxaban 20 MG Tabs tablet Commonly known as:  XARELTO Take 1 tablet (20 mg total) by mouth daily with supper. Start this prescription after you finish the Xarelto starter pack.   Rivaroxaban 15 & 20 MG Tbpk Commonly known as:  XARELTO STARTER PACK Take as directed on package: Start with one 15mg  tablet by mouth twice a day with food.On Day 22 switch to one 20mg  tab once a day with food.   tamsulosin 0.4 MG Caps capsule Commonly known as:  FLOMAX Take 1 capsule (0.4 mg total) by mouth 2 (two) times daily.      Follow-up Information    GRISSO,GREG, MD. Schedule an appointment as soon as possible  for a visit in 1 week(s).   Specialty:  Internal Medicine Contact information: [place] [address] [phone]          No Known Allergies  Consultations:   None   Other Procedures/Studies:  No results found.   TODAY-DAY OF DISCHARGE:  Subjective:   Dorean Daniello today has no headache,no chest abdominal pain,no new weakness tingling or numbness, feels much better wants to go home today.   Objective:   Blood pressure 127/61, pulse 80, temperature 99 F (37.2 C), temperature source Oral, resp. rate 19, height 5\' 9"  (1.753 m), weight (!) 217.7 kg (480 lb), SpO2 94 %.  Intake/Output Summary (Last 24 hours) at  09/20/16 0955 Last data filed at 09/20/16 0700  Gross per 24 hour  Intake             2110 ml  Output                0 ml  Net             2110 ml   Filed Weights   09/18/16 2159  Weight: (!) 217.7 kg (480 lb)    Exam: Awake Alert, Oriented *3, No new F.N deficits, Normal affect [place].AT,PERRAL Supple Neck,No JVD, No cervical lymphadenopathy appriciated.  Symmetrical Chest wall movement, Good air movement bilaterally, CTAB RRR,No Gallops,Rubs or new Murmurs, No Parasternal Heave +ve B.Sounds, Abd Soft, Non tender, No organomegaly appriciated, No rebound -guarding or rigidity. No Cyanosis, Clubbing or edema, No new Rash or bruise   PERTINENT RADIOLOGIC STUDIES: No results found.   PERTINENT LAB RESULTS: CBC:  Recent Labs  09/18/16 2036 09/19/16 0423  WBC 9.0 8.1  HGB 12.5* 11.6*  HCT 38.0* 35.2*  PLT 232 PLATELET CLUMPS NOTED ON SMEAR, COUNT APPEARS ADEQUATE   CMET CMP     Component Value Date/Time   NA 136 09/19/2016 0423   NA 142 04/19/2014 1256   K 3.8 09/19/2016 0423   K 4.1 04/19/2014 1256   CL 103 09/19/2016 0423   CL 107 04/19/2014 1256   CO2 20 (L) 09/19/2016 0423   CO2 26 04/19/2014 1256   GLUCOSE 104 (H) 09/19/2016 0423   GLUCOSE 85 04/19/2014 1256   BUN 13 09/19/2016 0423   BUN 23 (H) 04/19/2014 1256   CREATININE 0.71 09/19/2016 0423   CREATININE 0.70 04/19/2014 1256   CALCIUM 8.5 (L) 09/19/2016 0423   CALCIUM 8.6 04/19/2014 1256   PROT 7.2 09/19/2016 0423   PROT 7.8 04/19/2014 1256   ALBUMIN 2.5 (L) 09/19/2016 0423   ALBUMIN 3.2 (L) 04/19/2014 1256   AST 21 09/19/2016 0423   AST 20 04/19/2014 1256   ALT 15 (L) 09/19/2016 0423   ALT 21 04/19/2014 1256   ALKPHOS 71 09/19/2016 0423   ALKPHOS 90 04/19/2014 1256   BILITOT 0.9 09/19/2016 0423   BILITOT 0.7 04/19/2014 1256   GFRNONAA >60 09/19/2016 0423   GFRNONAA >60 04/19/2014 1256   GFRNONAA >60 02/19/2013 0933   GFRAA >60 09/19/2016 0423   GFRAA >60 04/19/2014 1256   GFRAA >60 02/19/2013  0933    GFR Estimated Creatinine Clearance: 186.6 mL/min (by C-G formula based on SCr of 0.71 mg/dL). No results for input(s): LIPASE, AMYLASE in the last 72 hours. No results for input(s): CKTOTAL, CKMB, CKMBINDEX, TROPONINI in the last 72 hours. Invalid input(s): POCBNP No results for input(s): DDIMER in the last 72 hours. No results for input(s): HGBA1C in the last 72 hours. No results for  input(s): CHOL, HDL, LDLCALC, TRIG, CHOLHDL, LDLDIRECT in the last 72 hours. No results for input(s): TSH, T4TOTAL, T3FREE, THYROIDAB in the last 72 hours.  Invalid input(s): FREET3 No results for input(s): VITAMINB12, FOLATE, FERRITIN, TIBC, IRON, RETICCTPCT in the last 72 hours. Coags: No results for input(s): INR in the last 72 hours.  Invalid input(s): PT Microbiology: No results found for this or any previous visit (from the past 240 hour(s)).  FURTHER DISCHARGE INSTRUCTIONS:  Get Medicines reviewed and adjusted: Please take all your medications with you for your next visit with your Primary MD  Laboratory/radiological data: Please request your Primary MD to go over all hospital tests and procedure/radiological results at the follow up, please ask your Primary MD to get all Hospital records sent to his/her office.  In some cases, they will be blood work, cultures and biopsy results pending at the time of your discharge. Please request that your primary care M.D. goes through all the records of your hospital data and follows up on these results.  Also Note the following: If you experience worsening of your admission symptoms, develop shortness of breath, life threatening emergency, suicidal or homicidal thoughts you must seek medical attention immediately by calling 911 or calling your MD immediately  if symptoms less severe.  You must read complete instructions/literature along with all the possible adverse reactions/side effects for all the Medicines you take and that have been  prescribed to you. Take any new Medicines after you have completely understood and accpet all the possible adverse reactions/side effects.   Do not drive when taking Pain medications or sleeping medications (Benzodaizepines)  Do not take more than prescribed Pain, Sleep and Anxiety Medications. It is not advisable to combine anxiety,sleep and pain medications without talking with your primary care practitioner  Special Instructions: If you have smoked or chewed Tobacco  in the last 2 yrs please stop smoking, stop any regular Alcohol  and or any Recreational drug use.  Wear Seat belts while driving.  Please note: You were cared for by a hospitalist during your hospital stay. Once you are discharged, your primary care physician will handle any further medical issues. Please note that NO REFILLS for any discharge medications will be authorized once you are discharged, as it is imperative that you return to your primary care physician (or establish a relationship with a primary care physician if you do not have one) for your post hospital discharge needs so that they can reassess your need for medications and monitor your lab values.  Total Time spent coordinating discharge including counseling, education and face to face time equals 45 minutes.  SignedOren Binet 09/20/2016 9:55 AM

## 2016-09-20 NOTE — Progress Notes (Signed)
Discharge to home, d/c instructions and follow up appointments discussed with patient verbalized understanding.PIV removed no s/s of swelling or infiltration noted.

## 2017-01-16 DIAGNOSIS — R768 Other specified abnormal immunological findings in serum: Secondary | ICD-10-CM | POA: Insufficient documentation

## 2017-01-16 DIAGNOSIS — N281 Cyst of kidney, acquired: Secondary | ICD-10-CM

## 2017-01-16 HISTORY — DX: Other specified abnormal immunological findings in serum: R76.8

## 2017-01-16 HISTORY — DX: Cyst of kidney, acquired: N28.1

## 2017-02-06 ENCOUNTER — Encounter: Payer: Self-pay | Admitting: Cardiology

## 2017-02-06 ENCOUNTER — Ambulatory Visit (INDEPENDENT_AMBULATORY_CARE_PROVIDER_SITE_OTHER): Payer: BC Managed Care – PPO | Admitting: Cardiology

## 2017-02-06 VITALS — BP 134/84 | HR 70 | Ht 68.0 in | Wt >= 6400 oz

## 2017-02-06 DIAGNOSIS — I110 Hypertensive heart disease with heart failure: Secondary | ICD-10-CM | POA: Diagnosis not present

## 2017-02-06 DIAGNOSIS — I5032 Chronic diastolic (congestive) heart failure: Secondary | ICD-10-CM | POA: Diagnosis not present

## 2017-02-06 DIAGNOSIS — Z7901 Long term (current) use of anticoagulants: Secondary | ICD-10-CM | POA: Diagnosis not present

## 2017-02-06 DIAGNOSIS — I480 Paroxysmal atrial fibrillation: Secondary | ICD-10-CM | POA: Diagnosis not present

## 2017-02-06 NOTE — Patient Instructions (Addendum)
Medication Instructions:  Your physician recommends that you continue on your current medications as directed. Please refer to the Current Medication list given to you today.   Labwork: None  Testing/Procedures: You had an EKG today.  Follow-Up: Your physician wants you to follow-up in: 1 year. You will receive a reminder letter in the mail two months in advance. If you don't receive a letter, please call our office to schedule the follow-up appointment.   Any Other Special Instructions Will Be Listed Below (If Applicable).     If you need a refill on your cardiac medications before your next appointment, please call your pharmacy.    Heart Failure  Weigh yourself every morning when you first wake up and record on a calender or note pad, bring this to your office visits. Using a pill tender can help with taking your medications consistently.  Limit your fluid intake to 2 liters daily  Limit your sodium intake to less than 2-3 grams daily. Ask if you need dietary teaching.  If you gain more than 3 pounds (from your dry weight ), double your dose of diuretic for the day.  If you gain more than 5 pounds (from your dry weight), double your dose of lasix and call your heart failure doctor.  Please do not smoke tobacco since it is very bad for your heart.  Please do not drink alcohol since it can worsen your heart failure.Also avoid OTC nonsteroidal drugs, such as advil, aleve and motrin.  Try to exercise for at least 30 minutes every day because this will help your heart be more efficient. You may be eligible for supervised cardiac rehab, ask your physician.  DASH diet: Healthy eating to lower your blood pressure The DASH diet emphasizes portion size, eating a variety of foods and getting the right amount of nutrients. Discover how DASH can improve your health and lower your blood pressure. By [facility] Staff  DASH stands for Dietary Approaches to Stop Hypertension. The DASH  diet is a lifelong approach to healthy eating that's designed to help treat or prevent high blood pressure (hypertension). The DASH diet encourages you to reduce the sodium in your diet and eat a variety of foods rich in nutrients that help lower blood pressure, such as potassium, calcium and magnesium. By following the DASH diet, you may be able to reduce your blood pressure by a few points in just two weeks. Over time, your systolic blood pressure could drop by eight to 14 points, which can make a significant difference in your health risks. Because the DASH diet is a healthy way of eating, it offers health benefits besides just lowering blood pressure. The DASH diet is also in line with dietary recommendations to prevent osteoporosis, cancer, heart disease, stroke and diabetes. DASH diet: Sodium levels The DASH diet emphasizes vegetables, fruits and low-fat dairy foods - and moderate amounts of whole grains, fish, poultry and nuts. In addition to the standard DASH diet, there is also a lower sodium version of the diet. You can choose the version of the diet that meets your health needs: Standard DASH diet. You can consume up to 2,300 milligrams (mg) of sodium a day.  Lower sodium DASH diet. You can consume up to 1,500 mg of sodium a day. Both versions of the DASH diet aim to reduce the amount of sodium in your diet compared with what you might get in a typical American diet, which can amount to a whopping 3,400 mg of sodium  a day or more. The standard DASH diet meets the recommendation from the Dietary Guidelines for Americans to keep daily sodium intake to less than 2,300 mg a day. The American Heart Association recommends 1,500 mg a day of sodium as an upper limit for all adults. If you aren't sure what sodium level is right for you, talk to your doctor. DASH diet: What to eat Both versions of the DASH diet include lots of whole grains, fruits, vegetables and low-fat dairy products. The DASH diet  also includes some fish, poultry and legumes, and encourages a small amount of nuts and seeds a few times a week.  You can eat red meat, sweets and fats in small amounts. The DASH diet is low in saturated fat, cholesterol and total fat. Here's a look at the recommended servings from each food group for the 2,000-calorie-a-day DASH diet. Grains: 6 to 8 servings a day Grains include bread, cereal, rice and pasta. Examples of one serving of grains include 1 slice whole-wheat bread, 1 ounce dry cereal, or 1/2 cup cooked cereal, rice or pasta. Focus on whole grains because they have more fiber and nutrients than do refined grains. For instance, use brown rice instead of white rice, whole-wheat pasta instead of regular pasta and whole-grain bread instead of white bread. Look for products labeled "100 percent whole grain" or "100 percent whole wheat."  Grains are naturally low in fat. Keep them this way by avoiding butter, cream and cheese sauces. Vegetables: 4 to 5 servings a day Tomatoes, carrots, broccoli, sweet potatoes, greens and other vegetables are full of fiber, vitamins, and such minerals as potassium and magnesium. Examples of one serving include 1 cup raw leafy green vegetables or 1/2 cup cut-up raw or cooked vegetables. Don't think of vegetables only as side dishes - a hearty blend of vegetables served over brown rice or whole-wheat noodles can serve as the main dish for a meal.  Fresh and frozen vegetables are both good choices. When buying frozen and canned vegetables, choose those labeled as low sodium or without added salt.  To increase the number of servings you fit in daily, be creative. In a stir-fry, for instance, cut the amount of meat in half and double up on the vegetables. Fruits: 4 to 5 servings a day Many fruits need little preparation to become a healthy part of a meal or snack. Like vegetables, they're packed with fiber, potassium and magnesium and are typically low in fat -  coconuts are an exception. Examples of one serving include one medium fruit, 1/2 cup fresh, frozen or canned fruit, or 4 ounces of juice. Have a piece of fruit with meals and one as a snack, then round out your day with a dessert of fresh fruits topped with a dollop of low-fat yogurt.  Leave on edible peels whenever possible. The peels of apples, pears and most fruits with pits add interesting texture to recipes and contain healthy nutrients and fiber.  Remember that citrus fruits and juices, such as grapefruit, can interact with certain medications, so check with your doctor or pharmacist to see if they're OK for you.  If you choose canned fruit or juice, make sure no sugar is added. Dairy: 2 to 3 servings a day Milk, yogurt, cheese and other dairy products are major sources of calcium, vitamin D and protein. But the key is to make sure that you choose dairy products that are low fat or fat-free because otherwise they can be a major source  of fat - and most of it is saturated. Examples of one serving include 1 cup skim or 1 percent milk, 1 cup low fat yogurt, or 1 1/2 ounces part-skim cheese. Low-fat or fat-free frozen yogurt can help you boost the amount of dairy products you eat while offering a sweet treat. Add fruit for a healthy twist.  If you have trouble digesting dairy products, choose lactose-free products or consider taking an over-the-counter product that contains the enzyme lactase, which can reduce or prevent the symptoms of lactose intolerance.  Go easy on regular and even fat-free cheeses because they are typically high in sodium. Lean meat, poultry and fish: 6 servings or fewer a day Meat can be a rich source of protein, B vitamins, iron and zinc. Choose lean varieties and aim for no more than 6 ounces a day. Cutting back on your meat portion will allow room for more vegetables. Trim away skin and fat from poultry and meat and then bake, broil, grill or roast instead of frying in fat.   Eat heart-healthy fish, such as salmon, herring and tuna. These types of fish are high in omega-3 fatty acids, which can help lower your total cholesterol. Nuts, seeds and legumes: 4 to 5 servings a week Almonds, sunflower seeds, kidney beans, peas, lentils and other foods in this family are good sources of magnesium, potassium and protein. They're also full of fiber and phytochemicals, which are plant compounds that may protect against some cancers and cardiovascular disease. Serving sizes are small and are intended to be consumed only a few times a week because these foods are high in calories. Examples of one serving include 1/3 cup nuts, 2 tablespoons seeds, or 1/2 cup cooked beans or peas.  Nuts sometimes get a bad rap because of their fat content, but they contain healthy types of fat - monounsaturated fat and omega-3 fatty acids. They're high in calories, however, so eat them in moderation. Try adding them to stir-fries, salads or cereals.  Soybean-based products, such as tofu and tempeh, can be a good alternative to meat because they contain all of the amino acids your body needs to make a complete protein, just like meat. Fats and oils: 2 to 3 servings a day Fat helps your body absorb essential vitamins and helps your body's immune system. But too much fat increases your risk of heart disease, diabetes and obesity. The DASH diet strives for a healthy balance by limiting total fat to less than 30 percent of daily calories from fat, with a focus on the healthier monounsaturated fats. Examples of one serving include 1 teaspoon soft margarine, 1 tablespoon mayonnaise or 2 tablespoons salad dressing. Saturated fat and trans fat are the main dietary culprits in increasing your risk of coronary artery disease. DASH helps keep your daily saturated fat to less than 6 percent of your total calories by limiting use of meat, butter, cheese, whole milk, cream and eggs in your diet, along with foods made from  lard, solid shortenings, and palm and coconut oils.  Avoid trans fat, commonly found in such processed foods as crackers, baked goods and fried items.  Read food labels on margarine and salad dressing so that you can choose those that are lowest in saturated fat and free of trans fat. Sweets: 5 servings or fewer a week You don't have to banish sweets entirely while following the DASH diet - just go easy on them. Examples of one serving include 1 tablespoon sugar, jelly or jam, 1/2  cup sorbet, or 1 cup lemonade. When you eat sweets, choose those that are fat-free or low-fat, such as sorbets, fruit ices, jelly beans, hard candy, graham crackers or low-fat cookies.  Artificial sweeteners such as aspartame (NutraSweet, Equal) and sucralose (Splenda) may help satisfy your sweet tooth while sparing the sugar. But remember that you still must use them sensibly. It's OK to swap a diet cola for a regular cola, but not in place of a more nutritious beverage such as low-fat milk or even plain water.  Cut back on added sugar, which has no nutritional value but can pack on calories. DASH diet: Alcohol and caffeine Drinking too much alcohol can increase blood pressure. The Dietary Guidelines for Americans recommends that men limit alcohol to no more than two drinks a day and women to one or less. The DASH diet doesn't address caffeine consumption. The influence of caffeine on blood pressure remains unclear. But caffeine can cause your blood pressure to rise at least temporarily. If you already have high blood pressure or if you think caffeine is affecting your blood pressure, talk to your doctor about your caffeine consumption. DASH diet and weight loss While the DASH diet is not a weight-loss program, you may indeed lose unwanted pounds because it can help guide you toward healthier food choices. The DASH diet generally includes about 2,000 calories a day. If you're trying to lose weight, you may need to eat fewer  calories. You may also need to adjust your serving goals based on your individual circumstances - something your health care team can help you decide. Tips to cut back on sodium The foods at the core of the DASH diet are naturally low in sodium. So just by following the DASH diet, you're likely to reduce your sodium intake. You also reduce sodium further by: Using sodium-free spices or flavorings with your food instead of salt  Not adding salt when cooking rice, pasta or hot cereal  Rinsing canned foods to remove some of the sodium  Buying foods labeled "no salt added," "sodium-free," "low sodium" or "very low sodium" One teaspoon of table salt has 2,325 mg of sodium. When you read food labels, you may be surprised at just how much sodium some processed foods contain. Even low-fat soups, canned vegetables, ready-to-eat cereals and sliced [place] from the local deli - foods you may have considered healthy - often have lots of sodium. You may notice a difference in taste when you choose low-sodium food and beverages. If things seem too bland, gradually introduce low-sodium foods and cut back on table salt until you reach your sodium goal. That'll give your palate time to adjust. Using salt-free seasoning blends or herbs and spices may also ease the transition. It can take several weeks for your taste buds to get used to less salty foods. Putting the pieces of the DASH diet together Try these strategies to get started on the DASH diet:  Change gradually. If you now eat only one or two servings of fruits or vegetables a day, try to add a serving at lunch and one at dinner. Rather than switching to all whole grains, start by making one or two of your grain servings whole grains. Increasing fruits, vegetables and whole grains gradually can also help prevent bloating or diarrhea that may occur if you aren't used to eating a diet with lots of fiber. You can also try over-the-counter products to help reduce gas  from beans and vegetables.  Reward successes and forgive  slip-ups. Reward yourself with a nonfood treat for your accomplishments - rent a movie, purchase a book or get together with a friend. Everyone slips, especially when learning something new. Remember that changing your lifestyle is a long-term process. Find out what triggered your setback and then just pick up where you left off with the DASH diet.  Add physical activity. To boost your blood pressure lowering efforts even more, consider increasing your physical activity in addition to following the DASH diet. Combining both the DASH diet and physical activity makes it more likely that you'll reduce your blood pressure.  Get support if you need it. If you're having trouble sticking to your diet, talk to your doctor or dietitian about it. You might get some tips that will help you stick to the DASH diet. Remember, healthy eating isn't an all-or-nothing proposition. What's most important is that, on average, you eat healthier foods with plenty of variety - both to keep your diet nutritious and to avoid boredom or extremes. And with the DASH diet, you can have both.
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
## 2017-07-31 ENCOUNTER — Other Ambulatory Visit: Payer: Self-pay | Admitting: Urology

## 2017-07-31 DIAGNOSIS — R311 Benign essential microscopic hematuria: Secondary | ICD-10-CM

## 2017-08-14 ENCOUNTER — Ambulatory Visit
Admission: RE | Admit: 2017-08-14 | Discharge: 2017-08-14 | Disposition: A | Discharge status: Home / Self Care | Payer: BC Managed Care – PPO | Source: Ambulatory Visit | Attending: Urology | Admitting: Urology

## 2017-08-14 DIAGNOSIS — R311 Benign essential microscopic hematuria: Secondary | ICD-10-CM

## 2017-08-14 MED ORDER — IOPAMIDOL (ISOVUE-300) INJECTION 61%
150.0000 mL | Freq: Once | INTRAVENOUS | Status: AC | PRN
  Administered 2017-08-14: 150 mL via INTRAVENOUS

## 2017-10-05 LAB — GLUCOSE, POCT (MANUAL RESULT ENTRY): POC Glucose: 98 mg/dl (ref 70–99)

## 2018-03-28 LAB — POCT GLYCOSYLATED HEMOGLOBIN (HGB A1C): Hemoglobin A1C: 5.6 % (ref 4.0–5.6)

## 2018-03-28 LAB — GLUCOSE, POCT (MANUAL RESULT ENTRY): POC Glucose: 99 mg/dl (ref 70–99)

## 2018-06-02 NOTE — Progress Notes (Signed)
Cardiology Office Note:    Date:  06/03/2018   ID:  Colin Hartmann., DOB [birth year], MRN [id number]  PCP:  Raina Mina., MD  Cardiologist:  Shirlee More, MD    Referring MD: Raina Mina., MD    ASSESSMENT:    1. Chronic diastolic heart failure ([place])   2. Hypertensive heart failure ([place])   3. PAF (paroxysmal atrial fibrillation) ([place])   4. Chronic anticoagulation   5. Mixed hyperlipidemia    PLAN:    In order of problems listed above:  1. His heart failure is compensators New York Heart Association class I to continue his current loop diuretic 2. Stable continue his current antihypertensives 3. Stable no clinical recurrence I would keep him anticoagulated as he is hypercoagulable unprovoked pulmonary embolism and has had documented atrial fibrillation 4. Continue his anticoagulant no bleeding complication 5. Stable continue with statin his lipid profile in June showed cholesterol 146 LDL 74 HDL 66 and a CMP was normal at that time including creatinine 0.87 potassium 3.8 and liver function   Next appointment: 1 year or sooner if he is having clinical recurrences of atrial fibrillation   Medication Adjustments/Labs and Tests Ordered: Current medicines are reviewed at length with the patient today.  Concerns regarding medicines are outlined above.  No orders of the defined types were placed in this encounter.  No orders of the defined types were placed in this encounter.   Chief Complaint  Patient presents with  . Follow-up  . Congestive Heart Failure  . Hypertension  . Atrial Fibrillation  . Anticoagulation    History of Present Illness:    Colin Moening. is a 57 y.o. male with a hx of  chronic diastolic CHF, HTN, PAF  and an unprovoked pulmonary embolism on chronic anticoagulation  last seen 02/06/17. Compliance with diet, lifestyle and medications: Yes  Colin Hall is had a good year his limitation is chronic knee pain not edema shortness of breath no  orthopnea chest pain palpitation or syncope continues on his full dose anticoagulant with no bleeding complication.  He is due to have labs done his PCP office in the next month.  He is unaware of atrial fibrillation and remains in sinus rhythm Past Medical History:  Diagnosis Date  . Acute diastolic CHF (congestive heart failure) ([place]) 07/21/2014  . Anemia, chronic disease 12/25/2015  . Arthritis    "back" (07/20/2014)  . Atrial fibrillation with rapid ventricular response ([place]) 07/20/2014  . Bilateral pleural effusion 07/21/2014  . Cellulitis 09/19/2016  . Chronic anemia   . Chronic anticoagulation 01/14/2015  . Chronic diastolic heart failure ([place]) 01/14/2015  . Chronic gout of multiple sites 12/25/2015  . Coronary artery calcification seen on CT scan 01/14/2015   Overview:  Dense calcifications on CT and normal coranaries on catheterization Jan 2016  . DDD (degenerative disc disease), lumbosacral 12/25/2015  . Demand ischemia ([place]) 07/20/2014  . False positive serological test for hepatitis C 01/16/2017  . Folic acid deficiency 2/35/3614  . Glaucoma   . Glaucoma 12/25/2015  . Gout   . Hematuria 01/16/2016   Overview:  [place]  . High risk medication use 12/25/2015  . HTN (hypertension) 07/20/2014  . Hypertension   . Kidney cyst, acquired 01/16/2017   Overview:  Complex. Felt benign by [place]  . Kidney stone 12/25/2015  . Kidney stones    "multiple"  . Malaise and fatigue 12/25/2015  . Mild CAD 07/21/2014  . Mixed hyperlipidemia 12/25/2015  . Morbid  obesity ([place]) 08/29/2010   pt is 500 lbs  . Morbid obesity with BMI of 60.0-69.9, adult ([place]) 12/25/2015  . New onset atrial fibrillation ([place]) 07/19/2014   w/RVR  . OSA on CPAP   . Osteoarthritis of multiple joints 12/25/2015  . PAF (paroxysmal atrial fibrillation) ([place]) 01/14/2015  . Prediabetes 12/25/2015  . Pulmonary emboli ([place]) 07/21/2014  . UTI (urinary tract infection) 07/20/2014    Past Surgical History:  Procedure Laterality Date  .  CARDIAC CATHETERIZATION  03/2013   "no blockages"  . CYSTOSCOPY W/ STONE MANIPULATION  ~ 2010  . LAPAROSCOPIC GASTRIC SLEEVE RESECTION  08/2013   "gastric sleeve"  . LEFT HEART CATHETERIZATION WITH CORONARY ANGIOGRAM N/A 07/20/2014   Procedure: LEFT HEART CATHETERIZATION WITH CORONARY ANGIOGRAM;  Surgeon: Wellington Hampshire, MD;  Location: [facility];  Service: Cardiovascular;  Laterality: N/A;  . TONSILLECTOMY  05/1965    Current Medications: Current Meds  Medication Sig  . acetaminophen (TYLENOL) 500 MG tablet Take 1,000 mg by mouth every 6 (six) hours as needed (pain).  Marland Kitchen allopurinol (ZYLOPRIM) 100 MG tablet Take 200 mg by mouth every morning.   . diltiazem (DILACOR XR) 120 MG 24 hr capsule Take 1 capsule (120 mg total) by mouth daily.  . furosemide (LASIX) 20 MG tablet Take 1 tablet (20 mg total) by mouth daily.  Marland Kitchen losartan (COZAAR) 50 MG tablet Take 50 mg by mouth daily.  . metoprolol tartrate (LOPRESSOR) 25 MG tablet Take 1 tablet (25 mg total) by mouth 2 (two) times daily.  . Multiple Vitamin (MULTI-VITAMIN DAILY) TABS Take 1 tablet by mouth daily.  . mupirocin ointment (BACTROBAN) 2 % Apply 1 application topically every evening. Apply to stomach  . rivaroxaban (XARELTO) 20 MG TABS tablet Take 20 mg by mouth daily.  . rosuvastatin (CRESTOR) 10 MG tablet TAKE 1 TABLET BY MOUTH ONCE DAILY     Allergies:   Patient has no known allergies.   Social History   Socioeconomic History  . Marital status: Single    Spouse name: Not on file  . Number of children: Not on file  . Years of education: Not on file  . Highest education level: Not on file  Occupational History  . Not on file  Social Needs  . Financial resource strain: Not on file  . Food insecurity:    Worry: Not on file    Inability: Not on file  . Transportation needs:    Medical: Not on file    Non-medical: Not on file  Tobacco Use  . Smoking status: Never Smoker  . Smokeless tobacco: Never Used  Substance and  Sexual Activity  . Alcohol use: Yes    Comment: 07/20/2014 "might drink q 2-3 years"  . Drug use: Yes    Types: Marijuana    Comment: "in my college years"  . Sexual activity: Not Currently  Lifestyle  . Physical activity:    Days per week: Not on file    Minutes per session: Not on file  . Stress: Not on file  Relationships  . Social connections:    Talks on phone: Not on file    Gets together: Not on file    Attends religious service: Not on file    Active member of club or organization: Not on file    Attends meetings of clubs or organizations: Not on file    Relationship status: Not on file  Other Topics Concern  . Not on file  Social History Narrative  .  Not on file     Family History: The patient's family history includes Dementia in his mother; Diabetes in his father; Heart attack in his daughter. ROS:   Please see the history of present illness.    All other systems reviewed and are negative.  EKGs/Labs/Other Studies Reviewed:    The following studies were reviewed today:  EKG:  EKG ordered today.  The ekg ordered today demonstrates [place] incomplete RBBB  Recent Labs:   12/02/2017 CMP is normal potassium 3.8 creatinine 0.87 and lipid profile with a cholesterol 146 LDL 74 HDL 66 estimated GFR greater than 90 cc/min hemoglobin 11.8 normal MCV platelets 209,000 No results found for requested labs within last 8760 hours.  Recent Lipid Panel    Component Value Date/Time   CHOL 114 07/22/2014 0330   TRIG 43 07/22/2014 0330   HDL 30 (L) 07/22/2014 0330   CHOLHDL 3.8 07/22/2014 0330   VLDL 9 07/22/2014 0330   LDLCALC 75 07/22/2014 0330    Physical Exam:    VS:  BP (!) 146/88 (BP Location: Left Arm, Patient Position: Sitting, Cuff Size: Large)   Pulse 69   Ht 5\' 9"  (1.753 m)   Wt (!) 513 lb 6.4 oz (232.9 kg)   SpO2 97%   BMI 75.82 kg/m     Wt Readings from Last 3 Encounters:  06/03/18 (!) 513 lb 6.4 oz (232.9 kg)  02/06/17 (!) 476 lb (215.9 kg)  09/18/16  (!) 480 lb (217.7 kg)     GEN:  Well nourished, well developed in no acute distress HEENT: Normal NECK: No JVD; No carotid bruits LYMPHATICS: No lymphadenopathy CARDIAC: RRR, no murmurs, rubs, gallops RESPIRATORY:  Clear to auscultation without rales, wheezing or rhonchi  ABDOMEN: Soft, non-tender, non-distended MUSCULOSKELETAL:  No edema; No deformity  SKIN: Warm and dry NEUROLOGIC:  Alert and oriented x 3 PSYCHIATRIC:  Normal affect    Signed, Shirlee More, MD  06/03/2018 1:54 PM    [facility]

## 2018-06-03 ENCOUNTER — Ambulatory Visit (INDEPENDENT_AMBULATORY_CARE_PROVIDER_SITE_OTHER): Payer: BC Managed Care – PPO | Admitting: Cardiology

## 2018-06-03 ENCOUNTER — Encounter: Payer: Self-pay | Admitting: Cardiology

## 2018-06-03 VITALS — BP 146/88 | HR 69 | Ht 69.0 in | Wt >= 6400 oz

## 2018-06-03 DIAGNOSIS — Z7901 Long term (current) use of anticoagulants: Secondary | ICD-10-CM | POA: Diagnosis not present

## 2018-06-03 DIAGNOSIS — I110 Hypertensive heart disease with heart failure: Secondary | ICD-10-CM | POA: Diagnosis not present

## 2018-06-03 DIAGNOSIS — I480 Paroxysmal atrial fibrillation: Secondary | ICD-10-CM

## 2018-06-03 DIAGNOSIS — I5032 Chronic diastolic (congestive) heart failure: Secondary | ICD-10-CM

## 2018-06-03 DIAGNOSIS — E782 Mixed hyperlipidemia: Secondary | ICD-10-CM

## 2018-06-03 NOTE — Patient Instructions (Addendum)
Medication Instructions:  Your physician recommends that you continue on your current medications as directed. Please refer to the Current Medication list given to you today.  If you need a refill on your cardiac medications before your next appointment, please call your pharmacy.   Lab work: None  If you have labs (blood work) drawn today and your tests are completely normal, you will receive your results only by: Marland Kitchen MyChart Message (if you have MyChart) OR . A paper copy in the mail If you have any lab test that is abnormal or we need to change your treatment, we will call you to review the results.  Testing/Procedures: You had an EKG today.   Follow-Up: At [facility], you and your health needs are our priority.  As part of our continuing mission to provide you with exceptional heart care, we have created designated Provider Care Teams.  These Care Teams include your primary Cardiologist (physician) and Advanced Practice Providers (APPs -  Physician Assistants and Nurse Practitioners) who all work together to provide you with the care you need, when you need it. You will need a follow up appointment in 1 years.  Please call our office 2 months in advance to schedule this appointment.        Heart Failure  Weigh yourself every morning when you first wake up and record on a calender or note pad, bring this to your office visits. Using a pill tender can help with taking your medications consistently.  Limit your fluid intake to 2 liters daily  Limit your sodium intake to less than 2-3 grams daily. Ask if you need dietary teaching.  If you gain more than 3 pounds (from your dry weight ), double your dose of diuretic for the day.  If you gain more than 5 pounds (from your dry weight), double your dose of lasix and call your heart failure doctor.  Please do not smoke tobacco since it is very bad for your heart.  Please do not drink alcohol since it can worsen your heart  failure.Also avoid OTC nonsteroidal drugs, such as advil, aleve and motrin.  Try to exercise for at least 30 minutes every day because this will help your heart be more efficient. You may be eligible for supervised cardiac rehab, ask your physician.

## 2019-12-02 ENCOUNTER — Ambulatory Visit: Payer: BC Managed Care – PPO | Admitting: Sports Medicine

## 2019-12-02 ENCOUNTER — Other Ambulatory Visit: Payer: Self-pay

## 2019-12-02 DIAGNOSIS — M79674 Pain in right toe(s): Secondary | ICD-10-CM | POA: Diagnosis not present

## 2019-12-02 DIAGNOSIS — I739 Peripheral vascular disease, unspecified: Secondary | ICD-10-CM | POA: Diagnosis not present

## 2019-12-02 DIAGNOSIS — R202 Paresthesia of skin: Secondary | ICD-10-CM | POA: Diagnosis not present

## 2019-12-02 DIAGNOSIS — Z7901 Long term (current) use of anticoagulants: Secondary | ICD-10-CM

## 2019-12-02 DIAGNOSIS — M722 Plantar fascial fibromatosis: Secondary | ICD-10-CM

## 2019-12-02 DIAGNOSIS — E611 Iron deficiency: Secondary | ICD-10-CM | POA: Insufficient documentation

## 2019-12-02 DIAGNOSIS — M79675 Pain in left toe(s): Secondary | ICD-10-CM

## 2019-12-02 DIAGNOSIS — B351 Tinea unguium: Secondary | ICD-10-CM

## 2019-12-02 DIAGNOSIS — I890 Lymphedema, not elsewhere classified: Secondary | ICD-10-CM

## 2019-12-02 HISTORY — DX: Iron deficiency: E61.1

## 2019-12-02 NOTE — Progress Notes (Signed)
Subjective: Colin Hall. is a 59 y.o. male patient seen today in office with complaint of mildly painful thickened and elongated toenails; unable to trim. Patient denies history of Diabetes does admit to a history of PE on Xarelto.  Patient also admits to some occasional pain in her arches for the last month diagnosed 20 years ago with plantar fasciitis pain is worse with walking or sitting has tried resting and Tylenol that helps some and admits that there is also pins and needle pain to the area. Patient has no other pedal complaints at this time.   Review of Systems  All other systems reviewed and are negative.   Patient Active Problem List   Diagnosis Date Noted  . Iron deficiency 12/02/2019  . False positive serological test for hepatitis C 01/16/2017  . Kidney cyst, acquired 01/16/2017  . Cellulitis 09/19/2016  . Cellulitis of left lower extremity 09/19/2016  . Hematuria 01/16/2016  . Anemia, chronic disease 12/25/2015  . Kidney stone 12/25/2015  . Chronic gout of multiple sites 12/25/2015  . DDD (degenerative disc disease), lumbosacral 12/25/2015  . Folic acid deficiency 20/25/4270  . Glaucoma 12/25/2015  . High risk medication use 12/25/2015  . Malaise and fatigue 12/25/2015  . Mixed hyperlipidemia 12/25/2015  . Adult BMI >=70 kg/sq m ([place]) 12/25/2015  . Osteoarthritis of multiple joints 12/25/2015  . Prediabetes 12/25/2015  . Chronic anticoagulation 01/14/2015  . Chronic diastolic heart failure ([place]) 01/14/2015  . Coronary artery calcification seen on CT scan 01/14/2015  . PAF (paroxysmal atrial fibrillation) ([place]) 01/14/2015  . Pulmonary emboli ([place]) 07/21/2014  . Acute diastolic CHF (congestive heart failure) ([place]) 07/21/2014  . Bilateral pleural effusion 07/21/2014  . Mild CAD 07/21/2014  . Morbid obesity ([place]) 07/20/2014  . UTI (urinary tract infection) 07/20/2014  . Demand ischemia ([place]) 07/20/2014  . Hypertensive heart failure ([place]) 07/20/2014  . OSA on CPAP  07/20/2014    Current Outpatient Medications on File Prior to Visit  Medication Sig Dispense Refill  . acetaminophen (TYLENOL) 500 MG tablet Take 1,000 mg by mouth every 6 (six) hours as needed (pain).    Marland Kitchen allopurinol (ZYLOPRIM) 100 MG tablet Take 200 mg by mouth every morning.     . furosemide (LASIX) 20 MG tablet Take 1 tablet (20 mg total) by mouth daily. 30 tablet 11  . losartan (COZAAR) 50 MG tablet Take 50 mg by mouth daily.  3  . metoprolol tartrate (LOPRESSOR) 25 MG tablet Take 1 tablet (25 mg total) by mouth 2 (two) times daily. 60 tablet 11  . Multiple Vitamin (MULTI-VITAMIN DAILY) TABS Take 1 tablet by mouth daily.    . mupirocin ointment (BACTROBAN) 2 % Apply 1 application topically every evening. Apply to stomach    . rivaroxaban (XARELTO) 20 MG TABS tablet Take 20 mg by mouth daily.    . rosuvastatin (CRESTOR) 10 MG tablet TAKE 1 TABLET BY MOUTH ONCE DAILY     No current facility-administered medications on file prior to visit.    No Known Allergies  Objective: Physical Exam  General: Well developed, nourished, no acute distress, awake, alert and oriented x 3  Vascular: Dorsalis pedis artery 0/4 bilateral, Posterior tibial artery 0/4 bilateral, due to lymphedema, skin temperature warm to warm proximal to distal bilateral lower extremities, trophic skin changes to legs consistent with lymphedema and significant hyperpigmentation, no hair present bilateral.  Neurological: Gross sensation present via light touch bilateral.  Vibratory and protective sensation intact bilateral.  Pedal sensation intact to  all sites using [employer] monofilament.  Subjective pins-and-needles to both feet.  Dermatological: Skin is warm, dry, and supple bilateral, Nails 1-10 are severely  long, thick, and discolored with mild subungal debris, no webspace macerations present bilateral, no open lesions present bilateral, + dry skin, No signs of infection bilateral.  Musculoskeletal: Pes  planus foot type bilateral.  No reproducible pain to the plantar fascial bilateral.   Assessment and Plan:  Problem List Items Addressed This Visit    None    Visit Diagnoses    Pain due to onychomycosis of toenails of both feet    -  Primary   PVD (peripheral vascular disease) ([place])       Long term current use of anticoagulant       Lymphedema       Pins and needles sensation       Bilateral plantar fasciitis       History       -Examined patient.  -Discussed treatment options for painful mycotic nails. -Mechanically debrided and reduced mycotic nails with sterile nail nipper and dremel nail file without incident. -Dispensed heel lifts and advised patient to get new shoes to help prevent plantar fascial symptoms from worsening -Advised daily stretching and icing as instructed -Advised patient to continue with medication management for edema -Advised patient the pins-and-needles sensation to both feet if worsens may consider adding on oral medication however at this time since symptoms are mild we will continue to monitor -Patient to return in 3 months for follow up evaluation or sooner if symptoms worsen.  Landis Martins, DPM

## 2019-12-19 IMAGING — CT CT ABD-PEL WO/W CM
4 of 6 series · 15 of 32 positions shown, 18 images · IV contrast (APPLIED)
Comparison: 07/26/2015 abdominal MRI.  07/18/2015 abdominopelvic CT

CLINICAL DATA: Microscopic hematuria. History of renal stones.
Morbid obesity. Prior gastric sleeve procedure.

EXAM:
CT ABDOMEN AND PELVIS WITHOUT AND WITH CONTRAST
TECHNIQUE: Multidetector CT imaging of the abdomen and pelvis was performed
following the standard protocol before and following the bolus
administration of intravenous contrast.
CONTRAST:  150mL [id number] IOPAMIDOL ([id number]) INJECTION 61%

[Series 3: lung without · axial · non-contrast · 0.98mm/px · z∈[-396,-174]mm · 4 of 259 slices shown (1 of 2)]
[im 37/259  bone]
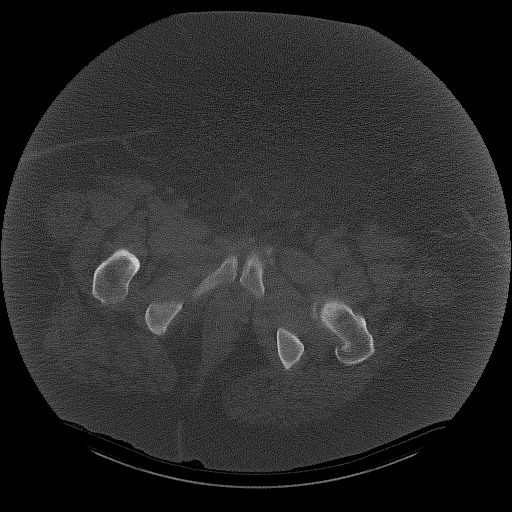
[im 74/259  bone]
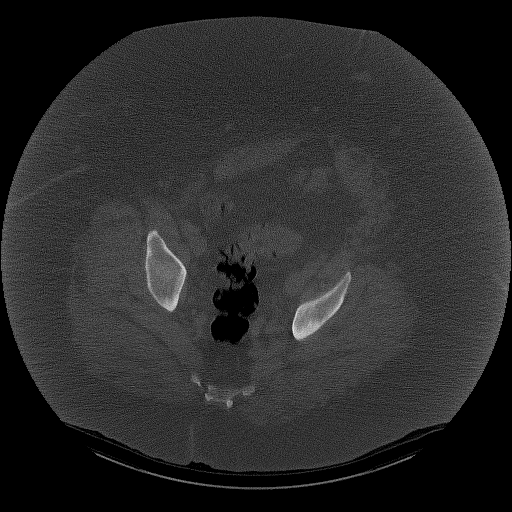
[im 111/259  bone]
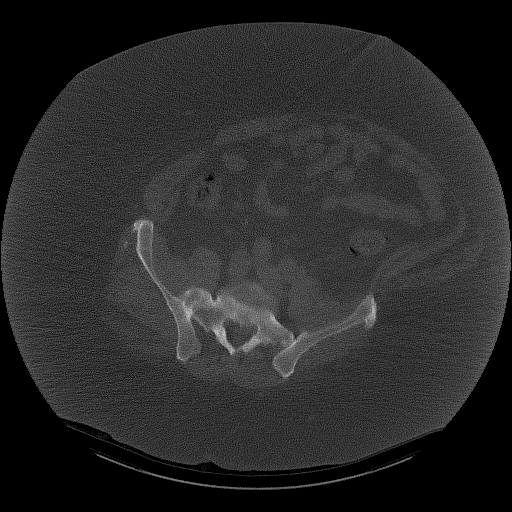
[im 148/259  bone]
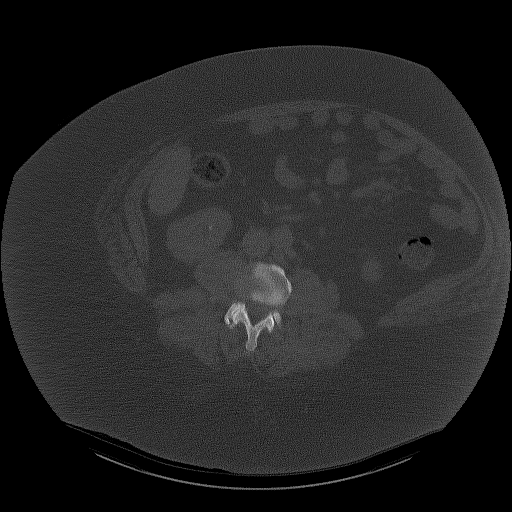

[Series 4: abd/pelvis w/(date) · axial · 0.98mm/px · z∈[-297,-127]mm · 2 of 104 slices shown, 5 images (1 of 2)]
[im 35/104  soft-tissue]
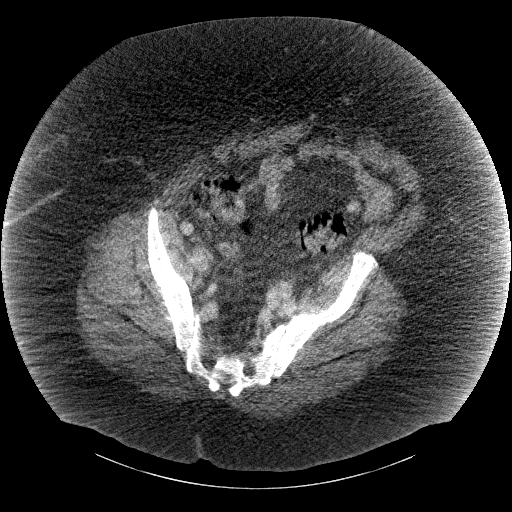
[im 35/104  lung]
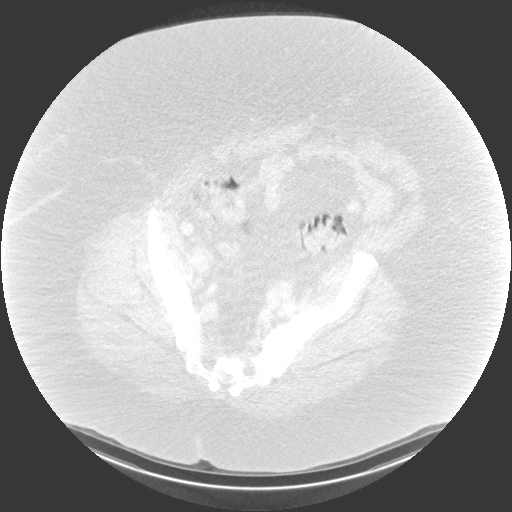
[im 35/104  bone]
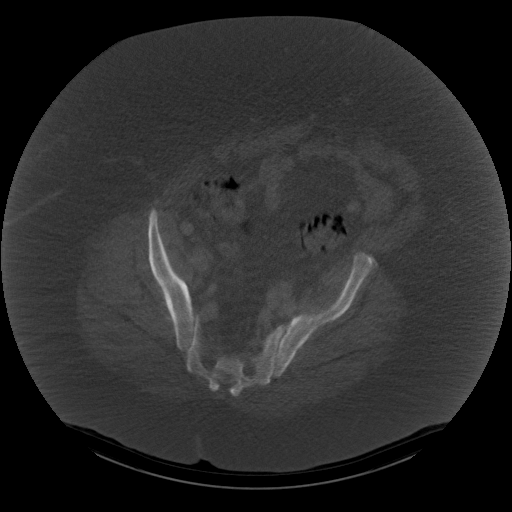
[im 69/104  soft-tissue]
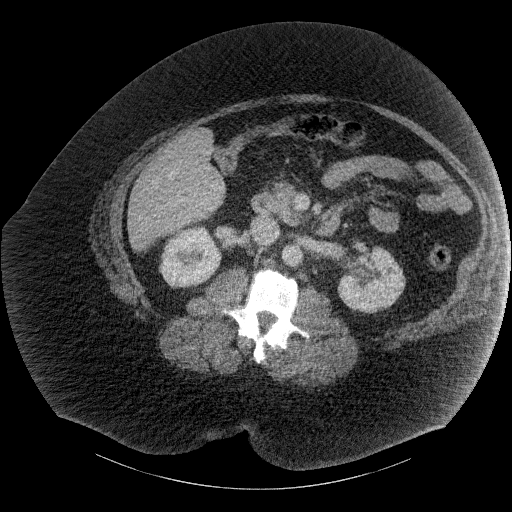
[im 69/104  lung]
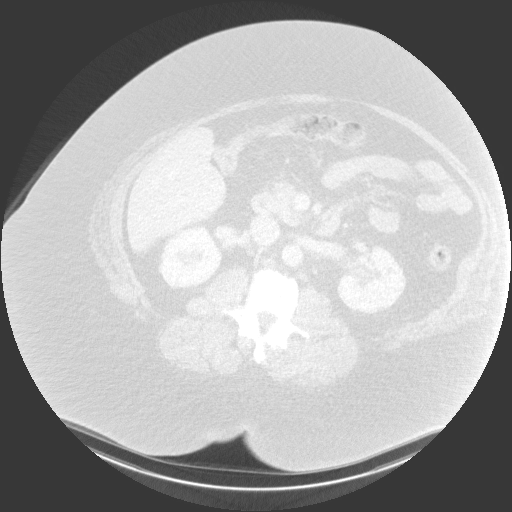

[Series 5: lung without · axial · non-contrast · 0.98mm/px · z∈[-404,-18]mm · 7 of 259 slices shown (2 of 2)]
[im 33/259  bone]
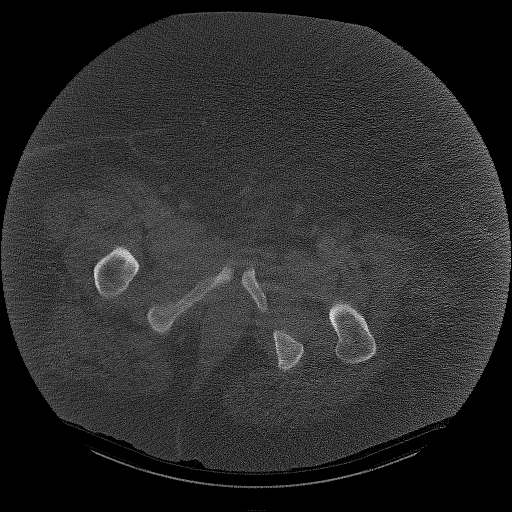
[im 65/259  bone]
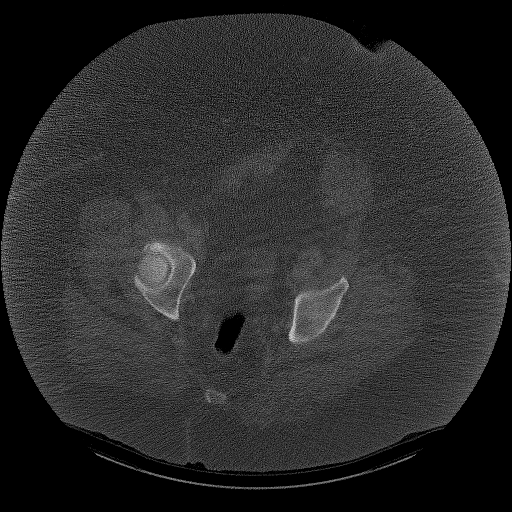
[im 97/259  bone]
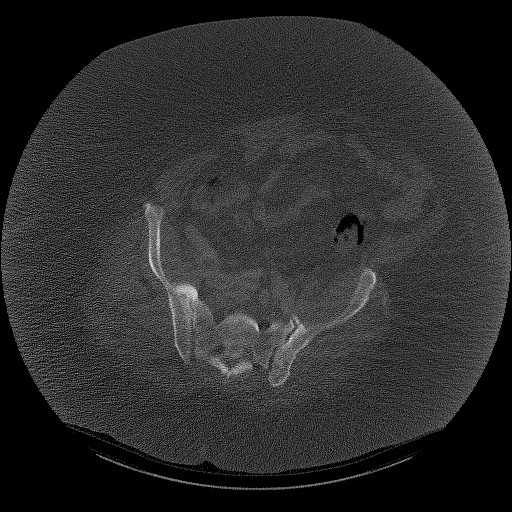
[im 130/259  bone]
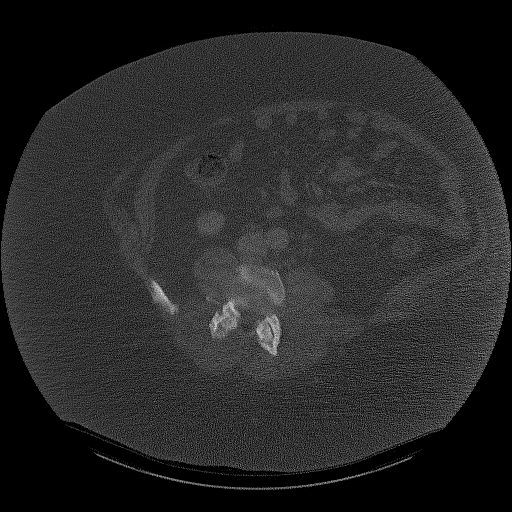
[im 162/259  bone]
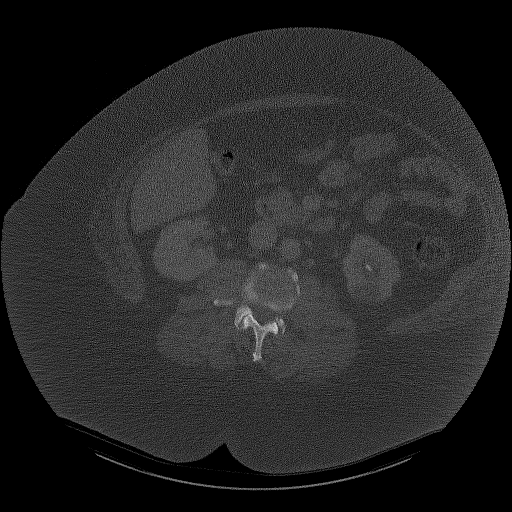
[im 194/259  bone]
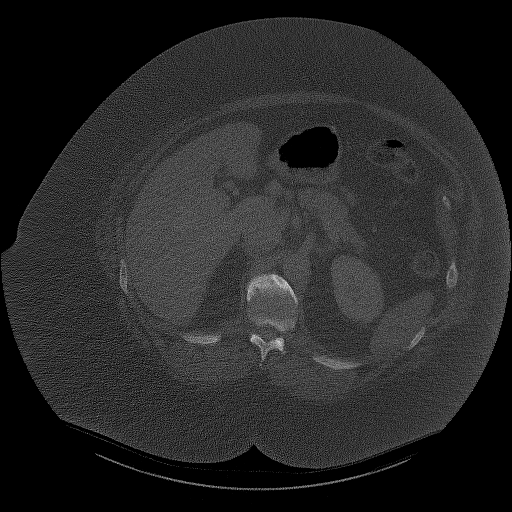
[im 226/259  bone]
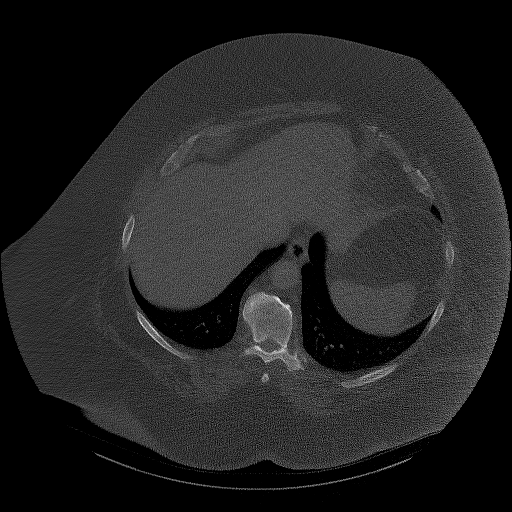

[Series 10: abd/pelvis w/(date) · axial · 0.98mm/px · z∈[-306,-131]mm · 2 of 107 slices shown (2 of 2)]
[im 36/107  soft-tissue]
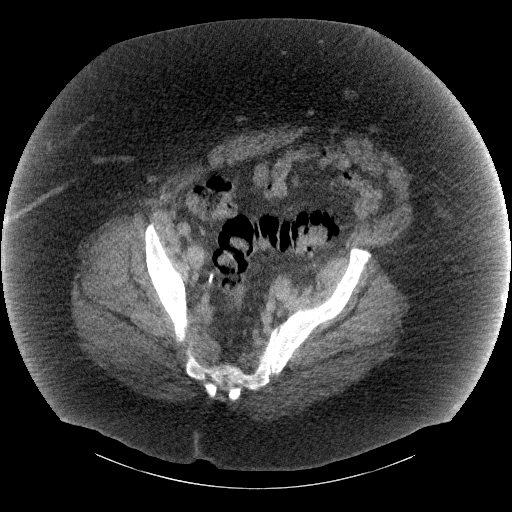
[im 71/107  soft-tissue]
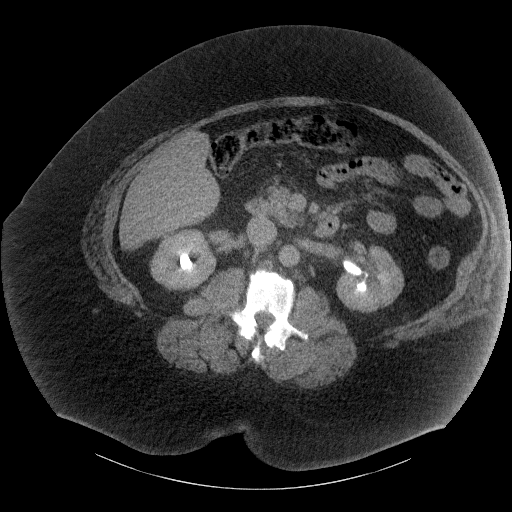

[15 of 32 positions shown; findings below may reference images not displayed]

FINDINGS: Lower chest: Minimal subsegmental atelectasis at the left lung base.
Normal heart size without pericardial or pleural effusion.

Hepatobiliary: Nonspecific mild caudate lobe enlargement.
Hepatomegaly at 22.1 cm craniocaudal. Normal gallbladder, without
biliary ductal dilatation.

Pancreas: Normal, without mass or ductal dilatation.

Spleen: Normal in size, without focal abnormality.

Adrenals/Urinary Tract: Normal adrenal glands.

Left larger and more numerous than right renal collecting system
calculi. The largest measures 12 mm in the interpolar left
collecting system. No hydronephrosis. No hydroureter or ureteric
calculi. No bladder calculi.

Interpolar left renal low-density lesion measures 2.2 cm and is
similar in size to 07/26/2015 MR, where it is more entirely
characterized. An upper pole right renal lesion which is too small
to characterize at 11 mm, including on image 36/series 10.

Good renal collecting system opacification on delayed images.
Moderate ureteric opacification, without filling defect. The bladder
is suboptimally evaluated secondary to patient size and beam
hardening artifact. No gross enhancing bladder mass and no dependent
bladder filling defect on delayed images.

Stomach/Bowel: Gastric sleeve procedure. Normal colon, appendix, and
terminal ileum. Normal small bowel.

Vascular/Lymphatic: Normal caliber of the aorta and branch vessels.
Small retroperitoneal nodes are similar and not pathologic by size
criteria. 11 mm right external iliac node is similar to 0546 and
likely reactive. There is borderline left external iliac adenopathy
which is also not significantly changed.

Reproductive: Grossly normal prostate.

Other: No significant free fluid.

Musculoskeletal: Degenerative partial fusion of the right sacroiliac
joint. Mild convex left lumbar spine curvature.
IMPRESSION: 1. Bilateral nephrolithiasis, without obstructive uropathy.
2. Degraded exam secondary to patient body habitus.
3. Left renal lesion which is more entirely characterized on
07/26/2015 MRI. An upper pole right renal lesion is incompletely
characterized today, but not readily apparent on that exam. Consider
follow-up pre and post contrast abdominal MRI or renal protocol CT
at 1 year.
4. Hepatomegaly.

## 2020-03-23 ENCOUNTER — Ambulatory Visit (INDEPENDENT_AMBULATORY_CARE_PROVIDER_SITE_OTHER): Payer: BC Managed Care – PPO | Admitting: Podiatry

## 2020-03-23 ENCOUNTER — Other Ambulatory Visit: Payer: Self-pay

## 2020-03-23 ENCOUNTER — Encounter: Payer: Self-pay | Admitting: Podiatry

## 2020-03-23 DIAGNOSIS — L84 Corns and callosities: Secondary | ICD-10-CM | POA: Diagnosis not present

## 2020-03-23 DIAGNOSIS — M79675 Pain in left toe(s): Secondary | ICD-10-CM | POA: Diagnosis not present

## 2020-03-23 DIAGNOSIS — I739 Peripheral vascular disease, unspecified: Secondary | ICD-10-CM

## 2020-03-23 DIAGNOSIS — M79674 Pain in right toe(s): Secondary | ICD-10-CM | POA: Diagnosis not present

## 2020-03-23 DIAGNOSIS — I890 Lymphedema, not elsewhere classified: Secondary | ICD-10-CM

## 2020-03-23 DIAGNOSIS — B351 Tinea unguium: Secondary | ICD-10-CM

## 2020-03-23 NOTE — Patient Instructions (Signed)
Recommend Skechers Loafers with stretchable uppers and memory foam insoles. They can be purchased at [employer], [employer], [employer] or [employer]. Also on [email or web address].

## 2020-03-27 NOTE — Progress Notes (Signed)
Subjective:  Patient ID: Colin Hall., male    DOB: 1960-10-31,  MRN: [id number]  Colin Hall. presents to clinic today for for at risk foot care. Patient has h/o PAD and painful corn(s) left 5th digit , callus(es) b/l feet and painful mycotic nails.  Pain interferes with ambulation. Aggravating factors include wearing enclosed shoe gear. Painful toenails interfere with ambulation. Aggravating factors include wearing enclosed shoe gear. Pain is relieved with periodic professional debridement. Painful corns and calluses are aggravated when weightbearing with and without shoegear. Pain is relieved with periodic professional debridement..  59 y.o. male presents with the above complaint.  PCP is Dr. [name]. Last visit was 12/22/2019.  Review of Systems: Negative except as noted in the HPI. Past Medical History:  Diagnosis Date  . Acute diastolic CHF (congestive heart failure) ([place]) 07/21/2014  . Anemia, chronic disease 12/25/2015  . Arthritis    "back" (07/20/2014)  . Atrial fibrillation with rapid ventricular response ([place]) 07/20/2014  . Bilateral pleural effusion 07/21/2014  . Cellulitis 09/19/2016  . Chronic anemia   . Chronic anticoagulation 01/14/2015  . Chronic diastolic heart failure ([place]) 01/14/2015  . Chronic gout of multiple sites 12/25/2015  . Coronary artery calcification seen on CT scan 01/14/2015   Overview:  Dense calcifications on CT and normal coranaries on catheterization Jan 2016  . DDD (degenerative disc disease), lumbosacral 12/25/2015  . Demand ischemia ([place]) 07/20/2014  . False positive serological test for hepatitis C 01/16/2017  . Folic acid deficiency 2/99/2426  . Glaucoma   . Glaucoma 12/25/2015  . Gout   . Hematuria 01/16/2016   Overview:  [place]  . High risk medication use 12/25/2015  . HTN (hypertension) 07/20/2014  . Hypertension   . Kidney cyst, acquired 01/16/2017   Overview:  Complex. Felt benign by [place]  . Kidney stone 12/25/2015  . Kidney stones     "multiple"  . Malaise and fatigue 12/25/2015  . Mild CAD 07/21/2014  . Mixed hyperlipidemia 12/25/2015  . Morbid obesity ([place]) 08/29/2010   pt is 500 lbs  . Morbid obesity with BMI of 60.0-69.9, adult ([place]) 12/25/2015  . New onset atrial fibrillation ([place]) 07/19/2014   w/RVR  . OSA on CPAP   . Osteoarthritis of multiple joints 12/25/2015  . PAF (paroxysmal atrial fibrillation) ([place]) 01/14/2015  . Prediabetes 12/25/2015  . Pulmonary emboli ([place]) 07/21/2014  . UTI (urinary tract infection) 07/20/2014   Past Surgical History:  Procedure Laterality Date  . CARDIAC CATHETERIZATION  03/2013   "no blockages"  . CYSTOSCOPY W/ STONE MANIPULATION  ~ 2010  . LAPAROSCOPIC GASTRIC SLEEVE RESECTION  08/2013   "gastric sleeve"  . LEFT HEART CATHETERIZATION WITH CORONARY ANGIOGRAM N/A 07/20/2014   Procedure: LEFT HEART CATHETERIZATION WITH CORONARY ANGIOGRAM;  Surgeon: Wellington Hampshire, MD;  Location: [facility];  Service: Cardiovascular;  Laterality: N/A;  . TONSILLECTOMY  05/1965    Current Outpatient Medications:  .  latanoprost (XALATAN) 0.005 % ophthalmic solution, Place 1 drop into both eyes nightly., Disp: , Rfl:  .  acetaminophen (TYLENOL) 500 MG tablet, Take 1,000 mg by mouth every 6 (six) hours as needed (pain)., Disp: , Rfl:  .  allopurinol (ZYLOPRIM) 100 MG tablet, Take 200 mg by mouth every morning. , Disp: , Rfl:  .  diltiazem (CARDIZEM CD) 120 MG 24 hr capsule, Take 120 mg by mouth daily., Disp: , Rfl:  .  furosemide (LASIX) 20 MG tablet, Take 1 tablet (20 mg total) by mouth daily.,  Disp: 30 tablet, Rfl: 11 .  HYDROcodone-acetaminophen (HYCET) 7.5-325 mg/15 ml solution, hydrocodone 7.5 mg-acetaminophen 325 mg/15 mL oral solution, Disp: , Rfl:  .  losartan (COZAAR) 50 MG tablet, Take 50 mg by mouth daily., Disp: , Rfl: 3 .  metoprolol tartrate (LOPRESSOR) 25 MG tablet, Take 1 tablet (25 mg total) by mouth 2 (two) times daily., Disp: 60 tablet, Rfl: 11 .  Multiple Vitamin (MULTI-VITAMIN  DAILY) TABS, Take 1 tablet by mouth daily., Disp: , Rfl:  .  mupirocin ointment (BACTROBAN) 2 %, Apply 1 application topically every evening. Apply to stomach, Disp: , Rfl:  .  MYRBETRIQ 50 MG TB24 tablet, Take 50 mg by mouth daily., Disp: , Rfl:  .  rivaroxaban (XARELTO) 20 MG TABS tablet, Take 20 mg by mouth daily., Disp: , Rfl:  .  rosuvastatin (CRESTOR) 10 MG tablet, TAKE 1 TABLET BY MOUTH ONCE DAILY, Disp: , Rfl:  .  tamsulosin (FLOMAX) 0.4 MG CAPS capsule, Take 0.4 mg by mouth daily., Disp: , Rfl:  No Known Allergies Social History   Occupational History  . Not on file  Tobacco Use  . Smoking status: Never Smoker  . Smokeless tobacco: Never Used  Vaping Use  . Vaping Use: Never used  Substance and Sexual Activity  . Alcohol use: Yes    Comment: 07/20/2014 "might drink q 2-3 years"  . Drug use: Yes    Types: Marijuana    Comment: "in my college years"  . Sexual activity: Not Currently    Objective:   Constitutional Razi Hickle. is a pleasant 59 y.o. African American male, morbidly obese in NAD.Marland Kitchen AAO x 3.   Vascular Capillary fill time to digits <3 seconds b/l lower extremities. Nonpalpable DP pulse(s) b/l lower extremities. Nonpalpable PT pulse(s) b/l lower extremities. Pedal hair absent. Lower extremity skin temperature gradient within normal limits. Lymphedema present b/l lower extremities. No ischemia or gangrene noted b/l lower extremities. No cyanosis or clubbing noted.  Neurologic Normal speech. Oriented to person, place, and time. Epicritic sensation to light touch grossly present bilaterally. Protective sensation intact 5/5 intact bilaterally with 10g monofilament b/l. Vibratory sensation intact b/l.  Dermatologic Pedal skin with normal turgor, texture and tone bilaterally. No open wounds bilaterally. No interdigital macerations bilaterally. Toenails 1-5 b/l elongated, discolored, dystrophic, thickened, crumbly with subungual debris and tenderness to dorsal palpation.  Hyperkeratotic lesion(s) L 5th toe, submet head 5 left foot and submet head 5 right foot.  No erythema, no edema, no drainage, no flocculence.  Orthopedic: Normal muscle strength 5/5 to all lower extremity muscle groups bilaterally. No pain crepitus or joint limitation noted with ROM b/l. Pes planus deformity noted b/l.    Radiographs: None Assessment:  No diagnosis found. Plan:  Patient was evaluated and treated and all questions answered.  Onychomycosis with pain -Nails palliatively debridement as below -Educated on self-care  Procedure: Nail Debridement Rationale: Pain Type of Debridement: manual, sharp debridement. Instrumentation: Nail nipper, rotary burr. Number of Nails: 10  -Examined patient. -Toenails 1-5 b/l were debrided in length and girth with sterile nail nippers and dremel without iatrogenic bleeding.  -Recommend Skechers Loafers with stretchable uppers and memory foam insoles. They can be purchased at [employer], [employer] or [employer]. Also on [email or web address].  -Corn(s) L 5th toe and callus(es) submet head 5 left foot and submet head 5 right foot were pared utilizing sterile scalpel blade without incident. Total number debrided =3. -Patient to report any pedal injuries to medical professional immediately. -Patient to continue  soft, supportive shoe gear daily. -Patient/POA to call should there be question/concern in the interim.  Return in about 3 months (around 06/22/2020).  Marzetta Board, DPM

## 2020-06-22 ENCOUNTER — Ambulatory Visit (INDEPENDENT_AMBULATORY_CARE_PROVIDER_SITE_OTHER): Payer: BC Managed Care – PPO | Admitting: Podiatry

## 2020-06-22 ENCOUNTER — Other Ambulatory Visit: Payer: Self-pay

## 2020-06-22 DIAGNOSIS — I890 Lymphedema, not elsewhere classified: Secondary | ICD-10-CM

## 2020-06-22 DIAGNOSIS — M79675 Pain in left toe(s): Secondary | ICD-10-CM

## 2020-06-22 DIAGNOSIS — B351 Tinea unguium: Secondary | ICD-10-CM

## 2020-06-22 DIAGNOSIS — L84 Corns and callosities: Secondary | ICD-10-CM

## 2020-06-22 DIAGNOSIS — M79674 Pain in right toe(s): Secondary | ICD-10-CM

## 2020-06-22 DIAGNOSIS — I739 Peripheral vascular disease, unspecified: Secondary | ICD-10-CM

## 2020-06-30 ENCOUNTER — Encounter: Payer: Self-pay | Admitting: Podiatry

## 2020-06-30 NOTE — Progress Notes (Signed)
Subjective:  Patient ID: Colin Hall., male    DOB: [birth year],  MRN: [id number]  Colin Hall. presents to clinic today for for at risk foot care. Patient has h/o PAD and painful corn(s) left 5th digit , callus(es) b/l feet and painful mycotic nails.  Pain interferes with ambulation. Aggravating factors include wearing enclosed shoe gear. Painful toenails interfere with ambulation. Aggravating factors include wearing enclosed shoe gear. Pain is relieved with periodic professional debridement. Painful corns and calluses are aggravated when weightbearing with and without shoegear. Pain is relieved with periodic professional debridement..  Patient states insurance did not cover treatment of corns and calluses from last visit.  He is requesting they do not be debrided on today's visit.  59 y.o. male presents with the above complaint.  PCP is Dr. [name]. Last visit was 12/22/2019.  Review of Systems: Negative except as noted in the HPI. Past Medical History:  Diagnosis Date  . Acute diastolic CHF (congestive heart failure) ([place]) 07/21/2014  . Anemia, chronic disease 12/25/2015  . Arthritis    "back" (07/20/2014)  . Atrial fibrillation with rapid ventricular response ([place]) 07/20/2014  . Bilateral pleural effusion 07/21/2014  . Cellulitis 09/19/2016  . Chronic anemia   . Chronic anticoagulation 01/14/2015  . Chronic diastolic heart failure ([place]) 01/14/2015  . Chronic gout of multiple sites 12/25/2015  . Coronary artery calcification seen on CT scan 01/14/2015   Overview:  Dense calcifications on CT and normal coranaries on catheterization Jan 2016  . DDD (degenerative disc disease), lumbosacral 12/25/2015  . Demand ischemia ([place]) 07/20/2014  . False positive serological test for hepatitis C 01/16/2017  . Folic acid deficiency 2/50/0370  . Glaucoma   . Glaucoma 12/25/2015  . Gout   . Hematuria 01/16/2016   Overview:  [place]  . High risk medication use 12/25/2015  . HTN (hypertension)  07/20/2014  . Hypertension   . Kidney cyst, acquired 01/16/2017   Overview:  Complex. Felt benign by [place]  . Kidney stone 12/25/2015  . Kidney stones    "multiple"  . Malaise and fatigue 12/25/2015  . Mild CAD 07/21/2014  . Mixed hyperlipidemia 12/25/2015  . Morbid obesity ([place]) 08/29/2010   pt is 500 lbs  . Morbid obesity with BMI of 60.0-69.9, adult ([place]) 12/25/2015  . New onset atrial fibrillation ([place]) 07/19/2014   w/RVR  . OSA on CPAP   . Osteoarthritis of multiple joints 12/25/2015  . PAF (paroxysmal atrial fibrillation) ([place]) 01/14/2015  . Prediabetes 12/25/2015  . Pulmonary emboli ([place]) 07/21/2014  . UTI (urinary tract infection) 07/20/2014   Past Surgical History:  Procedure Laterality Date  . CARDIAC CATHETERIZATION  03/2013   "no blockages"  . CYSTOSCOPY W/ STONE MANIPULATION  ~ 2010  . LAPAROSCOPIC GASTRIC SLEEVE RESECTION  08/2013   "gastric sleeve"  . LEFT HEART CATHETERIZATION WITH CORONARY ANGIOGRAM N/A 07/20/2014   Procedure: LEFT HEART CATHETERIZATION WITH CORONARY ANGIOGRAM;  Surgeon: Wellington Hampshire, MD;  Location: [facility];  Service: Cardiovascular;  Laterality: N/A;  . TONSILLECTOMY  05/1965    Current Outpatient Medications:  .  acetaminophen (TYLENOL) 500 MG tablet, Take 1,000 mg by mouth every 6 (six) hours as needed (pain)., Disp: , Rfl:  .  allopurinol (ZYLOPRIM) 100 MG tablet, Take 200 mg by mouth every morning. , Disp: , Rfl:  .  diltiazem (CARDIZEM CD) 120 MG 24 hr capsule, Take 120 mg by mouth daily., Disp: , Rfl:  .  furosemide (LASIX) 20 MG tablet, Take  1 tablet (20 mg total) by mouth daily., Disp: 30 tablet, Rfl: 11 .  HYDROcodone-acetaminophen (HYCET) 7.5-325 mg/15 ml solution, hydrocodone 7.5 mg-acetaminophen 325 mg/15 mL oral solution, Disp: , Rfl:  .  latanoprost (XALATAN) 0.005 % ophthalmic solution, Place 1 drop into both eyes nightly., Disp: , Rfl:  .  losartan (COZAAR) 50 MG tablet, Take 50 mg by mouth daily., Disp: , Rfl: 3 .  metoprolol  tartrate (LOPRESSOR) 25 MG tablet, Take 1 tablet (25 mg total) by mouth 2 (two) times daily., Disp: 60 tablet, Rfl: 11 .  Multiple Vitamin (MULTI-VITAMIN DAILY) TABS, Take 1 tablet by mouth daily., Disp: , Rfl:  .  mupirocin ointment (BACTROBAN) 2 %, Apply 1 application topically every evening. Apply to stomach, Disp: , Rfl:  .  MYRBETRIQ 50 MG TB24 tablet, Take 50 mg by mouth daily., Disp: , Rfl:  .  rivaroxaban (XARELTO) 20 MG TABS tablet, Take 20 mg by mouth daily., Disp: , Rfl:  .  rosuvastatin (CRESTOR) 10 MG tablet, TAKE 1 TABLET BY MOUTH ONCE DAILY, Disp: , Rfl:  .  tamsulosin (FLOMAX) 0.4 MG CAPS capsule, Take 0.4 mg by mouth daily., Disp: , Rfl:  No Known Allergies Social History   Occupational History  . Not on file  Tobacco Use  . Smoking status: Never Smoker  . Smokeless tobacco: Never Used  Vaping Use  . Vaping Use: Never used  Substance and Sexual Activity  . Alcohol use: Yes    Comment: 07/20/2014 "might drink q 2-3 years"  . Drug use: Yes    Types: Marijuana    Comment: "in my college years"  . Sexual activity: Not Currently    Objective:   Constitutional Ja Ohman. is a pleasant 59 y.o. African American male, morbidly obese in NAD.Marland Kitchen AAO x 3.   Vascular Capillary fill time to digits <3 seconds b/l lower extremities. Nonpalpable DP pulse(s) b/l lower extremities. Nonpalpable PT pulse(s) b/l lower extremities. Pedal hair absent. Lower extremity skin temperature gradient within normal limits. Lymphedema present b/l lower extremities. No ischemia or gangrene noted b/l lower extremities. No cyanosis or clubbing noted.  Neurologic Normal speech. Oriented to person, place, and time. Epicritic sensation to light touch grossly present bilaterally. Protective sensation intact 5/5 intact bilaterally with 10g monofilament b/l. Vibratory sensation intact b/l.  Dermatologic Pedal skin with normal turgor, texture and tone bilaterally. No open wounds bilaterally. No interdigital  macerations bilaterally. Toenails 1-5 b/l elongated, discolored, dystrophic, thickened, crumbly with subungual debris and tenderness to dorsal palpation. Hyperkeratotic lesion(s) L 5th toe, submet head 5 left foot and submet head 5 right foot.  No erythema, no edema, no drainage, no flocculence.  Orthopedic: Normal muscle strength 5/5 to all lower extremity muscle groups bilaterally. No pain crepitus or joint limitation noted with ROM b/l. Pes planus deformity noted b/l.    Radiographs: None Assessment:   1. Pain due to onychomycosis of toenails of both feet   2. Corns and callosities   3. Lymphedema   4. PVD (peripheral vascular disease) ([place])    Plan:  Patient was evaluated and treated and all questions answered.  Onychomycosis with pain -Nails palliatively debridement as below -Educated on self-care  Procedure: Nail Debridement Rationale: Pain Type of Debridement: manual, sharp debridement. Instrumentation: Nail nipper, rotary burr. Number of Nails: 10  -Examined patient. -Toenails 1-5 b/l were debrided in length and girth with sterile nail nippers and dremel without iatrogenic bleeding.  -Corn(s) L 5th toe and callus(es) submet head 5  left foot and submet head 5 right foot were not trimmed per patient request. -Patient to report any pedal injuries to medical professional immediately. -Patient to continue soft, supportive shoe gear daily. -Patient/POA to call should there be question/concern in the interim.  Return in about 3 months (around 09/20/2020) for painful mycotic toenails.  Marzetta Board, DPM

## 2020-09-21 ENCOUNTER — Other Ambulatory Visit: Payer: Self-pay

## 2020-09-21 ENCOUNTER — Telehealth: Payer: Self-pay | Admitting: *Deleted

## 2020-09-21 ENCOUNTER — Encounter: Payer: Self-pay | Admitting: Podiatry

## 2020-09-21 ENCOUNTER — Ambulatory Visit: Payer: BC Managed Care – PPO | Admitting: Podiatry

## 2020-09-21 DIAGNOSIS — M79674 Pain in right toe(s): Secondary | ICD-10-CM

## 2020-09-21 DIAGNOSIS — B351 Tinea unguium: Secondary | ICD-10-CM

## 2020-09-21 DIAGNOSIS — I739 Peripheral vascular disease, unspecified: Secondary | ICD-10-CM

## 2020-09-21 DIAGNOSIS — I890 Lymphedema, not elsewhere classified: Secondary | ICD-10-CM

## 2020-09-21 DIAGNOSIS — M79675 Pain in left toe(s): Secondary | ICD-10-CM | POA: Diagnosis not present

## 2020-09-21 DIAGNOSIS — L859 Epidermal thickening, unspecified: Secondary | ICD-10-CM

## 2020-09-21 MED ORDER — UREA 41 % EX CREA: TOPICAL_CREAM | CUTANEOUS | 2 refills | Status: AC

## 2020-09-21 NOTE — Progress Notes (Signed)
  Subjective:  Patient ID: Colin Hart[facility]., male    DOB: [birth year],  MRN: [id number]  Colin Hart[facility]. presents to clinic today for for at risk foot care. Patient has h/o PAD and painful corn(s) left 5th digit , callus(es) b/l feet and painful mycotic nails.  Pain interferes with ambulation. Aggravating factors include wearing enclosed shoe gear. Painful toenails interfere with ambulation. Aggravating factors include wearing enclosed shoe gear. Pain is relieved with periodic professional debridement. Painful corns and calluses are aggravated when weightbearing with and without shoegear. Pain is relieved with periodic professional debridement..  60 y.o. male presents with the above complaint.  PCP is Dr. [name]. Last visit was 06/26/2020.  Review of Systems: Negative except as noted in the HPI  .No Known Allergies    Objective:   Constitutional Colin Hall. is a pleasant 60 y.o. African American male, morbidly obese in NAD.Marland Kitchen AAO x 3.   Vascular Capillary fill time to digits <3 seconds b/l lower extremities. Nonpalpable DP pulse(s) b/l lower extremities. Nonpalpable PT pulse(s) b/l lower extremities. Pedal hair absent. Lower extremity skin temperature gradient within normal limits. Lymphedema present b/l lower extremities. No ischemia or gangrene noted b/l lower extremities. No cyanosis or clubbing noted.  Neurologic Normal speech. Oriented to person, place, and time. Epicritic sensation to light touch grossly present bilaterally. Protective sensation intact 5/5 intact bilaterally with 10g monofilament b/l. Vibratory sensation intact b/l.  Dermatologic Pedal skin with normal turgor, texture and tone bilaterally. No open wounds bilaterally. No interdigital macerations bilaterally. Toenails 1-5 b/l elongated, discolored, dystrophic, thickened, crumbly with subungual debris and tenderness to dorsal palpation. Hyperkeratotic lesion(s) L 5th toe, submet head 5 left foot and submet head 5 right  foot.  No erythema, no edema, no drainage, no flocculence.  Orthopedic: Normal muscle strength 5/5 to all lower extremity muscle groups bilaterally. No pain crepitus or joint limitation noted with ROM b/l. Pes planus deformity noted b/l.    Radiographs: None Assessment:   1. Pain due to onychomycosis of toenails of both feet   2. Hyperkeratosis   3. Lymphedema   4. PVD (peripheral vascular disease) ([place])    Plan:  Patient was evaluated and treated and all questions answered.  Onychomycosis with pain -Nails palliatively debridement as below -Educated on self-care  Procedure: Nail Debridement Rationale: Pain Type of Debridement: manual, sharp debridement. Instrumentation: Nail nipper, rotary burr. Number of Nails: 10  -Examined patient. -Discussed with patient he should qualify for callus paring and I will submit letter to insurance company for pre-authorization. We will let him know outcome of request. If approved, we will call him to come in and have his painful callus pared. -Toenails 1-5 b/l were debrided in length and girth with sterile nail nippers and dremel without iatrogenic bleeding.  -Rx for Urea 41% cream to be applied to hyperkeratosis once daily. Discussed use of bariatric chair in the shower and long handled pumice stone to file lesion. -Patient to report any pedal injuries to medical professional immediately. -Patient to continue soft, supportive shoe gear daily. -Patient/POA to call should there be question/concern in the interim.  Return in about 3 months (around 12/22/2020).  Marzetta Board, DPM

## 2020-09-21 NOTE — Patient Instructions (Signed)
Urea skin cream, gel, lotion, ointment, or nail lacquer What is this medicine? UREA (yoo REE uh) is used to soften thick, rough, or dry skin caused by certain skin conditions. It is also used to soften and remove damaged or diseased nails without surgery. This medicine may be used for other purposes; ask your health care provider or pharmacist if you have questions. COMMON BRAND NAME(S): Aluvea, BP-50% Urea, BP-K50, Carmol, CEM-Urea, Cerovel, DERMASORB XM Complete, Epimide-50, Gord Urea, Gordons Urea, Hydro 35, [place] 40, [place], [place] 42, Keralac, Keralac [place], [place], Alta Plus, [place], Donna AD, [place], Latrix, Optima, Nutraplus, RE Urea 40, RE Urea 50, RE-U40, [place], [place], [place], U-Kera, U40, Ultra Mide 25, Ultralytic-2, [place], [place] [employer], [place], [place], Ax[place] GT, [place], Silvio [place], [place], [place], [place], [place], [place], U[place], [place], [place] What should I tell my health care provider before I take this medicine? They need to know if you have any of these conditions:  broken, inflamed, or burnt skin  infection  an unusual or allergic reaction to urea, other medicines, foods, dyes, or preservatives  pregnant or trying to get pregnant  breast-feeding How should I use this medicine? This medicine is for external use only. Do not take by mouth. Follow the directions on the label. Apply a thin film to the affected area. The moisturizing effect may be better if this medicine is applied while the skin is still damp after washing or bathing. If applying to the nails, cover to protect the surrounding area. Apply generously to the affected nail. Let it dry uncovered or cover with an adhesive bandage or gauze secured with tape. The treated nail can be removed after several days. On exposure to air the nail bed hardens within 12 to 36 hours. Apply with caution to the face or on broken skin. Do not get this medicine in or near the eyes, lips or other areas of  sensitive skin. Talk to your pediatrician regarding the use of this medicine in children. Special care may be needed. Overdosage: If you think you have taken too much of this medicine contact a poison control center or emergency room at once. NOTE: This medicine is only for you. Do not share this medicine with others. What if I miss a dose? If you miss a dose, use it as soon as you can. If it is almost time for your next dose, use only that dose. Do not use double or extra doses. What may interact with this medicine? Interactions are not expected. Do not use any other skin products on the affected area without telling your doctor or health care professional. This list may not describe all possible interactions. Give your health care provider a list of all the medicines, herbs, non-prescription drugs, or dietary supplements you use. Also tell them if you smoke, drink alcohol, or use illegal drugs. Some items may interact with your medicine. What should I watch for while using this medicine? Tell your doctor or health care professional if your symptoms do not improve. What side effects may I notice from receiving this medicine? Side effects that you should report to your doctor or health care professional as soon as possible:  redness or irritation that does not go away Side effects that usually do not require medical attention (report to your doctor or health care professional if they continue or are bothersome):  skin rash  stinging, or irritation This list may not describe all possible side effects. Call your doctor for medical advice about side  effects. You may report side effects to FDA at 1-800-FDA-1088. Where should I keep my medicine? Keep out of the reach of children. Store at room temperature between 15 and 30 degrees C (59 and 86 degrees F). Keep in a well closed container. Throw away any unused medicine after the expiration date. NOTE: This sheet is a summary. It may not cover all  possible information. If you have questions about this medicine, talk to your doctor, pharmacist, or health care provider.  2021 Elsevier/Gold Standard (2008-03-14 16:44:58)

## 2020-09-21 NOTE — Telephone Encounter (Signed)
Called and left a message for the patient and relayed the message per Dr Elisha Ponder. Lattie Haw

## 2020-11-07 DIAGNOSIS — M1990 Unspecified osteoarthritis, unspecified site: Secondary | ICD-10-CM | POA: Insufficient documentation

## 2020-11-07 DIAGNOSIS — D649 Anemia, unspecified: Secondary | ICD-10-CM | POA: Insufficient documentation

## 2020-11-07 DIAGNOSIS — N200 Calculus of kidney: Secondary | ICD-10-CM | POA: Insufficient documentation

## 2020-11-07 DIAGNOSIS — I10 Essential (primary) hypertension: Secondary | ICD-10-CM | POA: Insufficient documentation

## 2020-11-28 NOTE — Progress Notes (Signed)
Cardiology Office Note:    Date:  11/29/2020   ID:  Colin Hartmann., DOB [birth year], MRN [id number]  PCP:  Raina Mina., MD  Cardiologist:  Shirlee More, MD    Referring MD: Raina Mina., MD    ASSESSMENT:    1. Hypertensive heart failure ([place])   2. Chronic diastolic heart failure ([place])   3. PAF (paroxysmal atrial fibrillation) (H[facility])   4. Other pulmonary embolism without acute cor pulmonale, unspecified chronicity ([place])   5. Chronic anticoagulation    PLAN:    In order of problems listed above:  1. Stable repeat blood pressure 128/70 large cuff he will continue his current medical regimen loop diuretic ARB and Cardizem 2. Maintaining sinus rhythm not on antiarrhythmic drug continue long-term anticoagulation with history of pulmonary embolism 3. Heart failure compensated continue his current loop diuretic   Next appointment: 1 year   Medication Adjustments/Labs and Tests Ordered: Current medicines are reviewed at length with the patient today.  Concerns regarding medicines are outlined above.  Orders Placed This Encounter  Procedures  . EKG 12-Lead   No orders of the defined types were placed in this encounter.   Chief Complaint  Patient presents with  . Follow-up  . Congestive Heart Failure  . Atrial Fibrillation  . Anticoagulation    History of Present Illness:    Colin Helser. is a 60 y.o. male with a hx of chronic diastolic heart failure hypertension paroxysmal atrial fibrillation and unprovoked pulmonary embolism on chronic anticoagulation last seen 06/03/2018.  Compliance with diet, lifestyle and medications: Yes  He is seen after long hiatus during COVID-19 He has not been back in the hospital and says that overall he is done well. He has chronic edema unchanged compliant with his diuretic finds himself short of breath with activities he attributes to his body mass but has had no change in symptoms. He has had no chest pain palpitation or  syncope maintain sinus rhythm. He continues with his diuretic without bleeding complication  Recent labs 06/26/2020: Cholesterol 133 LDL 55 triglycerides 54 HDL 62 non-HDL cholesterol 171 Sodium 140 potassium 4.0 creatinine 0.93 GFR greater than 90 cc normal liver function test Hemoglobin 11.5 platelets 204,000  Echocardiogram 2016 showed EF of 55% mild concentric LVH elevated left atrial pressure mild left atrial enlargement and right atrial enlargement mild elevation of pulmonary artery systolic pressure. Past Medical History:  Diagnosis Date  . Acute diastolic CHF (congestive heart failure) ([place]) 07/21/2014  . Anemia, chronic disease 12/25/2015  . Arthritis    "back" (07/20/2014)  . Atrial fibrillation with rapid ventricular response ([place]) 07/20/2014  . Bilateral pleural effusion 07/21/2014  . Cellulitis 09/19/2016  . Chronic anemia   . Chronic anticoagulation 01/14/2015  . Chronic diastolic heart failure ([place]) 01/14/2015  . Chronic gout of multiple sites 12/25/2015  . Coronary artery calcification seen on CT scan 01/14/2015   Overview:  Dense calcifications on CT and normal coranaries on catheterization Jan 2016  . DDD (degenerative disc disease), lumbosacral 12/25/2015  . Demand ischemia ([place]) 07/20/2014  . False positive serological test for hepatitis C 01/16/2017  . Folic acid deficiency [id number]  . Glaucoma   . Glaucoma 12/25/2015  . Gout   . Hematuria 01/16/2016   Overview:  [place]  . High risk medication use 12/25/2015  . HTN (hypertension) 07/20/2014  . Hypertension   . Kidney cyst, acquired 01/16/2017   Overview:  Complex. Felt benign by [place]  . Kidney stone 12/25/2015  .  Kidney stones    "multiple"  . Malaise and fatigue 12/25/2015  . Mild CAD 07/21/2014  . Mixed hyperlipidemia 12/25/2015  . Morbid obesity ([place]) 08/29/2010   pt is 500 lbs  . Morbid obesity with BMI of 60.0-69.9, adult ([place]) 12/25/2015  . New onset atrial fibrillation ([place]) 07/19/2014   w/RVR  . OSA on CPAP    . Osteoarthritis of multiple joints 12/25/2015  . PAF (paroxysmal atrial fibrillation) ([place]) 01/14/2015  . Prediabetes 12/25/2015  . Pulmonary emboli ([place]) 07/21/2014  . UTI (urinary tract infection) 07/20/2014    Past Surgical History:  Procedure Laterality Date  . CARDIAC CATHETERIZATION  03/2013   "no blockages"  . CYSTOSCOPY W/ STONE MANIPULATION  ~ 2010  . LAPAROSCOPIC GASTRIC SLEEVE RESECTION  08/2013   "gastric sleeve"  . LEFT HEART CATHETERIZATION WITH CORONARY ANGIOGRAM N/A 07/20/2014   Procedure: LEFT HEART CATHETERIZATION WITH CORONARY ANGIOGRAM;  Surgeon: Wellington Hampshire, MD;  Location: [facility]ATH [facility];  Service: Cardiovascular;  Laterality: N/A;  . TONSILLECTOMY  05/1965    Current Medications: Current Meds  Medication Sig  . acetaminophen (TYLENOL) 500 MG tablet Take 1,000 mg by mouth every 6 (six) hours as needed (pain).  Marland Kitchen allopurinol (ZYLOPRIM) 100 MG tablet Take 200 mg by mouth every morning.   . diltiazem (CARDIZEM CD) 120 MG 24 hr capsule Take 120 mg by mouth daily.  . furosemide (LASIX) 20 MG tablet Take 1 tablet (20 mg total) by mouth daily.  Marland Kitchen HYDROcodone-acetaminophen (HYCET) 7.5-325 mg/15 ml solution hydrocodone 7.5 mg-acetaminophen 325 mg/15 mL oral solution  . latanoprost (XALATAN) 0.005 % ophthalmic solution Place 1 drop into both eyes nightly.  Marland Kitchen losartan (COZAAR) 50 MG tablet Take 50 mg by mouth daily.  . metoprolol tartrate (LOPRESSOR) 25 MG tablet Take 1 tablet (25 mg total) by mouth 2 (two) times daily.  . Multiple Vitamin (MULTI-VITAMIN DAILY) TABS Take 1 tablet by mouth daily.  . mupirocin ointment (BACTROBAN) 2 % Apply 1 application topically every evening. Apply to stomach  . rivaroxaban (XARELTO) 20 MG TABS tablet Take 20 mg by mouth daily.  . rosuvastatin (CRESTOR) 10 MG tablet TAKE 1 TABLET BY MOUTH ONCE DAILY  . tamsulosin (FLOMAX) 0.4 MG CAPS capsule Take 0.4 mg by mouth daily.  . Trospium Chloride 60 MG CP24 Take 1 capsule by mouth daily.  . Urea  41 % CREA Apply to calloused skin once daily.  . vitamin B-12 (CYANOCOBALAMIN) 1000 MCG tablet Take 1,000 mcg by mouth daily.     Allergies:   Patient has no known allergies.   Social History   Socioeconomic History  . Marital status: Single    Spouse name: Not on file  . Number of children: Not on file  . Years of education: Not on file  . Highest education level: Not on file  Occupational History  . Not on file  Tobacco Use  . Smoking status: Never Smoker  . Smokeless tobacco: Never Used  Vaping Use  . Vaping Use: Never used  Substance and Sexual Activity  . Alcohol use: Yes    Comment: 07/20/2014 "might drink q 2-3 years"  . Drug use: Yes    Types: Marijuana    Comment: "in my college years"  . Sexual activity: Not Currently  Other Topics Concern  . Not on file  Social History Narrative  . Not on file   Social Determinants of Health   Financial Resource Strain: Not on file  Food Insecurity: Not on file  Transportation Needs: Not on file  Physical Activity: Not on file  Stress: Not on file  Social Connections: Not on file     Family History: The patient's family history includes Dementia in his mother; Diabetes in his father; Heart attack in his daughter. ROS:   Please see the history of present illness.    All other systems reviewed and are negative.  EKGs/Labs/Other Studies Reviewed:    The following studies were reviewed today:  EKG:  EKG ordered today and personally reviewed.  The ekg ordered today demonstrates sinus rhythm left axis deviation otherwise normal EKG   Recent Lipid Panel    Component Value Date/Time   CHOL 114 07/22/2014 0330   TRIG 43 07/22/2014 0330   HDL 30 (L) 07/22/2014 0330   CHOLHDL 3.8 07/22/2014 0330   VLDL 9 07/22/2014 0330   LDLCALC 75 07/22/2014 0330    Physical Exam:    VS:  BP (!) 145/89 (BP Location: Left Wrist, Patient Position: Sitting)   Pulse 66   Ht '5\' 9"'$  (1.753 m)   Wt (!) 515 lb 6.4 oz (233.8 kg)   SpO2  96%   BMI 76.11 kg/m     Wt Readings from Last 3 Encounters:  11/29/20 (!) 515 lb 6.4 oz (233.8 kg)  06/03/18 (!) 513 lb 6.4 oz (232.9 kg)  02/06/17 (!) 476 lb (215.9 kg)     GEN: Morbidly obese BMI exceeds 70 well nourished, well developed in no acute distress HEENT: Normal NECK: No JVD; No carotid bruits LYMPHATICS: No lymphadenopathy CARDIAC: RRR, no murmurs, rubs, gallops RESPIRATORY:  Clear to auscultation without rales, wheezing or rhonchi  ABDOMEN: Soft, non-tender, non-distended MUSCULOSKELETAL:  No edema; No deformity  SKIN: Warm and dry NEUROLOGIC:  Alert and oriented x 3 PSYCHIATRIC:  Normal affect    Signed, Shirlee More, MD  11/29/2020 3:35 PM    [facility]

## 2020-11-29 ENCOUNTER — Other Ambulatory Visit: Payer: Self-pay

## 2020-11-29 ENCOUNTER — Ambulatory Visit (INDEPENDENT_AMBULATORY_CARE_PROVIDER_SITE_OTHER): Payer: BC Managed Care – PPO | Admitting: Cardiology

## 2020-11-29 VITALS — BP 145/89 | HR 66 | Ht 69.0 in | Wt >= 6400 oz

## 2020-11-29 DIAGNOSIS — I5032 Chronic diastolic (congestive) heart failure: Secondary | ICD-10-CM

## 2020-11-29 DIAGNOSIS — I2699 Other pulmonary embolism without acute cor pulmonale: Secondary | ICD-10-CM

## 2020-11-29 DIAGNOSIS — I110 Hypertensive heart disease with heart failure: Secondary | ICD-10-CM | POA: Diagnosis not present

## 2020-11-29 DIAGNOSIS — I480 Paroxysmal atrial fibrillation: Secondary | ICD-10-CM | POA: Diagnosis not present

## 2020-11-29 DIAGNOSIS — Z7901 Long term (current) use of anticoagulants: Secondary | ICD-10-CM

## 2020-11-29 NOTE — Patient Instructions (Signed)

## 2021-01-25 ENCOUNTER — Ambulatory Visit: Payer: BC Managed Care – PPO | Admitting: Podiatry

## 2021-01-25 ENCOUNTER — Encounter: Payer: Self-pay | Admitting: Podiatry

## 2021-01-25 DIAGNOSIS — B351 Tinea unguium: Secondary | ICD-10-CM | POA: Diagnosis not present

## 2021-01-25 DIAGNOSIS — M79674 Pain in right toe(s): Secondary | ICD-10-CM | POA: Diagnosis not present

## 2021-01-25 DIAGNOSIS — M79675 Pain in left toe(s): Secondary | ICD-10-CM

## 2021-01-28 NOTE — Progress Notes (Signed)
Subjective: Colin Hall. is a pleasant 59 y.o. male patient seen today for at risk foot care for painful thick toenails that are difficult to trim. Pain interferes with ambulation. Aggravating factors include wearing enclosed shoe gear. Pain is relieved with periodic professional debridement.  He has been keeping calluses filed down and applying moisturizer daily.   PCP is Raina Mina., MD. Last visit was: 12/25/2020.  No Known Allergies  Objective: Physical Exam  General: Colin Hall. is a pleasant 60 y.o. African American male, morbidly obese in NAD. AAO x 3.   Vascular:  Capillary fill time to digits <3 seconds b/l lower extremities. Nonpalpable pedal pulse(s) b/l lower extremities. Pedal hair absent. Lower extremity skin temperature gradient within normal limits. Lymphedema present b/l lower extremities.  Dermatological:  Pedal skin with normal turgor, texture and tone b/l lower extremities. No open wounds b/l lower extremities. No interdigital macerations b/l lower extremities. Toenails 1-5 b/l elongated, discolored, dystrophic, thickened, crumbly with subungual debris and tenderness to dorsal palpation. Hyperkeratotic lesion(s) L 5th toe, submet head 5 left foot, and submet head 5 right foot.  No erythema, no edema, no drainage, no fluctuance.  Musculoskeletal:  Normal muscle strength 5/5 to all lower extremity muscle groups bilaterally. Pes planus deformity noted b/l lower extremities.  Neurological:  Protective sensation intact 5/5 intact bilaterally with 10g monofilament b/l.  Assessment and Plan:  1. Pain due to onychomycosis of toenails of both feet      -Examined patient. -Check status of pre-authorization with billing department. -Patient to continue soft, supportive shoe gear daily. -Toenails 1-5 b/l were debrided in length and girth with sterile nail nippers and dremel without iatrogenic bleeding.  -Patient to report any pedal injuries to medical  professional immediately. -Patient/POA to call should there be question/concern in the interim.  Return in about 3 months (around 04/27/2021).  Marzetta Board, DPM

## 2021-04-26 ENCOUNTER — Ambulatory Visit: Payer: BC Managed Care – PPO | Admitting: Podiatry

## 2021-05-14 ENCOUNTER — Other Ambulatory Visit: Payer: Self-pay

## 2021-05-14 ENCOUNTER — Ambulatory Visit: Payer: BC Managed Care – PPO | Admitting: Podiatry

## 2021-05-14 DIAGNOSIS — L84 Corns and callosities: Secondary | ICD-10-CM

## 2021-05-14 DIAGNOSIS — M79674 Pain in right toe(s): Secondary | ICD-10-CM

## 2021-05-14 DIAGNOSIS — B351 Tinea unguium: Secondary | ICD-10-CM

## 2021-05-14 DIAGNOSIS — M79675 Pain in left toe(s): Secondary | ICD-10-CM | POA: Diagnosis not present

## 2021-05-14 DIAGNOSIS — I739 Peripheral vascular disease, unspecified: Secondary | ICD-10-CM

## 2021-05-15 ENCOUNTER — Encounter: Payer: Self-pay | Admitting: Podiatry

## 2021-05-15 NOTE — Progress Notes (Signed)
Subjective: Colin Moten. is a pleasant 60 y.o. male patient seen today for at risk foot care. Patient has h/o PAD and painful thick toenails that are difficult to trim. Pain interferes with ambulation. Aggravating factors include wearing enclosed shoe gear. Pain is relieved with periodic professional debridement.   He states he would like to have calluses pared on today's visit.  He states he lost his [place] in September.  PCP is Raina Mina., MD. Last visit was: 12/25/2020.  No Known Allergies  Objective: Physical Exam  General: Colin Hall. is a very pleasant 60 y.o. African American male, morbidly obese in NAD. AAO x 3.   Vascular:  CFT <3 seconds b/l LE. Nonpalpable DP pulse(s) b/l LE. Nonpalpable PT pulse(s) b/l LE. Pedal hair absent. No pain with calf compression RLE. Lymphedema present BLE.   Dermatological:  Pedal skin with normal turgor, texture and tone b/l lower extremities. No open wounds b/l LE. No interdigital macerations noted b/l LE. Toenails 1-5 b/l elongated, discolored, dystrophic, thickened, crumbly with subungual debris and tenderness to dorsal palpation. Hyperkeratotic lesion(s) L 5th toe, submet head 5 b/l, and plantarlateral aspect of midfoot left foot.  No erythema, no edema, no drainage, no fluctuance.   Musculoskeletal:  Normal muscle strength 5/5 to all lower extremity muscle groups bilaterally. Pes planus deformity noted bilateral LE.   Neurological:  Protective sensation intact 5/5 intact bilaterally with 10g monofilament b/l.   Assessment and Plan:  1. Pain due to onychomycosis of toenails of both feet   2. Corns and callosities   3. PVD (peripheral vascular disease) ([place])     Patient was evaluated and treated and all questions answered. Consent given for treatment as described below: -Examined patient. -Mycotic toenails 1-5 bilaterally were debrided in length and girth with sterile nail nippers and dremel without  incident. -Callus(es) L 5th toe, submet head 5 b/l, and plantarlateral aspect of midfoot left foot pared utilizing sterile scalpel blade. Total number debrided =4. Pinpoint bleeding plantarlateral aspect of left midfoot addressed with Lumicain Hemostatic Solution. Triple antibiotic ointment and band-aid applied. He is to apply Neosporin to area once daily for one week. Call office if he has any problems. -Patient/POA to call should there be question/concern in the interim.  Return in about 3 months (around 08/14/2021).  Marzetta Board, DPM

## 2021-08-02 ENCOUNTER — Ambulatory Visit: Payer: BC Managed Care – PPO | Admitting: Podiatry

## 2021-09-06 ENCOUNTER — Ambulatory Visit (INDEPENDENT_AMBULATORY_CARE_PROVIDER_SITE_OTHER): Payer: BC Managed Care – PPO | Admitting: Podiatry

## 2021-09-06 ENCOUNTER — Other Ambulatory Visit: Payer: Self-pay

## 2021-09-06 ENCOUNTER — Encounter: Payer: Self-pay | Admitting: Podiatry

## 2021-09-06 DIAGNOSIS — B351 Tinea unguium: Secondary | ICD-10-CM | POA: Diagnosis not present

## 2021-09-06 DIAGNOSIS — M79674 Pain in right toe(s): Secondary | ICD-10-CM

## 2021-09-06 DIAGNOSIS — I739 Peripheral vascular disease, unspecified: Secondary | ICD-10-CM | POA: Diagnosis not present

## 2021-09-06 DIAGNOSIS — L84 Corns and callosities: Secondary | ICD-10-CM

## 2021-09-06 DIAGNOSIS — M79675 Pain in left toe(s): Secondary | ICD-10-CM | POA: Diagnosis not present

## 2021-09-06 NOTE — Progress Notes (Signed)
?  Subjective:  ?Patient ID: Colin Hall., male    DOB: 1960-08-23,  MRN: [id number] ? ?Colin Hall. presents to clinic today for for at risk foot care. Patient has h/o PAD and callus(es) both feet and painful thick toenails that are difficult to trim. Painful toenails interfere with ambulation. Aggravating factors include wearing enclosed shoe gear. Pain is relieved with periodic professional debridement. Painful calluses are aggravated when weightbearing with and without shoegear. Pain is relieved with periodic professional debridement. ? ?He states he has not been using the Urea Cream on his heels as much as he should, but will restart. ? ?Patient is  on blood thinner, Xarelto. ? ?New problem(s): None.  ? ?PCP is Raina Mina., MD , and last visit was July 17, 2021. ? ?No Known Allergies ? ?Review of Systems: Negative except as noted in the HPI. ?Objective:  ? ?Constitutional Dutch Ing. is a pleasant 61 y.o. African American male, morbidly obese in NAD. AAO x 3.   ?Vascular CFT <3 seconds b/l LE. Diminished DP/PT pulses b/l LE. Pedal hair absent. No pain with calf compression b/l. Evidence of chronic venous insufficiency b/l LE. Lymphedema present BLE. No ischemia or gangrene noted b/l LE. No cyanosis or clubbing noted b/l LE.  ?Neurologic Normal speech. Oriented to person, place, and time. Protective sensation intact 5/5 intact bilaterally with 10g monofilament b/l. Vibratory sensation diminished b/l.  ?Dermatologic No open wounds b/l LE. No interdigital macerations noted b/l LE. Toenails 1-5 b/l elongated, discolored, dystrophic, thickened, crumbly with subungual debris and tenderness to dorsal palpation. Hyperkeratotic lesion(s) b/l lower extremities.  No erythema, no edema, no drainage, no fluctuance. Skin b/l lower extremities noted to be thickened and brawny consistent with lymphedema.  ?Orthopedic: Muscle strength 5/5 to all lower extremity muscle groups bilaterally. Pes planus deformity  noted bilateral LE. Utilizes cane for ambulation assistance.  ? ?Radiographs: None ? ?Last A1c: No flowsheet data found. ?  ?Assessment:  ? ?1. Pain due to onychomycosis of toenails of both feet   ?2. Callus   ?3. PVD (peripheral vascular disease) ([place])   ? ?Plan:  ?Patient was evaluated and treated and all questions answered. ?Consent given for treatment as described below: ?-Toenails 1-5 b/l were debrided in length and girth with sterile nail nippers and dremel without iatrogenic bleeding.  ?-Callus(es) submet head 5 left foot pared utilizing sterile scalpel blade without complication or incident. Total number debrided =1. ?-Patient/POA to call should there be question/concern in the interim. ? ?Return in about 3 months (around 12/07/2021). ? ?Marzetta Board, DPM ?

## 2021-12-20 ENCOUNTER — Ambulatory Visit (INDEPENDENT_AMBULATORY_CARE_PROVIDER_SITE_OTHER): Payer: BC Managed Care – PPO | Admitting: Podiatry

## 2021-12-20 ENCOUNTER — Encounter: Payer: Self-pay | Admitting: Podiatry

## 2021-12-20 DIAGNOSIS — L84 Corns and callosities: Secondary | ICD-10-CM

## 2021-12-20 DIAGNOSIS — Z992 Dependence on renal dialysis: Secondary | ICD-10-CM | POA: Insufficient documentation

## 2021-12-20 DIAGNOSIS — M79674 Pain in right toe(s): Secondary | ICD-10-CM

## 2021-12-20 DIAGNOSIS — I739 Peripheral vascular disease, unspecified: Secondary | ICD-10-CM | POA: Diagnosis not present

## 2021-12-20 DIAGNOSIS — B351 Tinea unguium: Secondary | ICD-10-CM

## 2021-12-20 DIAGNOSIS — M79675 Pain in left toe(s): Secondary | ICD-10-CM

## 2021-12-28 NOTE — Progress Notes (Signed)
  Subjective:  Patient ID: Colin Hall., male    DOB: 23-Sep-1960,  MRN: [id number]  Colin Hall. presents to clinic today for for at risk foot care. Patient has h/o PAD and callus(es) left lower extremity and painful thick toenails that are difficult to trim. Painful toenails interfere with ambulation. Aggravating factors include wearing enclosed shoe gear. Pain is relieved with periodic professional debridement. Painful calluses are aggravated when weightbearing with and without shoegear. Pain is relieved with periodic professional debridement.  New problem(s): None.   PCP is Raina Mina., MD , and last visit was July 17, 2021.  No Known Allergies  Review of Systems: Negative except as noted in the HPI.  Objective: No changes noted in today's physical examination. Constitutional Onnie Hatchel. is a pleasant 61 y.o. African American male, morbidly obese in NAD. AAO x 3.   Vascular CFT <3 seconds b/l LE. Diminished DP/PT pulses b/l LE. Pedal hair absent. No pain with calf compression b/l. Evidence of chronic venous insufficiency b/l LE. Lymphedema present BLE. No ischemia or gangrene noted b/l LE. No cyanosis or clubbing noted b/l LE.  Neurologic Normal speech. Oriented to person, place, and time. Protective sensation intact 5/5 intact bilaterally with 10g monofilament b/l. Vibratory sensation diminished b/l.  Dermatologic No open wounds b/l LE. No interdigital macerations noted b/l LE. Toenails 1-5 b/l elongated, discolored, dystrophic, thickened, crumbly with subungual debris and tenderness to dorsal palpation. Hyperkeratotic lesion(s) b/l lower extremities.  No erythema, no edema, no drainage, no fluctuance. Skin b/l lower extremities noted to be thickened and brawny consistent with lymphedema.  Orthopedic: Muscle strength 5/5 to all lower extremity muscle groups bilaterally. Pes planus deformity noted bilateral LE. Utilizes cane for ambulation assistance.   Radiographs:  None  Assessment/Plan: 1. Pain due to onychomycosis of toenails of both feet   2. Callus   3. PVD (peripheral vascular disease) ([place])     -Patient was evaluated and treated. All patient's and/or POA's questions/concerns answered on today's visit. -ABN signed for callus for today's visit.. -Patient to continue soft, supportive shoe gear daily. -Toenails 1-5 b/l were debrided in length and girth with sterile nail nippers and dremel without iatrogenic bleeding.  -Callus(es) submet head 5 left foot pared utilizing sterile scalpel blade without complication or incident. Total number debrided =1. -Patient/POA to call should there be question/concern in the interim.   Return in about 10 weeks (around 02/28/2022).  Marzetta Board, DPM

## 2022-02-28 ENCOUNTER — Ambulatory Visit: Payer: BC Managed Care – PPO | Admitting: Podiatry

## 2022-03-28 ENCOUNTER — Ambulatory Visit: Payer: BC Managed Care – PPO | Admitting: Podiatry

## 2022-03-28 ENCOUNTER — Ambulatory Visit: Payer: BC Managed Care – PPO | Admitting: Cardiology

## 2022-03-29 ENCOUNTER — Encounter: Payer: Self-pay | Admitting: Cardiology

## 2022-05-09 ENCOUNTER — Ambulatory Visit: Payer: BC Managed Care – PPO | Admitting: Podiatry

## 2022-05-09 DIAGNOSIS — B351 Tinea unguium: Secondary | ICD-10-CM | POA: Diagnosis not present

## 2022-05-09 DIAGNOSIS — I739 Peripheral vascular disease, unspecified: Secondary | ICD-10-CM | POA: Diagnosis not present

## 2022-05-09 DIAGNOSIS — L84 Corns and callosities: Secondary | ICD-10-CM

## 2022-05-09 DIAGNOSIS — M79675 Pain in left toe(s): Secondary | ICD-10-CM

## 2022-05-09 DIAGNOSIS — M79674 Pain in right toe(s): Secondary | ICD-10-CM | POA: Diagnosis not present

## 2022-05-09 NOTE — Progress Notes (Signed)
  Subjective:  Patient ID: Colin Hartmann., male    DOB: June 08, 1961,  MRN: [id number]  Colin Hartmann. presents to clinic today for at risk foot care. Patient has h/o PAD and callus(es) b/l lower extremities and painful thick toenails that are difficult to trim. Painful toenails interfere with ambulation. Aggravating factors include wearing enclosed shoe gear. Pain is relieved with periodic professional debridement. Painful calluses are aggravated when weightbearing with and without shoegear. Pain is relieved with periodic professional debridement.   Patient states he had a fall in the bathtub. He did not hit his head. He is having someone install grab bars today. Chief Complaint  Patient presents with   Nail Problem    Nail Trim Not Diabetic  PCP - Dr [name] , last OV July 2023   Callouses    Bilateral callouses    New problem(s): None.   PCP is Raina Mina., MD , and last visit was January 14, 2022.  No Known Allergies  Review of Systems: Negative except as noted in the HPI.  Objective: No changes noted in today's physical examination.  Colin Hartmann. is a pleasant 61 y.o. male morbidly obese in NAD. AAO x 3. Vascular CFT <3 seconds b/l LE. Diminished DP/PT pulses b/l LE. Pedal hair absent. No pain with calf compression b/l. Evidence of chronic venous insufficiency b/l LE. Lymphedema present BLE. No ischemia or gangrene noted b/l LE. No cyanosis or clubbing noted b/l LE.  Neurologic Normal speech. Oriented to person, place, and time. Protective sensation intact 5/5 intact bilaterally with 10g monofilament b/l. Vibratory sensation diminished b/l.  Dermatologic No open wounds b/l LE. No interdigital macerations noted b/l LE. Toenails 1-5 b/l elongated, discolored, dystrophic, thickened, crumbly with subungual debris and tenderness to dorsal palpation.   Porokeratotic lesions submet head 5 b/l  and b/l heels of the lower extremities.  No erythema, no edema, no drainage, no  fluctuance.   Skin b/l lower extremities noted to be thickened and brawny consistent with lymphedema.  Orthopedic: Muscle strength 5/5 to all lower extremity muscle groups bilaterally. Pes planus deformity noted bilateral LE. Utilizes cane for ambulation assistance.   Radiographs: None  Assessment/Plan: 1. Pain due to onychomycosis of toenails of both feet   2. Callus   3. PVD (peripheral vascular disease) ([place])     No orders of the defined types were placed in this encounter.   -Consent given for treatment as described below: -Examined patient. -Mycotic toenails 1-5 bilaterally were debrided in length and girth with sterile nail nippers and dremel without incident. -Porokeratotic lesion(s) bilateral heels and submet head 5 b/l pared and enucleated with sterile currette without incident. Total number of lesions debrided=4. -Patient/POA to call should there be question/concern in the interim.   Return in about 3 months (around 08/09/2022).  Marzetta Board, DPM

## 2022-05-13 ENCOUNTER — Encounter: Payer: Self-pay | Admitting: Podiatry

## 2022-05-21 ENCOUNTER — Ambulatory Visit: Payer: BC Managed Care – PPO | Attending: Cardiology | Admitting: Cardiology

## 2022-05-21 ENCOUNTER — Encounter: Payer: Self-pay | Admitting: Cardiology

## 2022-05-21 VITALS — BP 156/83 | HR 74 | Ht 69.0 in | Wt >= 6400 oz

## 2022-05-21 DIAGNOSIS — I110 Hypertensive heart disease with heart failure: Secondary | ICD-10-CM | POA: Diagnosis not present

## 2022-05-21 DIAGNOSIS — I480 Paroxysmal atrial fibrillation: Secondary | ICD-10-CM | POA: Diagnosis not present

## 2022-05-21 DIAGNOSIS — I5032 Chronic diastolic (congestive) heart failure: Secondary | ICD-10-CM

## 2022-05-21 DIAGNOSIS — E782 Mixed hyperlipidemia: Secondary | ICD-10-CM | POA: Diagnosis not present

## 2022-05-21 DIAGNOSIS — Z7901 Long term (current) use of anticoagulants: Secondary | ICD-10-CM

## 2022-05-21 NOTE — Progress Notes (Signed)
Cardiology Office Note:    Date:  05/21/2022   ID:  Colin Hartmann., DOB Mar 18, 1961, MRN [id number]  PCP:  Raina Mina., MD  Cardiologist:  Shirlee More, MD    Referring MD: Raina Mina., MD    ASSESSMENT:    1. Hypertensive heart disease with chronic diastolic congestive heart failure ([place])   2. PAF (paroxysmal atrial fibrillation) ([place])   3. Chronic anticoagulation   4. Hypertensive heart failure ([place])   5. Mixed hyperlipidemia    PLAN:    In order of problems listed above:  Doing well no recurrent atrial fibrillation currently not on an antiarrhythmic drug and continues beta-blocker and anticoagulant.  I do think treatment of his obstructive sleep apnea has been beneficial I told him he may want to see an ENT surgeon to see if resection of soft tissue posterior pharynx would reduce some of the burden of his obstructive sleep apnea. He will continue his current anticoagulant Continue his current antihypertensive ARB and loop diuretic Continue his statin LDL is at target   Next appointment: 1 year   Medication Adjustments/Labs and Tests Ordered: Current medicines are reviewed at length with the patient today.  Concerns regarding medicines are outlined above.  No orders of the defined types were placed in this encounter.  No orders of the defined types were placed in this encounter.   Chief complaint follow-up atrial fibrillation   History of Present Illness:    Colin Coltrane. is a 61 y.o. male with a hx of hypertensive heart disease with chronic diastolic heart failure paroxysmal atrial fibrillation and an unprovoked pulmonary embolism on long-term anticoagulation last seen 11/29/2020.  Compliance with diet, lifestyle and medications: Yes  He is fully retired now. He has had no indication of recurrent atrial fibrillation no palpitation edema shortness of breath or syncope. Quite a fall in the bathroom he fell into a Jacuzzi tub and could not get out for  24 hours.  I asked him to purchase the Apple watch from both a safety perspective being able to call for help as well as monitoring recurrent atrial arrhythmia. He is considering semaglutide for weight loss His sleep apnea has worsened. No bleeding from his anticoagulant Past Medical History:  Diagnosis Date   Acute diastolic CHF (congestive heart failure) ([place]) 07/21/2014   Anemia, chronic disease 12/25/2015   Arthritis    "back" (07/20/2014)   Atrial fibrillation with rapid ventricular response ([place]) 07/20/2014   Bilateral pleural effusion 07/21/2014   Cellulitis 09/19/2016   Chronic anemia    Chronic anticoagulation 01/14/2015   Chronic diastolic heart failure ([place]) 01/14/2015   Chronic gout of multiple sites 12/25/2015   Coronary artery calcification seen on CT scan 01/14/2015   Overview:  Dense calcifications on CT and normal coranaries on catheterization Jan 2016   DDD (degenerative disc disease), lumbosacral 12/25/2015   Demand ischemia 07/20/2014   False positive serological test for hepatitis C 41/93/7902   Folic acid deficiency 40/97/3532   Glaucoma    Glaucoma 12/25/2015   Gout    Hematuria 01/16/2016   Overview:  [place]   High risk medication use 12/25/2015   HTN (hypertension) 07/20/2014   Hypertension    Kidney cyst, acquired 01/16/2017   Overview:  Complex. Felt benign by [place]   Kidney stone 12/25/2015   Kidney stones    "multiple"   Malaise and fatigue 12/25/2015   Mild CAD 07/21/2014   Mixed hyperlipidemia 12/25/2015   Morbid obesity ([place]) 08/29/2010  pt is 500 lbs   Morbid obesity with BMI of 60.0-69.9, adult ([place]) 12/25/2015   New onset atrial fibrillation ([place]) 07/19/2014   w/RVR   OSA on CPAP    Osteoarthritis of multiple joints 12/25/2015   PAF (paroxysmal atrial fibrillation) ([place]) 01/14/2015   Prediabetes 12/25/2015   Pulmonary emboli ([place]) 07/21/2014   UTI (urinary tract infection) 07/20/2014    Past Surgical History:  Procedure  Laterality Date   CARDIAC CATHETERIZATION  03/2013   "no blockages"   CYSTOSCOPY W/ STONE MANIPULATION  ~ 2010   LAPAROSCOPIC GASTRIC SLEEVE RESECTION  08/2013   "gastric sleeve"   LEFT HEART CATHETERIZATION WITH CORONARY ANGIOGRAM N/A 07/20/2014   Procedure: LEFT HEART CATHETERIZATION WITH CORONARY ANGIOGRAM;  Surgeon: Wellington Hampshire, MD;  Location: [facility];  Service: Cardiovascular;  Laterality: N/A;   TONSILLECTOMY  05/1965    Current Medications: No outpatient medications have been marked as taking for the 05/21/22 encounter (Office Visit) with Richardo Priest, MD.     Allergies:   Patient has no known allergies.   Social History   Socioeconomic History   Marital status: Single    Spouse name: Not on file   Number of children: Not on file   Years of education: Not on file   Highest education level: Not on file  Occupational History   Not on file  Tobacco Use   Smoking status: Never   Smokeless tobacco: Never  Vaping Use   Vaping Use: Never used  Substance and Sexual Activity   Alcohol use: Yes    Comment: 07/20/2014 "might drink q 2-3 years"   Drug use: Yes    Types: Marijuana    Comment: "in my college years"   Sexual activity: Not Currently  Other Topics Concern   Not on file  Social History Narrative   Not on file   Social Determinants of Health   Financial Resource Strain: Not on file  Food Insecurity: Not on file  Transportation Needs: Not on file  Physical Activity: Not on file  Stress: Not on file  Social Connections: Not on file     Family History: The patient's family history includes Dementia in his mother; Diabetes in his father; Heart attack in his daughter. ROS:   Please see the history of present illness.    All other systems reviewed and are negative.  EKGs/Labs/Other Studies Reviewed:    The following studies were reviewed today:  EKG:  EKG ordered today and personally reviewed.  The ekg ordered today demonstrates sinus rhythm  right bundle branch block Echocardiogram 2016 showed EF of 55% mild concentric LVH elevated left atrial pressure mild left atrial enlargement and right atrial enlargement mild elevation of pulmonary artery systolic pressure. Recent Labs: 01/14/2022 [facility] PCP: Cholesterol 142 LDL 66 triglycerides 67 HDL 61 sodium 140 potassium 4.1 creatinine 4.2 Hemoglobin 12.5 platelets 226,000  Physical Exam:    VS:  BP (!) 156/83 (BP Location: Left Wrist, Patient Position: Sitting)   Pulse 74   Ht 5\' 9"  (1.753 m)   Wt (!) 492 lb 12.8 oz (223.5 kg)   SpO2 98%   BMI 72.77 kg/m     Wt Readings from Last 3 Encounters:  05/21/22 (!) 492 lb 12.8 oz (223.5 kg)  11/29/20 (!) 515 lb 6.4 oz (233.8 kg)  06/03/18 (!) 513 lb 6.4 oz (232.9 kg)     GEN: Morbidly obese BMI exceeds 70 well nourished, well developed in no acute distress HEENT: Normal  NECK: No JVD; No carotid bruits LYMPHATICS: No lymphadenopathy CARDIAC: Distant heart sounds RRR, no murmurs, rubs, gallops RESPIRATORY:  Clear to auscultation without rales, wheezing or rhonchi  ABDOMEN: Soft, non-tender, non-distended MUSCULOSKELETAL:  No edema; No deformity  SKIN: Warm and dry NEUROLOGIC:  Alert and oriented x 3 PSYCHIATRIC:  Normal affect    Signed, Shirlee More, MD  05/21/2022 4:14 PM    [facility]

## 2022-05-21 NOTE — Patient Instructions (Signed)
Medication Instructions:  Your physician recommends that you continue on your current medications as directed. Please refer to the Current Medication list given to you today.  *If you need a refill on your cardiac medications before your next appointment, please call your pharmacy*   Lab Work: None If you have labs (blood work) drawn today and your tests are completely normal, you will receive your results only by: MyChart Message (if you have MyChart) OR A paper copy in the mail If you have any lab test that is abnormal or we need to change your treatment, we will call you to review the results.   Testing/Procedures: None   Follow-Up: At [facility], you and your health needs are our priority.  As part of our continuing mission to provide you with exceptional heart care, we have created designated Provider Care Teams.  These Care Teams include your primary Cardiologist (physician) and Advanced Practice Providers (APPs -  Physician Assistants and Nurse Practitioners) who all work together to provide you with the care you need, when you need it.  We recommend signing up for the patient portal called "MyChart".  Sign up information is provided on this After Visit Summary.  MyChart is used to connect with patients for Virtual Visits (Telemedicine).  Patients are able to view lab/test results, encounter notes, upcoming appointments, etc.  Non-urgent messages can be sent to your provider as well.   To learn more about what you can do with MyChart, go to [email or web address].    Your next appointment:   1 year(s)  The format for your next appointment:   In Person  Provider:   Brian Munley, MD    Other Instructions None  Important Information About Sugar       

## 2022-08-22 ENCOUNTER — Ambulatory Visit: Payer: BC Managed Care – PPO | Admitting: Podiatry

## 2022-08-22 VITALS — BP 157/83 | HR 78

## 2022-08-22 DIAGNOSIS — I739 Peripheral vascular disease, unspecified: Secondary | ICD-10-CM

## 2022-08-22 DIAGNOSIS — Q828 Other specified congenital malformations of skin: Secondary | ICD-10-CM

## 2022-08-22 DIAGNOSIS — M79675 Pain in left toe(s): Secondary | ICD-10-CM

## 2022-08-22 DIAGNOSIS — M79674 Pain in right toe(s): Secondary | ICD-10-CM | POA: Diagnosis not present

## 2022-08-22 DIAGNOSIS — B351 Tinea unguium: Secondary | ICD-10-CM | POA: Diagnosis not present

## 2022-08-25 ENCOUNTER — Encounter: Payer: Self-pay | Admitting: Podiatry

## 2022-08-25 NOTE — Progress Notes (Signed)
  Subjective:  Patient ID: Colin Hartmann., male    DOB: Aug 15, 1960,  MRN: [id number]  Colin Hartmann. presents to clinic today for at risk foot care. Patient has h/o PAD and painful porokeratotic lesion(s) both feet and painful mycotic toenails that limit ambulation. Painful toenails interfere with ambulation. Aggravating factors include wearing enclosed shoe gear. Pain is relieved with periodic professional debridement. Painful porokeratotic lesions are aggravated when weightbearing with and without shoegear. Pain is relieved with periodic professional debridement.  Chief Complaint  Patient presents with   NAIL CARE    [place] TOOK BP MEDICINE THIS MORNING   Callouses    CALLUS BILAT    New problem(s): None.   PCP is Raina Mina., MD.  No Known Allergies  Review of Systems: Negative except as noted in the HPI.  Objective: No changes noted in today's physical examination. Vitals:   08/22/22 1056  BP: (!) 157/83  Pulse: [place]. is a pleasant 62 y.o. male morbidly obese in NAD. AAO x 3. Vascular CFT <3 seconds b/l LE. Diminished DP/PT pulses b/l LE. Pedal hair absent. No pain with calf compression b/l. Lymphedema present BLE. No ischemia or gangrene noted b/l LE. No cyanosis or clubbing noted b/l LE.  Neurologic Normal speech. Oriented to person, place, and time. Protective sensation intact 5/5 intact bilaterally with 10g monofilament b/l. Vibratory sensation diminished b/l.  Dermatologic No open wounds b/l LE. No interdigital macerations noted b/l LE. Toenails 1-5 b/l elongated, discolored, dystrophic, thickened, crumbly with subungual debris and tenderness to dorsal palpation.   Porokeratotic lesions submet head 5 b/l and sub 5th metatarsal base RLE. No erythema, no edema, no drainage, no fluctuance.   Skin b/l lower extremities noted to be thickened and brawny consistent with lymphedema.  Orthopedic: Muscle strength 5/5 to all lower extremity muscle groups  bilaterally. Pes planus deformity noted bilateral LE. Utilizes cane for ambulation assistance.   Radiographs: None  Assessment/Plan: 1. Pain due to onychomycosis of toenails of both feet   2. Porokeratosis   3. PVD (peripheral vascular disease) ([place])     -Patient was evaluated and treated. All patient's and/or POA's questions/concerns answered on today's visit. -Patient to continue soft, supportive shoe gear daily. -Toenails 1-5 b/l were debrided in length and girth with sterile nail nippers and dremel without iatrogenic bleeding.  -Porokeratotic lesion(s) submet head 5 b/l and sub 5th met base right lower extremity pared and enucleated with sterile currette without incident. Total number of lesions debrided=3. -Patient/POA to call should there be question/concern in the interim.   Return in about 3 months (around 11/20/2022).  Colin Hall, DPM

## 2022-11-07 ENCOUNTER — Other Ambulatory Visit: Payer: Self-pay | Admitting: Urology

## 2022-11-07 DIAGNOSIS — R310 Gross hematuria: Secondary | ICD-10-CM

## 2022-11-08 ENCOUNTER — Ambulatory Visit: Payer: BC Managed Care – PPO | Admitting: Podiatry

## 2022-12-10 ENCOUNTER — Ambulatory Visit: Payer: BC Managed Care – PPO | Admitting: Podiatry

## 2022-12-10 ENCOUNTER — Encounter: Payer: Self-pay | Admitting: Podiatry

## 2022-12-10 VITALS — BP 161/98 | HR 60

## 2022-12-10 DIAGNOSIS — M79675 Pain in left toe(s): Secondary | ICD-10-CM

## 2022-12-10 DIAGNOSIS — B351 Tinea unguium: Secondary | ICD-10-CM

## 2022-12-10 DIAGNOSIS — L84 Corns and callosities: Secondary | ICD-10-CM

## 2022-12-10 DIAGNOSIS — I739 Peripheral vascular disease, unspecified: Secondary | ICD-10-CM

## 2022-12-10 DIAGNOSIS — M79674 Pain in right toe(s): Secondary | ICD-10-CM | POA: Diagnosis not present

## 2022-12-10 NOTE — Progress Notes (Signed)
  Subjective:  Patient ID: Colin Ben., male    DOB: 12/29/1960,  MRN: [id number]  Colin Ben. presents to clinic today for at risk foot care. Patient has h/o PAD and callus(es) b/l lower extremities and painful thick toenails that are difficult to trim. Painful toenails interfere with ambulation. Aggravating factors include wearing enclosed shoe gear. Pain is relieved with periodic professional debridement. Painful calluses are aggravated when weightbearing with and without shoegear. Pain is relieved with periodic professional debridement.  Chief Complaint  Patient presents with   Routine foot care    Callouses    Callous on bottom of left foot "painful   New problem(s): None.   PCP is Gordan Payment., MD.  No Known Allergies  Review of Systems: Negative except as noted in the HPI.  Objective: No changes noted in today's physical examination. Vitals:   12/10/22 1637 12/10/22 1730  BP: (!) 160/90 (!) 161/98  Pulse: 62 60  SpO2: 95%    Colin Ben. is a pleasant 62 y.o. male morbidly obese in NAD. AAO x 3. Vascular Examination: CFT <4 seconds b/l. DP pulses diminished b/l. PT pulses diminished b/l. Digital hair absent. Skin temperature gradient warm to wamrl b/l. No ischemia or gangrene. No cyanosis or clubbing noted b/l. Lymphedema present BLE.   Neurological Examination: Sensation grossly intact b/l with 10 gram monofilament. Vibratory sensation diminished b/l.  Dermatological Examination: Pedal skin thin, shiny and atrophic b/l. No open wounds. No interdigital macerations.   Toenails 1-5 b/l thick, discolored, elongated with subungual debris and pain on dorsal palpation.   Hyperkeratotic lesion(s) submet head 5 right foot and sub 5th met base right lower extremity.  No erythema, no edema, no drainage, no fluctuance. Preulcerative lesion noted submet head 5 left foot. There is visible subdermal hemorrhage. There is no surrounding erythema, no edema, no drainage,  no odor, no fluctuance. Skin b/l lower extremities noted to be thickened and brawny consistent with lymphedema.  Musculoskeletal Examination: Muscle strength 5/5 to b/l LE. Pes planus deformity noted bilateral LE.  Radiographs: None  Assessment/Plan: 1. Pain due to onychomycosis of toenails of both feet   2. Pre-ulcerative calluses   3. PVD (peripheral vascular disease) ([facility])     -Patient was evaluated and treated. All patient's and/or POA's questions/concerns answered on today's visit. -Patient advised to keep a log of his blood pressure readings and if they are consistently elevated or he experiences symptoms such as dizziness, headaches or blurred vision, contact Cardiologist. He related understanding . -Patient to continue soft, supportive shoe gear daily. -Toenails 1-5 b/l were debrided in length and girth with sterile nail nippers and dremel without iatrogenic bleeding.  -Callus(es) submet head 5 right foot and sub 5th met base right lower extremity pared utilizing sterile scalpel blade without complication or incident. Total number debrided =2. -Preulcerative lesion pared submet head 5 left foot utilizing sterile scalpel blade. Total number pared=1. -Patient/POA to call should there be question/concern in the interim.   Return in about 9 weeks (around 02/11/2023).  Freddie Breech, DPM

## 2022-12-12 ENCOUNTER — Ambulatory Visit
Admission: RE | Admit: 2022-12-12 | Discharge: 2022-12-12 | Disposition: A | Discharge status: Home / Self Care | Payer: BC Managed Care – PPO | Source: Ambulatory Visit | Attending: Urology | Admitting: Urology

## 2022-12-12 DIAGNOSIS — R310 Gross hematuria: Secondary | ICD-10-CM

## 2022-12-12 MED ORDER — IOPAMIDOL (ISOVUE-300) INJECTION 61%
150.0000 mL | Freq: Once | INTRAVENOUS | Status: AC | PRN
  Administered 2022-12-12: 150 mL via INTRAVENOUS

## 2022-12-20 ENCOUNTER — Other Ambulatory Visit: Payer: Self-pay | Admitting: Urology

## 2022-12-27 NOTE — Patient Instructions (Signed)
DUE TO COVID-19 ONLY TWO VISITORS  (aged 63 and older)  ARE ALLOWED TO COME WITH YOU AND STAY IN THE WAITING ROOM ONLY DURING PRE OP AND PROCEDURE.   **NO VISITORS ARE ALLOWED IN THE SHORT STAY AREA OR RECOVERY ROOM!!**  IF YOU WILL BE ADMITTED INTO THE HOSPITAL YOU ARE ALLOWED ONLY FOUR SUPPORT PEOPLE DURING VISITATION HOURS ONLY (7 AM -8PM)   The support person(s) must pass our screening, gel in and out, and wear a mask at all times, including in the patient's room. Patients must also wear a mask when staff or their support person are in the room. Visitors GUEST BADGE MUST BE WORN VISIBLY  One adult visitor may remain with you overnight and MUST be in the room by 8 P.M.     Your procedure is scheduled on: 01/13/23   Report to [facility] Main Entrance    Report to admitting at : 5:15 AM   Call this number if you have problems the morning of surgery [phone]   Do not eat food or drink :After Midnight.   Oral Hygiene is also important to reduce your risk of infection.                                    Remember - BRUSH YOUR TEETH THE MORNING OF SURGERY WITH YOUR REGULAR TOOTHPASTE  DENTURES WILL BE REMOVED PRIOR TO SURGERY PLEASE DO NOT APPLY "Poly grip" OR ADHESIVES!!!   Do NOT smoke after Midnight   Take these medicines the morning of surgery with A SIP OF WATER: metoprolol,diltiazem,allopurinol,allopurinol.  DO NOT TAKE ANY ORAL DIABETIC MEDICATIONS DAY OF YOUR SURGERY  Bring CPAP mask and tubing day of surgery.                              You may not have any metal on your body including hair pins, jewelry, and body piercing             Do not wear lotions, powders, perfumes/cologne, or deodorant              Men may shave face and neck.   Do not bring valuables to the hospital. [facility] IS NOT             RESPONSIBLE   FOR VALUABLES.   Contacts, glasses, or bridgework may not be worn into surgery.   Bring small overnight bag day of surgery.   DO NOT  BRING YOUR HOME MEDICATIONS TO THE HOSPITAL. PHARMACY WILL DISPENSE MEDICATIONS LISTED ON YOUR MEDICATION LIST TO YOU DURING YOUR ADMISSION IN THE HOSPITAL!    Patients discharged on the day of surgery will not be allowed to drive home.  Someone NEEDS to stay with you for the first 24 hours after anesthesia.   Special Instructions: Bring a copy of your healthcare power of attorney and living will documents         the day of surgery if you haven't scanned them before.              Please read over the following fact sheets you were given: IF YOU HAVE QUESTIONS ABOUT YOUR PRE-OP INSTRUCTIONS PLEASE CALL [phone]    [facility] - Preparing for Surgery Before surgery, you can play an important role.  Because skin is not sterile, your skin needs to be as free  of germs as possible.  You can reduce the number of germs on your skin by washing with CHG (chlorahexidine gluconate) soap before surgery.  CHG is an antiseptic cleaner which kills germs and bonds with the skin to continue killing germs even after washing. Please DO NOT use if you have an allergy to CHG or antibacterial soaps.  If your skin becomes reddened/irritated stop using the CHG and inform your nurse when you arrive at Short Stay. Do not shave (including legs and underarms) for at least 48 hours prior to the first CHG shower.  You may shave your face/neck. Please follow these instructions carefully:  1.  Shower with CHG Soap the night before surgery and the  morning of Surgery.  2.  If you choose to wash your hair, wash your hair first as usual with your  normal  shampoo.  3.  After you shampoo, rinse your hair and body thoroughly to remove the  shampoo.                           4.  Use CHG as you would any other liquid soap.  You can apply chg directly  to the skin and wash                       Gently with a scrungie or clean washcloth.  5.  Apply the CHG Soap to your body ONLY FROM THE NECK DOWN.   Do not use on face/ open                            Wound or open sores. Avoid contact with eyes, ears mouth and genitals (private parts).                       Wash face,  Genitals (private parts) with your normal soap.             6.  Wash thoroughly, paying special attention to the area where your surgery  will be performed.  7.  Thoroughly rinse your body with warm water from the neck down.  8.  DO NOT shower/wash with your normal soap after using and rinsing off  the CHG Soap.                9.  Pat yourself dry with a clean towel.            10.  Wear clean pajamas.            11.  Place clean sheets on your bed the night of your first shower and do not  sleep with pets. Day of Surgery : Do not apply any lotions/deodorants the morning of surgery.  Please wear clean clothes to the hospital/surgery center.  FAILURE TO FOLLOW THESE INSTRUCTIONS MAY RESULT IN THE CANCELLATION OF YOUR SURGERY PATIENT SIGNATURE_________________________________  NURSE SIGNATURE__________________________________  ________________________________________________________________________

## 2022-12-30 ENCOUNTER — Encounter (HOSPITAL_COMMUNITY)
Admission: RE | Admit: 2022-12-30 | Discharge: 2022-12-30 | Disposition: A | Discharge status: Home / Self Care | Payer: BC Managed Care – PPO | Source: Ambulatory Visit | Attending: Urology | Admitting: Urology

## 2022-12-30 ENCOUNTER — Encounter (HOSPITAL_COMMUNITY): Payer: Self-pay

## 2022-12-30 ENCOUNTER — Other Ambulatory Visit: Payer: Self-pay

## 2022-12-30 VITALS — BP 152/85 | HR 62 | Temp 98.2°F | Ht 68.0 in | Wt >= 6400 oz

## 2022-12-30 DIAGNOSIS — N35919 Unspecified urethral stricture, male, unspecified site: Secondary | ICD-10-CM | POA: Insufficient documentation

## 2022-12-30 DIAGNOSIS — Z01818 Encounter for other preprocedural examination: Secondary | ICD-10-CM | POA: Diagnosis not present

## 2022-12-30 DIAGNOSIS — I4891 Unspecified atrial fibrillation: Secondary | ICD-10-CM | POA: Insufficient documentation

## 2022-12-30 DIAGNOSIS — I10 Essential (primary) hypertension: Secondary | ICD-10-CM

## 2022-12-30 DIAGNOSIS — N201 Calculus of ureter: Secondary | ICD-10-CM | POA: Insufficient documentation

## 2022-12-30 DIAGNOSIS — I2510 Atherosclerotic heart disease of native coronary artery without angina pectoris: Secondary | ICD-10-CM | POA: Diagnosis not present

## 2022-12-30 DIAGNOSIS — I110 Hypertensive heart disease with heart failure: Secondary | ICD-10-CM | POA: Insufficient documentation

## 2022-12-30 DIAGNOSIS — G4733 Obstructive sleep apnea (adult) (pediatric): Secondary | ICD-10-CM | POA: Insufficient documentation

## 2022-12-30 DIAGNOSIS — I5032 Chronic diastolic (congestive) heart failure: Secondary | ICD-10-CM | POA: Insufficient documentation

## 2022-12-30 HISTORY — DX: Prediabetes: R73.03

## 2022-12-30 HISTORY — DX: Dyspnea, unspecified: R06.00

## 2022-12-30 LAB — BASIC METABOLIC PANEL
Anion gap: 7 (ref 5–15)
BUN: 24 mg/dL — ABNORMAL HIGH (ref 8–23)
CO2: 25 mmol/L (ref 22–32)
Calcium: 8.9 mg/dL (ref 8.9–10.3)
Chloride: 107 mmol/L (ref 98–111)
Creatinine, Ser: 0.89 mg/dL (ref 0.61–1.24)
GFR, Estimated: >60 mL/min (ref >60)
Glucose, Bld: 95 mg/dL (ref 70–99)
Potassium: 4 mmol/L (ref 3.5–5.1)
Sodium: 139 mmol/L (ref 135–145)

## 2022-12-30 LAB — CBC
HCT: 39 % (ref 39.0–52.0)
Hemoglobin: 12.1 g/dL — ABNORMAL LOW (ref 13.0–17.0)
MCH: 28.3 pg (ref 26.0–34.0)
MCHC: 31 g/dL (ref 30.0–36.0)
MCV: 91.3 fL (ref 80.0–100.0)
Platelets: 225 10*3/uL (ref 150–400)
RBC: 4.27 MIL/uL (ref 4.22–5.81)
RDW: 16.3 % — ABNORMAL HIGH (ref 11.5–15.5)
WBC: 4.6 10*3/uL (ref 4.0–10.5)
nRBC: 0 % (ref 0.0–0.2)

## 2022-12-30 NOTE — Progress Notes (Signed)
For Short Stay: COVID SWAB appointment date:  Bowel Prep reminder:   For Anesthesia: PCP - Dr. Feliciana Rossetti Cardiologist - Dr. Norman Herrlich. LOV: 05/21/22  Chest x-ray -  EKG - 12/30/22 Stress Test -  ECHO -07/20/14  Cardiac Cath - 2016 Pacemaker/ICD device last checked: Pacemaker orders received: Device Rep notified:  Spinal Cord Stimulator: N/A  Sleep Study - Yes CPAP - Yes  Fasting Blood Sugar - N/A Checks Blood Sugar ___0__ times a day Date and result of last Hgb A1c-5.8: 07/22/22  Last dose of GLP1 agonist- N/A GLP1 instructions:   Last dose of SGLT-2 inhibitors- N/A SGLT-2 instructions:   Blood Thinner Instructions: Xarelto will be on hold after: 01/09/23 Aspirin Instructions: Last Dose:  Activity level: Can go up a flight of stairs and activities of daily living without stopping and without chest pain and/or shortness of breath   Able to exercise without chest pain and/or shortness of breath   Unable to go up a flight of stairs without shortness of breath    Anesthesia review: Hx: PAFib,HTN,PE,Chronic HF,Pre-DIA,OSA(CPAP)  Patient denies shortness of breath, fever, cough and chest pain at PAT appointment   Patient verbalized understanding of instructions that were given to them at the PAT appointment. Patient was also instructed that they will need to review over the PAT instructions again at home before surgery.

## 2023-01-01 ENCOUNTER — Telehealth: Payer: Self-pay | Admitting: Cardiology

## 2023-01-01 NOTE — Telephone Encounter (Signed)
I left a voicemail to return a call to the office to schedule a tele appt.

## 2023-01-01 NOTE — Telephone Encounter (Signed)
   Name: Colin Hall.  DOB: Dec 04, 1960  MRN: [id number]  Primary Cardiologist: None   Preoperative team, please contact this patient and set up a phone call appointment for further preoperative risk assessment. Please obtain consent and complete medication review. Thank you for your help.  I confirm that guidance regarding antiplatelet and oral anticoagulation therapy has been completed and, if necessary, noted below.  Per pharm D, can hold Xarelto for 2 days prior to procedure.    Carlos Levering, NP 01/01/2023, 4:37 PM  [facility]

## 2023-01-01 NOTE — Telephone Encounter (Signed)
Please advise holding Xarelto prior to urethral dilatation and ureteroscopy.  Thank you!  DW

## 2023-01-01 NOTE — Telephone Encounter (Signed)
Patient with diagnosis of afib and unprovoked pulmonary embolism in 2016 on Xarelto for anticoagulation.    Procedure: Urethral Dilation and Ureteroscopy  /Date of procedure: 01/13/23   CHA2DS2-VASc Score = 3   This indicates a 3.2% annual risk of stroke. The patient's score is based upon: CHF History: 1 HTN History: 1 Diabetes History: 0 Stroke History: 0 Vascular Disease History: 1 Age Score: 0 Gender Score: 0      CrCl >179ml/min  Per office protocol, patient can hold Xarelto for 2 days prior to procedure.    **This guidance is not considered finalized until pre-operative APP has relayed final recommendations.**

## 2023-01-01 NOTE — Telephone Encounter (Signed)
   Pre-operative Risk Assessment    Patient Name: Colin Hall.  DOB: [birth year] MRN: [id number]      Request for Surgical Clearance    Procedure:   Urethral Dilation and Ureteroscopy  Date of Surgery:  Clearance 01/13/23                                 Surgeon:  Dr. Modena Slater Surgeon's Group or Practice Name:  [facility] Phone number:  [phone] Fax number:  [phone]   Type of Clearance Requested:   - Pharmacy:  Hold Rivaroxaban (Xarelto) Hold 2 days Prior   Type of Anesthesia:  General    Additional requests/questions:  Please advise surgeon/provider what medications should be held.  Signed, Belisicia T Woods   01/01/2023, 1:31 PM

## 2023-01-03 ENCOUNTER — Telehealth: Payer: Self-pay | Admitting: *Deleted

## 2023-01-03 NOTE — Telephone Encounter (Signed)
CALL [phone] MED REC AND CONSENT DONE. SMO

## 2023-01-03 NOTE — Telephone Encounter (Signed)
  Patient Consent for Virtual Visit        Colin Hnat. has provided verbal consent on 01/03/2023 for a virtual visit (video or telephone).   CONSENT FOR VIRTUAL VISIT FOR:  Colin Ben.  By participating in this virtual visit I agree to the following:  I hereby voluntarily request, consent and authorize [facility] and its employed or contracted physicians, physician assistants, nurse practitioners or other licensed health care professionals (the Practitioner), to provide me with telemedicine health care services (the "Services") as deemed necessary by the treating Practitioner. I acknowledge and consent to receive the Services by the Practitioner via telemedicine. I understand that the telemedicine visit will involve communicating with the Practitioner through live audiovisual communication technology and the disclosure of certain medical information by electronic transmission. I acknowledge that I have been given the opportunity to request an in-person assessment or other available alternative prior to the telemedicine visit and am voluntarily participating in the telemedicine visit.  I understand that I have the right to withhold or withdraw my consent to the use of telemedicine in the course of my care at any time, without affecting my right to future care or treatment, and that the Practitioner or I may terminate the telemedicine visit at any time. I understand that I have the right to inspect all information obtained and/or recorded in the course of the telemedicine visit and may receive copies of available information for a reasonable fee.  I understand that some of the potential risks of receiving the Services via telemedicine include:  Delay or interruption in medical evaluation due to technological equipment failure or disruption; Information transmitted may not be sufficient (e.g. poor resolution of images) to allow for appropriate medical decision making by the  Practitioner; and/or  In rare instances, security protocols could fail, causing a breach of personal health information.  Furthermore, I acknowledge that it is my responsibility to provide information about my medical history, conditions and care that is complete and accurate to the best of my ability. I acknowledge that Practitioner's advice, recommendations, and/or decision may be based on factors not within their control, such as incomplete or inaccurate data provided by me or distortions of diagnostic images or specimens that may result from electronic transmissions. I understand that the practice of medicine is not an exact science and that Practitioner makes no warranties or guarantees regarding treatment outcomes. I acknowledge that a copy of this consent can be made available to me via my patient portal [facility] MyChart), or I can request a printed copy by calling the office of [facility].    I understand that my insurance will be billed for this visit.   I have read or had this consent read to me. I understand the contents of this consent, which adequately explains the benefits and risks of the Services being provided via telemedicine.  I have been provided ample opportunity to ask questions regarding this consent and the Services and have had my questions answered to my satisfaction. I give my informed consent for the services to be provided through the use of telemedicine in my medical care

## 2023-01-03 NOTE — Telephone Encounter (Signed)
MED REC AND CONSENT DONE APPT 7-9-. SMO

## 2023-01-04 NOTE — Progress Notes (Unsigned)
Virtual Visit via Telephone Note   Because of Colin BERENGUER Jr.'s co-morbid illnesses, he is at least at moderate risk for complications without adequate follow up.  This format is felt to be most appropriate for this patient at this time.  The patient did not have access to video technology/had technical difficulties with video requiring transitioning to audio format only (telephone).  All issues noted in this document were discussed and addressed.  No physical exam could be performed with this format.  Please refer to the patient's chart for his consent to telehealth for [facility].  Evaluation Performed:  Preoperative cardiovascular risk assessment _____________   Date:  01/07/2023   Patient ID:  Colin Ben., DOB 09/16/1960, MRN [id number] Patient Location:  Home Provider location:   Office  Primary Care Provider:  Gordan Payment., MD Primary Cardiologist:  Colin Herrlich, MD  Chief Complaint / Patient Profile   62 y.o. y/o male with a h/o PAF, coronary artery calcification seen on CT, chronic diastolic heart failure, PE, OSA on CPAP, hypertension, hyperlipidemia who is pending urethral dilatation and ureteroscopy by Dr. Alvester Morin and presents today for telephonic preoperative cardiovascular risk assessment.  History of Present Illness    Colin Caperton. is a 62 y.o. male who presents via audio/video conferencing for a telehealth visit today.  Pt was last seen in cardiology clinic on 05/21/2022 by Dr. Dulce Sellar.  At that time Colin Delpozo. was doing well.  The patient is now pending procedure as outlined above. Since his last visit, he is doing well from a cardiac standpoint. Patient reports baseline shortness of breath and dyspnea with heavier exertion secondary to body habitus that is unchanged from previous. No chest pain, pressure, or tightness. He reports chronic lower extremity edema managed with Lasix and elevation. No orthopnea or PND. No palpitations. He was doing  water aerobics regularly but had to stop about 9 months ago. He is independent with ADLs and light to moderate household chores.    Past Medical History    Past Medical History:  Diagnosis Date   Acute diastolic CHF (congestive heart failure) (HCC) 07/21/2014   Anemia, chronic disease 12/25/2015   Arthritis    "back" (07/20/2014)   Atrial fibrillation with rapid ventricular response (HCC) 07/20/2014   Bilateral pleural effusion 07/21/2014   Cellulitis 09/19/2016   Chronic anemia    Chronic anticoagulation 01/14/2015   Chronic diastolic heart failure (HCC) 01/14/2015   Chronic gout of multiple sites 12/25/2015   Coronary artery calcification seen on CT scan 01/14/2015   Overview:  Dense calcifications on CT and normal coranaries on catheterization Jan 2016   DDD (degenerative disc disease), lumbosacral 12/25/2015   Demand ischemia 07/20/2014   Dyspnea    False positive serological test for hepatitis C 01/16/2017   Folic acid deficiency 12/25/2015   Glaucoma    Glaucoma 12/25/2015   Gout    Hematuria 01/16/2016   Overview:  [place]   High risk medication use 12/25/2015   HTN (hypertension) 07/20/2014   Hypertension    Kidney cyst, acquired 01/16/2017   Overview:  Complex. Felt benign by [place]   Kidney stone 12/25/2015   Kidney stones    "multiple"   Malaise and fatigue 12/25/2015   Mild CAD 07/21/2014   Mixed hyperlipidemia 12/25/2015   Morbid obesity (HCC) 08/29/2010   pt is 500 lbs   Morbid obesity with BMI of 60.0-69.9, adult (HCC) 12/25/2015   New onset atrial fibrillation (HCC)  07/19/2014   w/RVR   OSA on CPAP    Osteoarthritis of multiple joints 12/25/2015   PAF (paroxysmal atrial fibrillation) (HCC) 01/14/2015   Pre-diabetes    Prediabetes 12/25/2015   Pulmonary emboli (HCC) 07/21/2014   UTI (urinary tract infection) 07/20/2014   Past Surgical History:  Procedure Laterality Date   CARDIAC CATHETERIZATION  03/2013   "no blockages"   CYSTOSCOPY W/ STONE  MANIPULATION  ~ 2010   LAPAROSCOPIC GASTRIC SLEEVE RESECTION  08/2013   "gastric sleeve"   LEFT HEART CATHETERIZATION WITH CORONARY ANGIOGRAM N/A 07/20/2014   Procedure: LEFT HEART CATHETERIZATION WITH CORONARY ANGIOGRAM;  Surgeon: Iran Ouch, MD;  Location: [facility];  Service: Cardiovascular;  Laterality: N/A;   TONSILLECTOMY  05/1965    Allergies  No Known Allergies  Home Medications    Prior to Admission medications   Medication Sig Start Date End Date Taking? Authorizing Provider  acetaminophen (TYLENOL) 650 MG CR tablet Take 1,300 mg by mouth every 8 (eight) hours as needed for pain.    [provider]  allopurinol (ZYLOPRIM) 100 MG tablet Take 200 mg by mouth daily. 12/25/20   [provider]  diltiazem (CARDIZEM CD) 120 MG 24 hr capsule Take 120 mg by mouth daily. 12/01/19   [provider]  furosemide (LASIX) 20 MG tablet Take 20 mg by mouth daily as needed for edema. 12/25/20   [provider]  latanoprost (XALATAN) 0.005 % ophthalmic solution Place 1 drop into both eyes nightly. 12/15/19   [provider]  losartan (COZAAR) 100 MG tablet Take 100 mg by mouth daily.    [provider]  metoprolol tartrate (LOPRESSOR) 25 MG tablet Take 1 tablet (25 mg total) by mouth 2 (two) times daily. 07/23/14   Janetta Hora, PA-C  Multiple Vitamin (MULTI-VITAMIN DAILY) TABS Take 1 tablet by mouth daily.    [provider]  mupirocin ointment (BACTROBAN) 2 % Apply 1 application  topically daily as needed (boils/ulcers).    [provider]  rivaroxaban (XARELTO) 20 MG TABS tablet Take 20 mg by mouth every evening. 12/25/20   [provider]  rosuvastatin (CRESTOR) 10 MG tablet Take 10 mg by mouth every evening. 12/25/20   [provider]  tamsulosin (FLOMAX) 0.4 MG CAPS capsule Take 0.4 mg by mouth daily. 09/22/19   [provider]  Trospium Chloride 60 MG CP24 Take 1 capsule by mouth daily.  11/09/20   [provider]  Urea 41 % CREA Apply to calloused skin once daily. Patient taking differently: Apply 1 Application topically daily as needed (calluses). 09/21/20   Freddie Breech, DPM  vitamin B-12 (CYANOCOBALAMIN) 1000 MCG tablet Take 1,000 mcg by mouth daily.    [provider]    Physical Exam    Vital Signs:  Colin Ben. does not have vital signs available for review today.  Given telephonic nature of communication, physical exam is limited. AAOx3. NAD. Normal affect.  Speech and respirations are unlabored.  Accessory Clinical Findings    None  Assessment & Plan    Primary Cardiologist: Colin Herrlich, MD  Preoperative cardiovascular risk assessment.  Urethral dilatation and ureteroscopy by Dr. Alvester Morin.  Chart reviewed as part of pre-operative protocol coverage. According to the RCRI, patient has a 0.9% risk of MACE. Patient reports activity equivalent to 5.07 METS (Per DASI).   Given past medical history and time since last visit, based on ACC/AHA guidelines, Halsey Crumedy. would be at  acceptable risk for the planned procedure without further cardiovascular testing.   Patient was advised that if he develops new symptoms prior to surgery to contact our office to arrange a follow-up appointment.  he verbalized understanding.  Per Pharm.D., patient may hold Xarelto for 2 days prior to procedure.  I will route this recommendation to the requesting party via Epic fax function.  Please call with questions.  Time:   Today, I have spent 7 minutes with the patient with telehealth technology discussing medical history, symptoms, and management plan.     Carlos Levering, NP  01/07/2023, 1:46 PM

## 2023-01-07 ENCOUNTER — Ambulatory Visit: Payer: BC Managed Care – PPO | Attending: Cardiology | Admitting: Student

## 2023-01-07 DIAGNOSIS — Z01810 Encounter for preprocedural cardiovascular examination: Secondary | ICD-10-CM

## 2023-01-07 NOTE — Progress Notes (Signed)
Anesthesia Chart Review   Case: [id number] Date/Time: 01/13/23 0715   Procedures:      CYSTOSCOPY WITH DIRECT VISION INTERNAL URETHROTOMY VERSUS URETHRAL DILATION     BILATERAL URETEROSCOPY/HOLMIUM LASER/STENT PLACEMENT (Bilateral) - 90 MINS FOR CASE   Anesthesia type: General   Pre-op diagnosis: URETHRAL STRICTURE BILATERAL RENAL/URETERAL STONES   Location: [facility] PROC[facility] / [facility]   Surgeons: Crista Elliot, MD       DISCUSSION:62 y.o. never smoker with h/o OSA on CPAP, HTN, atrial fibrillation, coronary artery calcification seen on CT, chronic diastolic heart failure, PE, urethral stricture, bilateral renal/ureteral stone scheduled for above procedure 01/13/2023 with Dr. Modena Slater.   Per cardiology preoperative evaluation 01/07/2023, "Chart reviewed as part of pre-operative protocol coverage. According to the RCRI, patient has a 0.9% risk of MACE. Patient reports activity equivalent to 5.07 METS (Per DASI).    Given past medical history and time since last visit, based on ACC/AHA guidelines, Tajai Lennartz. would be at acceptable risk for the planned procedure without further cardiovascular testing.  Per Pharm.D., patient may hold Xarelto for 2 days prior to procedure. "  Anticipate pt can proceed with planned procedure barring acute status change.   VS: BP (!) 152/85   Pulse 62   Temp 36.8 C (Oral)   Ht 5\' 8"  (1.727 m)   Wt (!) 207.7 kg   SpO2 98%   BMI 69.64 kg/m   PROVIDERS: Gordan Payment., MDis PCP   Primary Cardiologist:  Norman Herrlich, MD  LABS: Labs reviewed: Acceptable for surgery. (all labs ordered are listed, but only abnormal results are displayed)  Labs Reviewed  BASIC METABOLIC PANEL - Abnormal; Notable for the following components:      Result Value   BUN 24 (*)    All other components within normal limits  CBC - Abnormal; Notable for the following components:   Hemoglobin 12.1 (*)    RDW 16.3 (*)    All other components within normal limits      IMAGES:   EKG:   CV: Echo 07/20/2014 - Left ventricle: The cavity size was normal. Wall thickness was    increased in a pattern of mild LVH. Septal bounce noted. The    estimated ejection fraction was 55%. Although no diagnostic    regional wall motion abnormality was identified, this possibility    cannot be completely excluded on the basis of this study.    Features are consistent with a pseudonormal left ventricular    filling pattern, with concomitant abnormal relaxation and    increased filling pressure (grade 2 diastolic dysfunction).  - Aortic valve: There was no stenosis.  - Mitral valve: There was no significant regurgitation.  - Left atrium: The atrium was mildly dilated.  - Right ventricle: The cavity size was normal. Systolic function    was normal.  - Right atrium: The atrium was mildly dilated.  - Tricuspid valve: Peak RV-RA gradient (S): 33 mm Hg.  - Pulmonary arteries: PA peak pressure: 41 mm Hg (S).  - Systemic veins: IVC measured 2.2 cm with < 50% respirophasic    variation, suggesting RA pressure 8 mmHg.  Past Medical History:  Diagnosis Date   Acute diastolic CHF (congestive heart failure) (HCC) 07/21/2014   Anemia, chronic disease 12/25/2015   Arthritis    "back" (07/20/2014)   Atrial fibrillation with rapid ventricular response (HCC) 07/20/2014   Bilateral pleural effusion 07/21/2014   Cellulitis 09/19/2016   Chronic anemia  Chronic anticoagulation 01/14/2015   Chronic diastolic heart failure (HCC) 01/14/2015   Chronic gout of multiple sites 12/25/2015   Coronary artery calcification seen on CT scan 01/14/2015   Overview:  Dense calcifications on CT and normal coranaries on catheterization Jan 2016   DDD (degenerative disc disease), lumbosacral 12/25/2015   Demand ischemia 07/20/2014   Dyspnea    False positive serological test for hepatitis C 01/16/2017   Folic acid deficiency 12/25/2015   Glaucoma    Glaucoma 12/25/2015   Gout     Hematuria 01/16/2016   Overview:  [place]   High risk medication use 12/25/2015   HTN (hypertension) 07/20/2014   Hypertension    Kidney cyst, acquired 01/16/2017   Overview:  Complex. Felt benign by [place]   Kidney stone 12/25/2015   Kidney stones    "multiple"   Malaise and fatigue 12/25/2015   Mild CAD 07/21/2014   Mixed hyperlipidemia 12/25/2015   Morbid obesity (HCC) 08/29/2010   pt is 500 lbs   Morbid obesity with BMI of 60.0-69.9, adult (HCC) 12/25/2015   New onset atrial fibrillation (HCC) 07/19/2014   w/RVR   OSA on CPAP    Osteoarthritis of multiple joints 12/25/2015   PAF (paroxysmal atrial fibrillation) (HCC) 01/14/2015   Pre-diabetes    Prediabetes 12/25/2015   Pulmonary emboli (HCC) 07/21/2014   UTI (urinary tract infection) 07/20/2014    Past Surgical History:  Procedure Laterality Date   CARDIAC CATHETERIZATION  03/2013   "no blockages"   CYSTOSCOPY W/ STONE MANIPULATION  ~ 2010   LAPAROSCOPIC GASTRIC SLEEVE RESECTION  08/2013   "gastric sleeve"   LEFT HEART CATHETERIZATION WITH CORONARY ANGIOGRAM N/A 07/20/2014   Procedure: LEFT HEART CATHETERIZATION WITH CORONARY ANGIOGRAM;  Surgeon: Iran Ouch, MD;  Location: [facility];  Service: Cardiovascular;  Laterality: N/A;   TONSILLECTOMY  05/1965    MEDICATIONS:  acetaminophen (TYLENOL) 650 MG CR tablet   allopurinol (ZYLOPRIM) 100 MG tablet   diltiazem (CARDIZEM CD) 120 MG 24 hr capsule   furosemide (LASIX) 20 MG tablet   latanoprost (XALATAN) 0.005 % ophthalmic solution   losartan (COZAAR) 100 MG tablet   metoprolol tartrate (LOPRESSOR) 25 MG tablet   Multiple Vitamin (MULTI-VITAMIN DAILY) TABS   mupirocin ointment (BACTROBAN) 2 %   rivaroxaban (XARELTO) 20 MG TABS tablet   rosuvastatin (CRESTOR) 10 MG tablet   tamsulosin (FLOMAX) 0.4 MG CAPS capsule   Trospium Chloride 60 MG CP24   Urea 41 % CREA   vitamin B-12 (CYANOCOBALAMIN) 1000 MCG tablet   No current facility-administered medications  for this encounter.    Jodell Cipro Ward, PA-C [facility] [phone]

## 2023-01-11 NOTE — Anesthesia Preprocedure Evaluation (Addendum)
Anesthesia Evaluation  Patient identified by MRN, date of birth, ID band Patient awake    Reviewed: Allergy & Precautions, NPO status , Patient's Chart, lab work & pertinent test results  Airway Mallampati: III  TM Distance: >3 FB Neck ROM: Full    Dental no notable dental hx. (+) Teeth Intact, Dental Advisory Given   Pulmonary sleep apnea and Continuous Positive Airway Pressure Ventilation , PE   Pulmonary exam normal breath sounds clear to auscultation       Cardiovascular hypertension, +CHF  Normal cardiovascular exam(-) dysrhythmias  Rhythm:Regular Rate:Normal     Neuro/Psych  negative psych ROS   GI/Hepatic negative GI ROS, Neg liver ROS,,,  Endo/Other    Morbid obesity  Renal/GU Lab Results      Component                Value               Date                      NA                       139                 12/30/2022                CL                       107                 12/30/2022                K                        4.0                 12/30/2022                CO2                      25                  12/30/2022                BUN                      24 (H)              12/30/2022                CREATININE               0.89                12/30/2022                GFRNONAA                 >60                 12/30/2022                CALCIUM                  8.9                 12/30/2022  PHOS                     3.3                 04/19/2014                ALBUMIN                  2.5 (L)             09/19/2016                GLUCOSE                  95                  12/30/2022                Musculoskeletal  (+) Arthritis , Osteoarthritis,    Abdominal   Peds  Hematology Lab Results      Component                Value               Date                      WBC                      4.6                 12/30/2022                HGB                      12.1 (L)             12/30/2022                HCT                      39.0                12/30/2022                MCV                      91.3                12/30/2022                PLT                      225                 12/30/2022              Anesthesia Other Findings   Reproductive/Obstetrics                             Anesthesia Physical Anesthesia Plan  ASA: 3  Anesthesia Plan: General   Post-op Pain Management: Minimal or no pain anticipated   Induction: Intravenous  PONV Risk Score and Plan: 3 and Treatment may vary due to age or medical condition, Ondansetron and Dexamethasone  Airway Management Planned: LMA  Additional Equipment: None  Intra-op Plan:   Post-operative Plan:   Informed Consent: I have reviewed the  patients History and Physical, chart, labs and discussed the procedure including the risks, benefits and alternatives for the proposed anesthesia with the patient or authorized representative who has indicated his/her understanding and acceptance.     Dental advisory given  Plan Discussed with: CRNA and Anesthesiologist  Anesthesia Plan Comments:         Anesthesia Quick Evaluation

## 2023-01-13 ENCOUNTER — Ambulatory Visit (HOSPITAL_COMMUNITY): Payer: BC Managed Care – PPO | Admitting: Medical

## 2023-01-13 ENCOUNTER — Encounter (HOSPITAL_COMMUNITY)
Admission: RE | Disposition: A | Discharge status: Home / Self Care | Payer: Self-pay | Source: Ambulatory Visit | Attending: Urology

## 2023-01-13 ENCOUNTER — Other Ambulatory Visit: Payer: Self-pay

## 2023-01-13 ENCOUNTER — Ambulatory Visit (HOSPITAL_COMMUNITY)
Admission: RE | Admit: 2023-01-13 | Discharge: 2023-01-13 | Disposition: A | Discharge status: Home / Self Care | Payer: BC Managed Care – PPO | Source: Ambulatory Visit | Attending: Urology | Admitting: Urology

## 2023-01-13 ENCOUNTER — Ambulatory Visit (HOSPITAL_COMMUNITY): Payer: BC Managed Care – PPO

## 2023-01-13 ENCOUNTER — Encounter (HOSPITAL_COMMUNITY): Payer: Self-pay | Admitting: Urology

## 2023-01-13 ENCOUNTER — Ambulatory Visit (HOSPITAL_COMMUNITY): Payer: BC Managed Care – PPO | Admitting: Anesthesiology

## 2023-01-13 DIAGNOSIS — M1990 Unspecified osteoarthritis, unspecified site: Secondary | ICD-10-CM | POA: Diagnosis not present

## 2023-01-13 DIAGNOSIS — G4730 Sleep apnea, unspecified: Secondary | ICD-10-CM | POA: Diagnosis not present

## 2023-01-13 DIAGNOSIS — I509 Heart failure, unspecified: Secondary | ICD-10-CM | POA: Diagnosis not present

## 2023-01-13 DIAGNOSIS — Z6841 Body Mass Index (BMI) 40.0 and over, adult: Secondary | ICD-10-CM | POA: Diagnosis not present

## 2023-01-13 DIAGNOSIS — N35912 Unspecified bulbous urethral stricture, male: Secondary | ICD-10-CM | POA: Diagnosis not present

## 2023-01-13 DIAGNOSIS — I110 Hypertensive heart disease with heart failure: Secondary | ICD-10-CM | POA: Diagnosis not present

## 2023-01-13 DIAGNOSIS — N132 Hydronephrosis with renal and ureteral calculous obstruction: Secondary | ICD-10-CM | POA: Diagnosis present

## 2023-01-13 HISTORY — PX: CYSTOSCOPY/URETEROSCOPY/HOLMIUM LASER/STENT PLACEMENT: SHX6546

## 2023-01-13 SURGERY — CYSTOSCOPY/URETEROSCOPY/HOLMIUM LASER/STENT PLACEMENT
Anesthesia: General | Laterality: Bilateral

## 2023-01-13 MED ORDER — DEXAMETHASONE SODIUM PHOSPHATE 10 MG/ML IJ SOLN
INTRAMUSCULAR | Status: DC | PRN
  Administered 2023-01-13: 5 mg via INTRAVENOUS

## 2023-01-13 MED ORDER — ACETAMINOPHEN 10 MG/ML IV SOLN: 1000.0000 mg | Freq: Once | INTRAVENOUS | Status: DC | PRN

## 2023-01-13 MED ORDER — FENTANYL CITRATE (PF) 100 MCG/2ML IJ SOLN
INTRAMUSCULAR | Status: AC
  Filled 2023-01-13: qty 2

## 2023-01-13 MED ORDER — EPHEDRINE SULFATE-NACL 50-0.9 MG/10ML-% IV SOSY
PREFILLED_SYRINGE | INTRAVENOUS | Status: DC | PRN
  Administered 2023-01-13: 5 mg via INTRAVENOUS
  Administered 2023-01-13: 10 mg via INTRAVENOUS

## 2023-01-13 MED ORDER — PROPOFOL 10 MG/ML IV BOLUS
INTRAVENOUS | Status: AC
  Filled 2023-01-13: qty 20

## 2023-01-13 MED ORDER — PROPOFOL 10 MG/ML IV BOLUS
INTRAVENOUS | Status: DC | PRN
  Administered 2023-01-13: 250 mg via INTRAVENOUS

## 2023-01-13 MED ORDER — LIDOCAINE HCL (PF) 2 % IJ SOLN
INTRAMUSCULAR | Status: AC
  Filled 2023-01-13: qty 5

## 2023-01-13 MED ORDER — ONDANSETRON HCL 4 MG/2ML IJ SOLN
INTRAMUSCULAR | Status: AC
  Filled 2023-01-13: qty 2

## 2023-01-13 MED ORDER — FENTANYL CITRATE PF 50 MCG/ML IJ SOSY: 25.0000 ug | PREFILLED_SYRINGE | INTRAMUSCULAR | Status: DC | PRN

## 2023-01-13 MED ORDER — DEXAMETHASONE SODIUM PHOSPHATE 10 MG/ML IJ SOLN
INTRAMUSCULAR | Status: AC
  Filled 2023-01-13: qty 1

## 2023-01-13 MED ORDER — DEXMEDETOMIDINE HCL IN NACL 200 MCG/50ML IV SOLN
INTRAVENOUS | Status: DC | PRN
  Administered 2023-01-13: 8 ug via INTRAVENOUS

## 2023-01-13 MED ORDER — LACTATED RINGERS IV SOLN: INTRAVENOUS | Status: DC

## 2023-01-13 MED ORDER — IOHEXOL 300 MG/ML  SOLN
INTRAMUSCULAR | Status: DC | PRN
  Administered 2023-01-13: 30 mL

## 2023-01-13 MED ORDER — ORAL CARE MOUTH RINSE: 15.0000 mL | Freq: Once | OROMUCOSAL | Status: AC

## 2023-01-13 MED ORDER — ONDANSETRON HCL 4 MG/2ML IJ SOLN: 4.0000 mg | Freq: Once | INTRAMUSCULAR | Status: DC | PRN

## 2023-01-13 MED ORDER — CEFAZOLIN IN SODIUM CHLORIDE 3-0.9 GM/100ML-% IV SOLN
3.0000 g | INTRAVENOUS | Status: AC
  Administered 2023-01-13: 3 g via INTRAVENOUS
  Filled 2023-01-13: qty 100

## 2023-01-13 MED ORDER — MIDAZOLAM HCL 5 MG/5ML IJ SOLN
INTRAMUSCULAR | Status: DC | PRN
  Administered 2023-01-13: 2 mg via INTRAVENOUS

## 2023-01-13 MED ORDER — MIDAZOLAM HCL 2 MG/2ML IJ SOLN
INTRAMUSCULAR | Status: AC
  Filled 2023-01-13: qty 2

## 2023-01-13 MED ORDER — ONDANSETRON HCL 4 MG/2ML IJ SOLN
INTRAMUSCULAR | Status: DC | PRN
  Administered 2023-01-13 (×2): 4 mg via INTRAVENOUS

## 2023-01-13 MED ORDER — CHLORHEXIDINE GLUCONATE 0.12 % MT SOLN
15.0000 mL | Freq: Once | OROMUCOSAL | Status: AC
  Administered 2023-01-13: 15 mL via OROMUCOSAL

## 2023-01-13 MED ORDER — 0.9 % SODIUM CHLORIDE (POUR BTL) OPTIME
TOPICAL | Status: DC | PRN
  Administered 2023-01-13: 1000 mL

## 2023-01-13 MED ORDER — SODIUM CHLORIDE 0.9 % IR SOLN
Status: DC | PRN
  Administered 2023-01-13: 6000 mL via INTRAVESICAL

## 2023-01-13 MED ORDER — FENTANYL CITRATE (PF) 100 MCG/2ML IJ SOLN
INTRAMUSCULAR | Status: DC | PRN
  Administered 2023-01-13: 25 ug via INTRAVENOUS
  Administered 2023-01-13 (×2): 50 ug via INTRAVENOUS
  Administered 2023-01-13: 25 ug via INTRAVENOUS

## 2023-01-13 MED ORDER — LIDOCAINE 2% (20 MG/ML) 5 ML SYRINGE
INTRAMUSCULAR | Status: DC | PRN
  Administered 2023-01-13: 100 mg via INTRAVENOUS

## 2023-01-13 MED ORDER — STERILE WATER FOR IRRIGATION IR SOLN
Status: DC | PRN
  Administered 2023-01-13: 500 mL

## 2023-01-13 MED ORDER — HYDROCODONE-ACETAMINOPHEN 5-325 MG PO TABS: 1.0000 | ORAL_TABLET | ORAL | 0 refills | Status: DC | PRN

## 2023-01-13 SURGICAL SUPPLY — 37 items
BAG DRN RND TRDRP ANRFLXCHMBR (UROLOGICAL SUPPLIES) ×2
BAG URINE DRAIN 2000ML AR STRL (UROLOGICAL SUPPLIES) ×2 IMPLANT
BAG URO CATCHER STRL LF (MISCELLANEOUS) ×2 IMPLANT
BALLN NEPHROSTOMY (BALLOONS)
BALLOON NEPHROSTOMY (BALLOONS) IMPLANT
BASKET LASER NITINOL 1.9FR (BASKET) IMPLANT
BASKET ZERO TIP NITINOL 2.4FR (BASKET) ×1 IMPLANT
BSKT STON RTRVL 120 1.9FR (BASKET)
BSKT STON RTRVL ZERO TP 2.4FR (BASKET) ×2
CATH FOLEY 2W COUNCIL 20FR 5CC (CATHETERS) ×1 IMPLANT
CATH FOLEY 2WAY SLVR 5CC 18FR (CATHETERS) IMPLANT
CATH URET 5FR 70CM CONE TIP (BALLOONS) IMPLANT
CATH URETERAL DUAL LUMEN 10F (MISCELLANEOUS) IMPLANT
CATH URETL OPEN END 6FR 70 (CATHETERS) ×2 IMPLANT
CLOTH BEACON ORANGE TIMEOUT ST (SAFETY) ×2 IMPLANT
EXTRACTOR STONE 1.7FRX115CM (UROLOGICAL SUPPLIES) IMPLANT
GLOVE BIO SURGEON STRL SZ7.5 (GLOVE) ×2 IMPLANT
GOWN STRL REUS W/ TWL XL LVL3 (GOWN DISPOSABLE) ×2 IMPLANT
GOWN STRL REUS W/TWL XL LVL3 (GOWN DISPOSABLE) ×2
GUIDEWIRE ANG ZIPWIRE 038X150 (WIRE) IMPLANT
GUIDEWIRE STR DUAL SENSOR (WIRE) ×3 IMPLANT
KIT TURNOVER KIT A (KITS) IMPLANT
LASER FIB FLEXIVA PULSE ID 365 (Laser) IMPLANT
MANIFOLD NEPTUNE II (INSTRUMENTS) ×2 IMPLANT
NS IRRIG 1000ML POUR BTL (IV SOLUTION) IMPLANT
PACK CYSTO (CUSTOM PROCEDURE TRAY) ×2 IMPLANT
PENCIL SMOKE EVACUATOR (MISCELLANEOUS) IMPLANT
SHEATH NAVIGATOR HD 11/13X28 (SHEATH) IMPLANT
SHEATH NAVIGATOR HD 11/13X36 (SHEATH) ×1 IMPLANT
STENT URET 6FRX26 CONTOUR (STENTS) ×2 IMPLANT
SYR TOOMEY IRRIG 70ML (MISCELLANEOUS) ×2
SYRINGE TOOMEY IRRIG 70ML (MISCELLANEOUS) ×1 IMPLANT
TRACTIP FLEXIVA PULS ID 200XHI (Laser) ×1 IMPLANT
TRACTIP FLEXIVA PULSE ID 200 (Laser) ×2
TUBING CONNECTING 10 (TUBING) ×2 IMPLANT
TUBING UROLOGY SET (TUBING) ×2 IMPLANT
WATER STERILE IRR 3000ML UROMA (IV SOLUTION) ×1 IMPLANT

## 2023-01-13 NOTE — Anesthesia Procedure Notes (Signed)
Procedure Name: LMA Insertion Date/Time: 01/13/2023 7:40 AM  Performed by: Doran Clay, CRNAPre-anesthesia Checklist: Patient identified, Emergency Drugs available, Suction available, Timeout performed and Patient being monitored Patient Re-evaluated:Patient Re-evaluated prior to induction Oxygen Delivery Method: Circle system utilized Preoxygenation: Pre-oxygenation with 100% oxygen Induction Type: IV induction LMA: LMA inserted LMA Size: 5.0 Tube type: Oral Number of attempts: 1 Placement Confirmation: positive ETCO2 and breath sounds checked- equal and bilateral Tube secured with: Tape Dental Injury: Teeth and Oropharynx as per pre-operative assessment

## 2023-01-13 NOTE — Transfer of Care (Signed)
Immediate Anesthesia Transfer of Care Note  Patient: Colin Hall.  Procedure(s) Performed: CYSTOSCOPY, BILATERAL URETEROSCOPY/HOLMIUM LASER/STENT PLACEMENT (Bilateral)  Patient Location: PACU  Anesthesia Type:General  Level of Consciousness: sedated  Airway & Oxygen Therapy: Patient Spontanous Breathing and Patient connected to face mask oxygen  Post-op Assessment: Report given to RN and Post -op Vital signs reviewed and stable  Post vital signs: Reviewed and stable  Last Vitals:  Vitals Value Taken Time  BP 130/76 01/13/23 0933  Temp    Pulse 64 01/13/23 0934  Resp 18 01/13/23 0934  SpO2 98 % 01/13/23 0934  Vitals shown include unfiled device data.  Last Pain:  Vitals:   01/13/23 1610  TempSrc:   PainSc: 0-No pain         Complications: No notable events documented.

## 2023-01-13 NOTE — H&P (Signed)
CC/HPI: CC: lower urinary tract symptoms.  HPI:  62 year old male who is morbidly obese, 520 pounds. For the past 3-4 months, he has had a weaker stream, nocturia 2-3 times a night, some intermittency. He has no hesitancy. He denies hematuria or dysuria. He does have a history of gross hematuria and is status post negative workup except for the finding of bilateral renal calculi. He has not passed any stones in the interval. He does not have any pain.   03/18/2019  Patient continues on Flomax. He has been doing well with this. Urinary stream has improved. Less nocturia. He does however continue to have significant frequency and urgency with occasional urge urinary incontinence.   06/17/2019  Patient continues on Flomax. We were unable to get him in to the beta 3 agonist trial. As expected, his urinary urgency and occasional urge urinary incontinence is unchanged. No other complaints.   09/14/2019  In the interval, the patient tried Myrbetriq. Symptoms have improved. He has minimal incontinence. Still has some urgency but he is able to make it to the bathroom. He is pleased with the results. He has persistent microscopic hematuria. He had a negative hematuria workup by me in the past.   09/27/2020  Patient had an episode of gross hematuria a week ago. He has had a negative hematuria workup in the past. He declines repeat cystoscopy at this time. He is agreeable to a cytology. His insurance unfortunately notified him that they will not pay for Myrbetriq anymore. Therefore, he would like to try an anticholinergic.   09/20/2021  Patient been doing very well with the trospium. Minimal frequency and urgency. Nocturia x1. Continues on tamsulosin as well. He has no complaints in this regard. Microscopic hematuria is stable. Declines repeat hematuria work-up. Does recall a couple of episodes of gross hematuria in the interval. He is on Xarelto.   11/05/2022  Patient continues to do well on trospium and  tamsulosin. He notes a little bit of worsening of symptoms but not enough to change management. Has stable microscopic hematuria but has had a couple of episodes of gross hematuria. Has been several years since last hematuria workup. He is okay with repeating it hematuria workup at this time..   12/19/2022  Patient underwent a CT hematuria protocol that revealed a 13 mm left proximal ureteral calculus with left hydronephrosis with renal cortical thinning likely representing a chronic process. He also has a right renal calculus that was 17 mm. He is not having any left-sided flank pain.     ALLERGIES: No Allergies    MEDICATIONS: Allopurinol 100 mg tablet  Metoprolol Tartrate 25 mg tablet  Trospium Chloride Er 60 mg capsule, ext release 24 hr 1 capsule PO Daily  Diltiazem 24Hr Er 120 mg capsule, extended release 24hr  Furosemide 20 mg tablet  Latanoprost  Losartan Potassium 50 mg tablet  Multivitamin  Rosuvastatin Calcium 10 mg tablet  Vitamin B12  Xarelto 20 mg tablet     GU PSH: Cystoscopy - 2019     NON-GU PSH: Colonoscopy Gastric sleeve Tonsillectomy Visit Complexity (formerly GPC1X) - 11/05/2022     GU PMH: BPH w/LUTS - 11/05/2022, - 09/20/2021, - 2022, - 2020 Gross hematuria - 11/05/2022, - 09/20/2021, - 2022, - 2019 Urge incontinence - 11/05/2022, - 2019 Urinary Urgency - 11/05/2022, - 09/20/2021, - 2022, - 2020 Microscopic hematuria - 2020 Weak Urinary Stream - 2020 Renal calculus - 2019    NON-GU PMH: Arthritis Atrial Fibrillation Gout Hypertension Other heart failure Pulmonary  Embolism, History Sleep Apnea    FAMILY HISTORY: Alzheimer's Disease - Mother Death of family member - Mother, Father Diabetes - Father Glaucoma - Mother Kidney Stones - Mother Parkinson's Disease - Mother   SOCIAL HISTORY: Marital Status: Single Preferred Language: English; Ethnicity: Not Hispanic Or Latino; Race: Black or African American Current Smoking Status: Patient has never smoked.    Tobacco Use Assessment Completed: Used Tobacco in last 30 days? Does not use smokeless tobacco. Has never drank.  Drinks 1 caffeinated drink per day.    REVIEW OF SYSTEMS:    GU Review Male:   Patient denies frequent urination, hard to postpone urination, burning/ pain with urination, get up at night to urinate, leakage of urine, stream starts and stops, trouble starting your stream, have to strain to urinate , erection problems, and penile pain.  Gastrointestinal (Upper):   Patient denies nausea, vomiting, and indigestion/ heartburn.  Gastrointestinal (Lower):   Patient denies diarrhea and constipation.  Constitutional:   Patient denies fever, night sweats, weight loss, and fatigue.  Skin:   Patient denies skin rash/ lesion and itching.  Eyes:   Patient denies blurred vision and double vision.  Ears/ Nose/ Throat:   Patient denies sore throat and sinus problems.  Hematologic/Lymphatic:   Patient denies swollen glands and easy bruising.  Cardiovascular:   Patient denies leg swelling and chest pains.  Respiratory:   Patient denies cough and shortness of breath.  Endocrine:   Patient denies excessive thirst.  Musculoskeletal:   Patient denies back pain and joint pain.  Neurological:   Patient denies headaches and dizziness.  Psychologic:   Patient denies depression and anxiety.   VITAL SIGNS: None   MULTI-SYSTEM PHYSICAL EXAMINATION:    Gastrointestinal: No mass, no tenderness, no rigidity, morbidly obese abdomen.      Complexity of Data:  Source Of History:  Patient  Records Review:   Previous Doctor Records, Previous Patient Records  X-Ray Review: C.T. Abdomen/Pelvis: Reviewed Films. Reviewed Report. Discussed With Patient.     PROCEDURES:         Flexible Cystoscopy - 52000  Risks, benefits, and some of the potential complications of the procedure were discussed at length with the patient including infection, bleeding, voiding discomfort, urinary retention, fever, chills,  sepsis, and others. All questions were answered. Informed consent was obtained. Antibiotic prophylaxis was given. Sterile technique and intraurethral analgesia were used.  Meatus:  Normal size. Normal location. Normal condition.  Urethra:  Patient had a wide bore urethral stricture but unable to transversed with the scope      The lower urinary tract was carefully examined. The procedure was well-tolerated and without complications. Antibiotic instructions were given. Instructions were given to call the office immediately for bloody urine, difficulty urinating, urinary retention, painful or frequent urination, fever, chills, nausea, vomiting or other illness. The patient stated that he understood these instructions and would comply with them.         Urinalysis w/Scope Dipstick Dipstick Cont'd Micro  Color: Yellow Bilirubin: Neg mg/dL WBC/hpf: 20 - 40/JWJ  Appearance: Cloudy Ketones: Neg mg/dL RBC/hpf: 20 - 19/JYN  Specific Gravity: 1.020 Blood: 3+ ery/uL Bacteria: Few (10-25/hpf)  pH: 6.0 Protein: 1+ mg/dL Cystals: NS (Not Seen)  Glucose: Neg mg/dL Urobilinogen: 0.2 mg/dL Casts: NS (Not Seen)    Nitrites: Neg Trichomonas: Not Present    Leukocyte Esterase: 3+ leu/uL Mucous: Not Present      Epithelial Cells: 0 - 5/hpf      Yeast: NS (Not  Seen)      Sperm: Not Present    ASSESSMENT:      ICD-10 Details  1 GU:   Bulbar urethral stricture - N35.011 Undiagnosed New Problem  2   Gross hematuria - R31.0 Chronic, Stable  3   Urinary Urgency - R39.15 Chronic, Stable  4   Renal and ureteral calculus - N20.2 Undiagnosed New Problem   PLAN:           Orders Labs Urine Culture          Document Letter(s):  Created for Patient: Clinical Summary         Notes:   Will plan for cystoscopy with urethral dilation versus DVIU with bilateral ureteroscopy with laser lithotripsy and ureteral stent placement. Risk and benefits discussed we also discussed possibility of staged procedure.   cc: Dr  Shary Decamp    Signed by Modena Slater, III, M.D. on 12/19/22 at 3:18 PM (EDT

## 2023-01-13 NOTE — Anesthesia Postprocedure Evaluation (Signed)
Anesthesia Post Note  Patient: Colin Hall.  Procedure(s) Performed: CYSTOSCOPY, BILATERAL URETEROSCOPY/HOLMIUM LASER/STENT PLACEMENT (Bilateral)     Patient location during evaluation: PACU Anesthesia Type: General Level of consciousness: awake and alert Pain management: pain level controlled Vital Signs Assessment: post-procedure vital signs reviewed and stable Respiratory status: spontaneous breathing, nonlabored ventilation, respiratory function stable and patient connected to nasal cannula oxygen Cardiovascular status: blood pressure returned to baseline and stable Postop Assessment: no apparent nausea or vomiting Anesthetic complications: no   No notable events documented.  Last Vitals:  Vitals:   01/13/23 1000 01/13/23 1015  BP: (!) 153/88 (!) 151/88  Pulse: 63 63  Resp: 12 10  Temp:    SpO2: 97% 94%    Last Pain:  Vitals:   01/13/23 0945  TempSrc:   PainSc: 0-No pain                 Trevor Iha

## 2023-01-13 NOTE — Discharge Instructions (Signed)

## 2023-01-13 NOTE — Op Note (Addendum)
Operative Note  Preoperative diagnosis:  1.  Bilateral renal calculi, left ureteral calculi 2.  Bulbar urethral stricture  Postoperative diagnosis: Same  Procedure(s): 1.  Urethral dilation 2.  Cystoscopy with left ureteroscopy with laser lithotripsy, stone basketing, ureteral stent placement 3.  Right retrograde pyelogram with right ureteral stent placement  Surgeon: Modena Slater, MD  Assistants: None  Anesthesia: General  Complications: None immediate    EBL: Minimal  Specimens: 1.  Renal calculi   Drains/Catheters: 1.  Bilateral 6 x 26 double-J ureteral stent  2.  20 French council tip catheter  Intraoperative findings: 1.  Bulbar urethral stricture.  Difficult transilluminate.  Hopefully able to find it was able to dilate the urethral stricture cystoscope.  Stricture.  Within normal range.  This point, cystoscope dilated and passed easily.  Prostate was nonobstructing.  Bladder mucosa without any tumors or masses. 2.  Left ureteroscopy revealed impacted 1 cm proximal ureteral calculus fragmented tiny fragments.  Other calculi in the kidney fragmented smaller fragment some of which were basket extracted.  All stone was treated in the kidney.  Retrograde pyelogram revealed severe hydronephrosis on the left. 3.  Right retrograde pyelogram revealed no hydronephrosis.  Indication: 62 year old male with bilateral renal and ureteral calculi presents for previously mentioned operation.  Description of procedure:  The patient was identified and consent was obtained.  The patient was taken to the operating room and placed in the supine position.  The patient was placed under general anesthesia.  Perioperative antibiotics were administered.  The patient was placed in dorsal lithotomy.  Patient was prepped and draped in a standard sterile fashion and a timeout was performed.  A 21 French rigid cystoscope was advanced into the urethra.  I encountered the bulbar urethral stricture.   I did not see a true lumen.  However, I was able to pass a wire through the stricture.  Looked like there was a thin membrane.  I was able to advance the scope over the wire and dilate the stricture.  I advanced into the bladder and performed complete cystoscopy with placement above.  I advanced a sensor wire up the left ureter into the kidney.  A second wire was advanced alongside this up to the kidney.  I withdrew the scope.  1 the wires was secured to the drape as a safety wire.  The other wire was used to advance an 11 x 13 ureteral access sheath over the wire under continuous fluoroscopic guidance up to the proximal ureter.  Inner sheath and wire were withdrawn.  Digital ureteroscopy was performed and the impacted ureteral stone was carefully laser fragmented all dust settings to tiny fragments.  Some fragments were basket extracted.  Next the scope into the kidney and treated some stone they are in place to fragment all the stone to smaller pieces.  Some of these were basket extracted.  It was a large amount of stone burden but ultimately was treated with laser.  I shot a retrograde pyelogram through the scope with findings noted above.  I withdrew the scope and the access sheath visualizing the ureter upon removal.  There was some ureteral edema but level of stone impaction with evidence of chronic inflammation.  There was no evidence of any injury to the ureter.  The ureter was wide open and free of stenting.  I backloaded wire onto the rigid cystoscope and advanced that into the bladder followed by routine placement of a 6 x 26 double-J ureteral stent.  Fluoroscopy confirmed  proximal placement and direct visualization confirmed a good coil within the bladder.  I drained the bladder.  I advanced an open-ended ureteral catheter into the right ureteral orifice.  Retrograde pyelogram was performed with no abnormal findings.  I placed a 6 x 26 double-J ureteral stent in standard fashion followed by removal of  the wire.  Cystoscopy confirmed proximal placement and direct visualization confirmed a good coil within the bladder.  I withdrew the scope keeping a wire in the bladder.  I then placed a 20 [place] council tip catheter over the wire.  Patient tolerated the procedure well and stable postop.  Plan: Patient will need to be scheduled for a follow-up ureteroscopy on the right with left ureteral stent removal.

## 2023-01-14 ENCOUNTER — Encounter (HOSPITAL_COMMUNITY): Payer: Self-pay | Admitting: Urology

## 2023-01-14 ENCOUNTER — Other Ambulatory Visit: Payer: Self-pay | Admitting: Urology

## 2023-01-15 LAB — URINE CULTURE: Culture: >=100000 — AB

## 2023-01-16 NOTE — Progress Notes (Signed)
Patient phoned to give updated information on surgery.  Date of Surgery - 01-27-23  Arrival Time - 7:15 AM check in at admitting.    NPO Status - patient reminded to not eat solid food or drink liquids after midnight.    Medications morning of surgery - Allopurinol, Diltiazem, Trospium, Metoprolol, Tamsulosin, Hydrocodone or Tylenol if needed  No change in medical history, allergies per patient since surgery on 01-13-23  Transportation home - [facility] [phone]  All questions answered and patient stated understanding

## 2023-01-27 ENCOUNTER — Ambulatory Visit (HOSPITAL_COMMUNITY): Payer: BC Managed Care – PPO | Admitting: Anesthesiology

## 2023-01-27 ENCOUNTER — Encounter (HOSPITAL_COMMUNITY)
Admission: RE | Disposition: A | Discharge status: Home / Self Care | Payer: Self-pay | Source: Home / Self Care | Attending: Urology

## 2023-01-27 ENCOUNTER — Encounter (HOSPITAL_COMMUNITY): Payer: Self-pay | Admitting: Urology

## 2023-01-27 ENCOUNTER — Ambulatory Visit (HOSPITAL_COMMUNITY)
Admission: RE | Admit: 2023-01-27 | Discharge: 2023-01-27 | Disposition: A | Discharge status: Home / Self Care | Payer: BC Managed Care – PPO | Attending: Urology | Admitting: Urology

## 2023-01-27 ENCOUNTER — Ambulatory Visit (HOSPITAL_COMMUNITY): Payer: BC Managed Care – PPO

## 2023-01-27 DIAGNOSIS — Z6841 Body Mass Index (BMI) 40.0 and over, adult: Secondary | ICD-10-CM | POA: Diagnosis not present

## 2023-01-27 DIAGNOSIS — I5032 Chronic diastolic (congestive) heart failure: Secondary | ICD-10-CM | POA: Insufficient documentation

## 2023-01-27 DIAGNOSIS — N35912 Unspecified bulbous urethral stricture, male: Secondary | ICD-10-CM | POA: Diagnosis not present

## 2023-01-27 DIAGNOSIS — Z7901 Long term (current) use of anticoagulants: Secondary | ICD-10-CM | POA: Diagnosis not present

## 2023-01-27 DIAGNOSIS — Z79899 Other long term (current) drug therapy: Secondary | ICD-10-CM | POA: Insufficient documentation

## 2023-01-27 DIAGNOSIS — D649 Anemia, unspecified: Secondary | ICD-10-CM

## 2023-01-27 DIAGNOSIS — I2510 Atherosclerotic heart disease of native coronary artery without angina pectoris: Secondary | ICD-10-CM

## 2023-01-27 DIAGNOSIS — R310 Gross hematuria: Secondary | ICD-10-CM | POA: Insufficient documentation

## 2023-01-27 DIAGNOSIS — I110 Hypertensive heart disease with heart failure: Secondary | ICD-10-CM | POA: Diagnosis not present

## 2023-01-27 DIAGNOSIS — N132 Hydronephrosis with renal and ureteral calculous obstruction: Secondary | ICD-10-CM | POA: Insufficient documentation

## 2023-01-27 HISTORY — PX: CYSTOSCOPY/URETEROSCOPY/HOLMIUM LASER/STENT PLACEMENT: SHX6546

## 2023-01-27 LAB — BASIC METABOLIC PANEL
Anion gap: 9 (ref 5–15)
BUN: 26 mg/dL — ABNORMAL HIGH (ref 8–23)
CO2: 23 mmol/L (ref 22–32)
Calcium: 8.7 mg/dL — ABNORMAL LOW (ref 8.9–10.3)
Chloride: 104 mmol/L (ref 98–111)
Creatinine, Ser: 1.3 mg/dL — ABNORMAL HIGH (ref 0.61–1.24)
GFR, Estimated: >60 mL/min (ref >60)
Glucose, Bld: 124 mg/dL — ABNORMAL HIGH (ref 70–99)
Potassium: 4.1 mmol/L (ref 3.5–5.1)
Sodium: 136 mmol/L (ref 135–145)

## 2023-01-27 LAB — CBC
HCT: 42.8 % (ref 39.0–52.0)
Hemoglobin: 13.2 g/dL (ref 13.0–17.0)
MCH: 28.3 pg (ref 26.0–34.0)
MCHC: 30.8 g/dL (ref 30.0–36.0)
MCV: 91.8 fL (ref 80.0–100.0)
Platelets: 183 10*3/uL (ref 150–400)
RBC: 4.66 MIL/uL (ref 4.22–5.81)
RDW: 16 % — ABNORMAL HIGH (ref 11.5–15.5)
WBC: 10.4 10*3/uL (ref 4.0–10.5)
nRBC: 0 % (ref 0.0–0.2)

## 2023-01-27 SURGERY — CYSTOSCOPY/URETEROSCOPY/HOLMIUM LASER/STENT PLACEMENT
Anesthesia: General | Site: Ureter | Laterality: Bilateral

## 2023-01-27 MED ORDER — FENTANYL CITRATE (PF) 100 MCG/2ML IJ SOLN
INTRAMUSCULAR | Status: AC
  Filled 2023-01-27: qty 2

## 2023-01-27 MED ORDER — SODIUM CHLORIDE 0.9 % IR SOLN
Status: DC | PRN
  Administered 2023-01-27: 6000 mL

## 2023-01-27 MED ORDER — OXYCODONE HCL 5 MG/5ML PO SOLN
ORAL | Status: AC
  Filled 2023-01-27: qty 5

## 2023-01-27 MED ORDER — FENTANYL CITRATE (PF) 100 MCG/2ML IJ SOLN
INTRAMUSCULAR | Status: DC | PRN
  Administered 2023-01-27 (×3): 50 ug via INTRAVENOUS

## 2023-01-27 MED ORDER — GLYCOPYRROLATE 0.2 MG/ML IJ SOLN
INTRAMUSCULAR | Status: AC
  Filled 2023-01-27: qty 1

## 2023-01-27 MED ORDER — PHENYLEPHRINE 80 MCG/ML (10ML) SYRINGE FOR IV PUSH (FOR BLOOD PRESSURE SUPPORT)
PREFILLED_SYRINGE | INTRAVENOUS | Status: DC | PRN
  Administered 2023-01-27 (×2): 240 ug via INTRAVENOUS
  Administered 2023-01-27 (×3): 160 ug via INTRAVENOUS
  Administered 2023-01-27: 80 ug via INTRAVENOUS
  Administered 2023-01-27 (×2): 160 ug via INTRAVENOUS

## 2023-01-27 MED ORDER — PROPOFOL 10 MG/ML IV BOLUS
INTRAVENOUS | Status: AC
  Filled 2023-01-27: qty 20

## 2023-01-27 MED ORDER — ONDANSETRON HCL 4 MG/2ML IJ SOLN
INTRAMUSCULAR | Status: DC | PRN
  Administered 2023-01-27: 4 mg via INTRAVENOUS

## 2023-01-27 MED ORDER — PROPOFOL 10 MG/ML IV BOLUS
INTRAVENOUS | Status: DC | PRN
  Administered 2023-01-27: 200 mg via INTRAVENOUS

## 2023-01-27 MED ORDER — EPHEDRINE SULFATE-NACL 50-0.9 MG/10ML-% IV SOSY
PREFILLED_SYRINGE | INTRAVENOUS | Status: DC | PRN
Start: 2023-01-27 — End: 2023-01-27
  Administered 2023-01-27: 5 mg via INTRAVENOUS

## 2023-01-27 MED ORDER — FENTANYL CITRATE PF 50 MCG/ML IJ SOSY: 25.0000 ug | PREFILLED_SYRINGE | INTRAMUSCULAR | Status: DC | PRN

## 2023-01-27 MED ORDER — ACETAMINOPHEN 160 MG/5ML PO SOLN
ORAL | Status: AC
  Filled 2023-01-27: qty 20.3

## 2023-01-27 MED ORDER — EPHEDRINE 5 MG/ML INJ
INTRAVENOUS | Status: AC
  Filled 2023-01-27: qty 5

## 2023-01-27 MED ORDER — ONDANSETRON HCL 4 MG/2ML IJ SOLN
INTRAMUSCULAR | Status: AC
  Filled 2023-01-27: qty 2

## 2023-01-27 MED ORDER — GLYCOPYRROLATE PF 0.2 MG/ML IJ SOSY
PREFILLED_SYRINGE | INTRAMUSCULAR | Status: DC | PRN
  Administered 2023-01-27: .2 mg via INTRAVENOUS

## 2023-01-27 MED ORDER — DEXAMETHASONE SODIUM PHOSPHATE 10 MG/ML IJ SOLN
INTRAMUSCULAR | Status: DC | PRN
  Administered 2023-01-27: 5 mg via INTRAVENOUS

## 2023-01-27 MED ORDER — PHENYLEPHRINE 80 MCG/ML (10ML) SYRINGE FOR IV PUSH (FOR BLOOD PRESSURE SUPPORT)
PREFILLED_SYRINGE | INTRAVENOUS | Status: AC
  Filled 2023-01-27: qty 10

## 2023-01-27 MED ORDER — IOHEXOL 300 MG/ML  SOLN
INTRAMUSCULAR | Status: DC | PRN
  Administered 2023-01-27: 10 mL

## 2023-01-27 MED ORDER — LIDOCAINE HCL (PF) 2 % IJ SOLN
INTRAMUSCULAR | Status: AC
  Filled 2023-01-27: qty 5

## 2023-01-27 MED ORDER — ONDANSETRON HCL 4 MG/2ML IJ SOLN: 4.0000 mg | Freq: Once | INTRAMUSCULAR | Status: DC | PRN

## 2023-01-27 MED ORDER — LIDOCAINE 2% (20 MG/ML) 5 ML SYRINGE
INTRAMUSCULAR | Status: DC | PRN
  Administered 2023-01-27: 100 mg via INTRAVENOUS

## 2023-01-27 MED ORDER — LACTATED RINGERS IV SOLN: INTRAVENOUS | Status: DC

## 2023-01-27 MED ORDER — ORAL CARE MOUTH RINSE: 15.0000 mL | Freq: Once | OROMUCOSAL | Status: AC

## 2023-01-27 MED ORDER — MIDAZOLAM HCL 2 MG/2ML IJ SOLN
INTRAMUSCULAR | Status: AC
  Filled 2023-01-27: qty 2

## 2023-01-27 MED ORDER — DEXAMETHASONE SODIUM PHOSPHATE 10 MG/ML IJ SOLN
INTRAMUSCULAR | Status: AC
  Filled 2023-01-27: qty 1

## 2023-01-27 MED ORDER — 0.9 % SODIUM CHLORIDE (POUR BTL) OPTIME
TOPICAL | Status: DC | PRN
Start: 2023-01-27 — End: 2023-01-27
  Administered 2023-01-27: 1000 mL

## 2023-01-27 MED ORDER — CHLORHEXIDINE GLUCONATE 0.12 % MT SOLN
15.0000 mL | Freq: Once | OROMUCOSAL | Status: AC
  Administered 2023-01-27: 15 mL via OROMUCOSAL

## 2023-01-27 MED ORDER — MIDAZOLAM HCL 5 MG/5ML IJ SOLN
INTRAMUSCULAR | Status: DC | PRN
  Administered 2023-01-27: 1 mg via INTRAVENOUS

## 2023-01-27 MED ORDER — CEFAZOLIN IN SODIUM CHLORIDE 3-0.9 GM/100ML-% IV SOLN
3.0000 g | INTRAVENOUS | Status: AC
  Administered 2023-01-27: 3 g via INTRAVENOUS
  Filled 2023-01-27: qty 100

## 2023-01-27 MED ORDER — OXYCODONE HCL 5 MG PO TABS: 5.0000 mg | ORAL_TABLET | Freq: Once | ORAL | Status: AC | PRN

## 2023-01-27 MED ORDER — OXYCODONE HCL 5 MG/5ML PO SOLN
5.0000 mg | Freq: Once | ORAL | Status: AC | PRN
  Administered 2023-01-27: 5 mg via ORAL

## 2023-01-27 MED ORDER — ACETAMINOPHEN 160 MG/5ML PO SOLN
325.0000 mg | ORAL | Status: DC | PRN
  Administered 2023-01-27: 650 mg via ORAL

## 2023-01-27 MED ORDER — MEPERIDINE HCL 50 MG/ML IJ SOLN: 6.2500 mg | INTRAMUSCULAR | Status: DC | PRN

## 2023-01-27 MED ORDER — ACETAMINOPHEN 325 MG PO TABS: 325.0000 mg | ORAL_TABLET | ORAL | Status: DC | PRN

## 2023-01-27 SURGICAL SUPPLY — 26 items
BAG URO CATCHER STRL LF (MISCELLANEOUS) ×1 IMPLANT
BASKET LASER NITINOL 1.9FR (BASKET) IMPLANT
BASKET ZERO TIP NITINOL 2.4FR (BASKET) IMPLANT
BSKT STON RTRVL 120 1.9FR (BASKET)
BSKT STON RTRVL ZERO TP 2.4FR (BASKET) ×1
CATH URETERAL DUAL LUMEN 10F (MISCELLANEOUS) IMPLANT
CATH URETL OPEN END 6FR 70 (CATHETERS) ×1 IMPLANT
CLOTH BEACON ORANGE TIMEOUT ST (SAFETY) ×1 IMPLANT
EXTRACTOR STONE 1.7FRX115CM (UROLOGICAL SUPPLIES) IMPLANT
GLOVE BIO SURGEON STRL SZ7.5 (GLOVE) ×1 IMPLANT
GOWN STRL REUS W/ TWL XL LVL3 (GOWN DISPOSABLE) ×1 IMPLANT
GOWN STRL REUS W/TWL XL LVL3 (GOWN DISPOSABLE) ×1
GUIDEWIRE ANG ZIPWIRE 038X150 (WIRE) IMPLANT
GUIDEWIRE STR DUAL SENSOR (WIRE) ×1 IMPLANT
KIT TURNOVER KIT A (KITS) IMPLANT
LASER FIB FLEXIVA PULSE ID 365 (Laser) IMPLANT
MANIFOLD NEPTUNE II (INSTRUMENTS) ×1 IMPLANT
PACK CYSTO (CUSTOM PROCEDURE TRAY) ×1 IMPLANT
SHEATH NAVIGATOR HD 11/13X28 (SHEATH) IMPLANT
SHEATH NAVIGATOR HD 11/13X36 (SHEATH) IMPLANT
SHEATH NAVIGATOR HD 12/14X36 (SHEATH) IMPLANT
STENT URET 6FRX26 CONTOUR (STENTS) IMPLANT
TRACTIP FLEXIVA PULS ID 200XHI (Laser) IMPLANT
TRACTIP FLEXIVA PULSE ID 200 (Laser)
TUBING CONNECTING 10 (TUBING) ×1 IMPLANT
TUBING UROLOGY SET (TUBING) ×1 IMPLANT

## 2023-01-27 NOTE — Anesthesia Preprocedure Evaluation (Addendum)
Anesthesia Evaluation  Patient identified by MRN, date of birth, ID band Patient awake    Reviewed: Allergy & Precautions, H&P , NPO status , Patient's Chart, lab work & pertinent test results  Airway Mallampati: III  TM Distance: >3 FB Neck ROM: Full    Dental no notable dental hx. (+) Teeth Intact, Dental Advisory Given   Pulmonary neg pulmonary ROS, shortness of breath, sleep apnea and Continuous Positive Airway Pressure Ventilation , PE   Pulmonary exam normal breath sounds clear to auscultation       Cardiovascular hypertension, + CAD and +CHF  negative cardio ROS Normal cardiovascular exam(-) dysrhythmias  Rhythm:Regular Rate:Normal     Neuro/Psych negative neurological ROS  negative psych ROS   GI/Hepatic negative GI ROS, Neg liver ROS,,,  Endo/Other  negative endocrine ROS  Morbid obesity  Renal/GU Renal diseasenegative Renal ROS  negative genitourinary   Musculoskeletal negative musculoskeletal ROS (+) Arthritis , Osteoarthritis,    Abdominal  (+) + obese  Peds negative pediatric ROS (+)  Hematology negative hematology ROS (+) Blood dyscrasia, anemia   Anesthesia Other Findings   Reproductive/Obstetrics negative OB ROS                             Anesthesia Physical Anesthesia Plan  ASA: 3  Anesthesia Plan: General   Post-op Pain Management: Minimal or no pain anticipated   Induction: Intravenous  PONV Risk Score and Plan: 3 and Treatment may vary due to age or medical condition, Ondansetron and Dexamethasone  Airway Management Planned: LMA  Additional Equipment: None  Intra-op Plan:   Post-operative Plan:   Informed Consent: I have reviewed the patients History and Physical, chart, labs and discussed the procedure including the risks, benefits and alternatives for the proposed anesthesia with the patient or authorized representative who has indicated his/her  understanding and acceptance.     Dental advisory given  Plan Discussed with: CRNA and Anesthesiologist  Anesthesia Plan Comments:         Anesthesia Quick Evaluation

## 2023-01-27 NOTE — Anesthesia Postprocedure Evaluation (Signed)
Anesthesia Post Note  Patient: Colin Hall.  Procedure(s) Performed: CYSTOSCOPY BILATERAL URETEROSCOPY/HOLMIUM LASER/STENT PLACEMENT (Bilateral: Ureter)     Patient location during evaluation: PACU Anesthesia Type: General Level of consciousness: awake and alert Pain management: pain level controlled Vital Signs Assessment: post-procedure vital signs reviewed and stable Respiratory status: spontaneous breathing, nonlabored ventilation, respiratory function stable and patient connected to nasal cannula oxygen Cardiovascular status: blood pressure returned to baseline and stable Postop Assessment: no apparent nausea or vomiting Anesthetic complications: no   No notable events documented.  Last Vitals:  Vitals:   01/27/23 1200 01/27/23 1215  BP: 119/72 (!) 171/105  Pulse: 80 77  Resp: (!) 23 (!) 25  Temp:    SpO2: 100% 94%    Last Pain:  Vitals:   01/27/23 1215  TempSrc:   PainSc: 0-No pain                 Colin Hall

## 2023-01-27 NOTE — Op Note (Signed)
Operative Note  Preoperative diagnosis:  1.  Right renal calculus  Postoperative diagnosis: 1.  Right renal calculus  Procedure(s): 1.  Cystoscopy with right retrograde pyelogram, right ureteroscopy with laser lithotripsy and stone basketing, ureteral stent placement 2.  Left ureteral stent removal  Surgeon: Modena Slater, MD  Assistants: None  Anesthesia: General  Complications: None immediate  EBL: Minimal  Specimens: 1.  Renal calculus  Drains/Catheters: 1.  6 x 26 double-J ureteral stent on the right  Intraoperative findings: 1.  Normal anterior urethra.  No stricture remaining.  Nonobstructing prostate. 2.  Bladder mucosa with some edema from the stents but no tumors masses or stones. 3.  Removal of left ureteral stent 4.  Right ureteroscopy revealed a large right renal calculus that was fragmented and basket extracted.  Only tiny fragments too small to basket remained.  Retrograde pyelogram revealed no hydronephrosis and no filling defect after treatment of the stone.  Indication: 62 year old male with history of left ureteral calculus and bilateral renal calculi status post left ureteroscopy with laser lithotripsy and ureteral stone extraction and right stent placement presents for the previously mentioned operation.  Description of procedure:  The patient was identified and consent was obtained.  The patient was taken to the operating room and placed in the supine position.  The patient was placed under general anesthesia.  Perioperative antibiotics were administered.  The patient was placed in dorsal lithotomy.  Patient was prepped and draped in a standard sterile fashion and a timeout was performed.  A 21 French rigid cystoscope was advanced into the urethra and into the bladder.  The left stent was grasped and pulled out.  The right stent was grasped and pulled out as well.  I reintroduced the scope and advanced a sensor wire up the right ureter and into the kidney  under fluoroscopic guidance.  A second sensor wire was advanced alongside this up into the kidney and the scope was withdrawn.  1 the wires was secured to the drape as a safety wire and the other wire was used to advance a 12 x 14 ureteral access sheath over the wire under continuous fluoroscopic guidance up to the proximal ureter.  Inner sheath and wire were withdrawn.  Digital ureteroscopy was performed.  There was a single large calculus that was fragmented to smaller fragments followed by basket extraction of many stone fragments.  Once all the stone fragments were extracted, only tiny fragments too small to extract remained.  I shot a retrograde pyelogram through the scope with findings noted above.  I withdrew the scope along with the access sheath visualizing the ureter upon removal.  There were no ureteral calculi and no ureteral injury was seen.  I backloaded the wire onto the rigid cystoscope and advanced that into the bladder followed by routine placement of a 6 x 26 double-J ureteral stent.  Fluoroscopy confirmed proximal placement and direct visualization confirmed a good coil within the bladder.  I drained the bladder and withdrew the scope.  Patient tolerated the procedure well was stable postoperatively.  Plan: Patient will return in about 1 week for stent removal.

## 2023-01-27 NOTE — H&P (Signed)
CC/HPI: CC: lower urinary tract symptoms.  HPI:  62 year old male who is morbidly obese, 520 pounds. For the past 3-4 months, he has had a weaker stream, nocturia 2-3 times a night, some intermittency. He has no hesitancy. He denies hematuria or dysuria. He does have a history of gross hematuria and is status post negative workup except for the finding of bilateral renal calculi. He has not passed any stones in the interval. He does not have any pain.   03/18/2019  Patient continues on Flomax. He has been doing well with this. Urinary stream has improved. Less nocturia. He does however continue to have significant frequency and urgency with occasional urge urinary incontinence.   06/17/2019  Patient continues on Flomax. We were unable to get him in to the beta 3 agonist trial. As expected, his urinary urgency and occasional urge urinary incontinence is unchanged. No other complaints.   09/14/2019  In the interval, the patient tried Myrbetriq. Symptoms have improved. He has minimal incontinence. Still has some urgency but he is able to make it to the bathroom. He is pleased with the results. He has persistent microscopic hematuria. He had a negative hematuria workup by me in the past.   09/27/2020  Patient had an episode of gross hematuria a week ago. He has had a negative hematuria workup in the past. He declines repeat cystoscopy at this time. He is agreeable to a cytology. His insurance unfortunately notified him that they will not pay for Myrbetriq anymore. Therefore, he would like to try an anticholinergic.   09/20/2021  Patient been doing very well with the trospium. Minimal frequency and urgency. Nocturia x1. Continues on tamsulosin as well. He has no complaints in this regard. Microscopic hematuria is stable. Declines repeat hematuria work-up. Does recall a couple of episodes of gross hematuria in the interval. He is on Xarelto.   11/05/2022  Patient continues to do well on trospium and  tamsulosin. He notes a little bit of worsening of symptoms but not enough to change management. Has stable microscopic hematuria but has had a couple of episodes of gross hematuria. Has been several years since last hematuria workup. He is okay with repeating it hematuria workup at this time..   12/19/2022  Patient underwent a CT hematuria protocol that revealed a 13 mm left proximal ureteral calculus with left hydronephrosis with renal cortical thinning likely representing a chronic process. He also has a right renal calculus that was 17 mm. He is not having any left-sided flank pain.   01/24/2023:  Patient presents for trial of void status post urethral dilation of bulbar stricture, bilateral ureteroscopy with bilateral stents, and left ureteral and renal lithotripsy on 7/15. His stents are not tethered. He discontinued Xarelto yesterday due to upcoming ureteroscopy procedure on Monday, and maintains tamsulosin 0.4 mg. Since last seen, patient has been tolerating his catheter with mild discomfort due to urethral irritation. It continues to drain appropriately, returning bloody urine without clots. His gross hematuria has varied from grade 2-0. Patient otherwise denies suprapubic discomfort, flank pain, fever/chills, nausea/vomiting.     ALLERGIES: None   MEDICATIONS: Allopurinol 100 mg tablet  Metoprolol Tartrate 25 mg tablet  Tamsulosin Hcl 0.4 mg capsule 1 capsule PO Daily  Trospium Chloride Er 60 mg capsule, ext release 24 hr 1 capsule PO Daily  Diltiazem 24Hr Er 120 mg capsule, extended release 24hr  Furosemide 20 mg tablet  Latanoprost  Losartan Potassium 50 mg tablet  Multivitamin  Rosuvastatin Calcium 10 mg tablet  Vitamin B12  Xarelto 20 mg tablet     GU PSH: Cystoscopy - 12/19/2022, 2019     NON-GU PSH: Colonoscopy Gastric sleeve Tonsillectomy Visit Complexity (formerly GPC1X) - 11/05/2022     GU PMH: Bulbar urethral stricture - 12/19/2022 Gross hematuria - 12/19/2022, -  11/05/2022, - 09/20/2021, - 2022, - 2019 Renal and ureteral calculus - 12/19/2022 Urinary Urgency - 12/19/2022, - 11/05/2022, - 09/20/2021, - 2022, - 2020 BPH w/LUTS - 11/05/2022, - 09/20/2021, - 2022, - 2020 Urge incontinence - 11/05/2022, - 2019 Microscopic hematuria - 2020 Weak Urinary Stream - 2020 Renal calculus - 2019    NON-GU PMH: Arthritis Atrial Fibrillation Gout Hypertension Other heart failure Pulmonary Embolism, History Sleep Apnea    Immunizations: None   FAMILY HISTORY: Alzheimer's Disease - Mother Death of family member - Mother, Father Diabetes - Father Glaucoma - Mother Kidney Stones - Mother Parkinson's Disease - Mother   SOCIAL HISTORY: Marital Status: Single Preferred Language: English; Ethnicity: Not Hispanic Or Latino; Race: Black or African American Current Smoking Status: Patient has never smoked.   Tobacco Use Assessment Completed: Used Tobacco in last 30 days? Does not use smokeless tobacco. Has never drank.  Drinks 1 caffeinated drink per day.    VITAL SIGNS:      01/24/2023 11:10 AM  Weight 460 lb / 208.65 kg  Height 68 in / 172.72 cm  BP 132/88 mmHg  Pulse 71 /min  Temperature 97.1 F / 36.1 C  BMI 69.9 kg/m   MULTI-SYSTEM PHYSICAL EXAMINATION:    Constitutional: Morbidly obese. No physical deformities. Normally developed. Good grooming.   Respiratory: No labored breathing, no use of accessory muscles. Lungs clear to auscultation bilaterally. Diminished lung sounds in bases. No wheezing, Rales.  Cardiovascular: Normal temperature, normal extremity pulses, no swelling, no varicosities. Regular rate and rhythm. No murmurs, gallops.  Skin: No paleness, no jaundice, no cyanosis. No lesion, no ulcer, no rash.  Neurologic / Psychiatric: Oriented to time, oriented to place, oriented to person. No depression, no anxiety, no agitation.  Gastrointestinal: Morbidly obese abdomen severely limiting abdominal examination.     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 We discussed with patient the need for increased hydration with presence of gross hematuria in order to mitigate clot burden over the weekend. Patient is concerned for this, and ultimately decided to defer trial of void. We think this is reasonable. Foley catheter was left intact, and patient was provided a catheter plug for ease of showering.   Maintain upcoming procedure on Monday. Patient knows to present to the emergency department over the weekend should he have any concerns  with obstructive clots or other concerns with catheter. Continue on tamsulosin 0.4 mg, maintain holding Xarelto. Patient voiced understanding and is amenable to this plan.        Next Appointment:      Next Appointment: 01/27/2023 09:30 AM    Appointment Type: Surgery     Location: [facility] [phone]    Provider: Modena Slater, III, M.D.    Reason for Visit: OP WL CYSTO BIL URS HLL STENT      Signed by Buzzy Han, PA-C on 01/24/23 at 11:46 AM (EDT

## 2023-01-27 NOTE — Anesthesia Procedure Notes (Signed)
Procedure Name: LMA Insertion Date/Time: 01/27/2023 9:36 AM  Performed by: Bishop Limbo, CRNAPre-anesthesia Checklist: Patient identified, Emergency Drugs available, Suction available and Patient being monitored Patient Re-evaluated:Patient Re-evaluated prior to induction Oxygen Delivery Method: Circle System Utilized Preoxygenation: Pre-oxygenation with 100% oxygen Induction Type: IV induction Ventilation: Mask ventilation without difficulty LMA: LMA inserted LMA Size: 5.0 Number of attempts: 1 Airway Equipment and Method: Bite block Placement Confirmation: positive ETCO2 Tube secured with: Tape Dental Injury: Teeth and Oropharynx as per pre-operative assessment

## 2023-01-27 NOTE — Transfer of Care (Signed)
Immediate Anesthesia Transfer of Care Note  Patient: Colin Hall.  Procedure(s) Performed: CYSTOSCOPY BILATERAL URETEROSCOPY/HOLMIUM LASER/STENT PLACEMENT (Bilateral: Ureter)  Patient Location: PACU  Anesthesia Type:General  Level of Consciousness: awake, alert , oriented, and patient cooperative  Airway & Oxygen Therapy: Patient Spontanous Breathing and Patient connected to face mask oxygen  Post-op Assessment: Report given to RN and Post -op Vital signs reviewed and stable  Post vital signs: Reviewed and stable  Last Vitals:  Vitals Value Taken Time  BP 147/84 01/27/23 1153  Temp 36.2 C 01/27/23 1153  Pulse 78 01/27/23 1155  Resp 29 01/27/23 1156  SpO2 86 % 01/27/23 1155  Vitals shown include unfiled device data.  Last Pain:  Vitals:   01/27/23 1153  TempSrc: Axillary  PainSc: 0-No pain         Complications: No notable events documented.

## 2023-01-27 NOTE — Discharge Instructions (Addendum)

## 2023-01-28 ENCOUNTER — Encounter (HOSPITAL_COMMUNITY): Payer: Self-pay | Admitting: Urology

## 2023-02-13 ENCOUNTER — Encounter: Payer: Self-pay | Admitting: Podiatry

## 2023-02-13 ENCOUNTER — Ambulatory Visit: Payer: BC Managed Care – PPO | Admitting: Podiatry

## 2023-02-13 DIAGNOSIS — L84 Corns and callosities: Secondary | ICD-10-CM | POA: Diagnosis not present

## 2023-02-13 DIAGNOSIS — B351 Tinea unguium: Secondary | ICD-10-CM | POA: Diagnosis not present

## 2023-02-13 DIAGNOSIS — G609 Hereditary and idiopathic neuropathy, unspecified: Secondary | ICD-10-CM | POA: Diagnosis not present

## 2023-02-13 DIAGNOSIS — M79675 Pain in left toe(s): Secondary | ICD-10-CM

## 2023-02-13 DIAGNOSIS — M79674 Pain in right toe(s): Secondary | ICD-10-CM

## 2023-02-13 DIAGNOSIS — I739 Peripheral vascular disease, unspecified: Secondary | ICD-10-CM | POA: Diagnosis not present

## 2023-02-13 NOTE — Progress Notes (Signed)
  Subjective:  Patient ID: Colin Ben., male    DOB: Apr 23, 1961,  MRN: [id number]  Colin Ben. presents to clinic today for preulcerative lesion(s) of both feet and painful mycotic toenails that limit ambulation. Painful toenails interfere with ambulation. Aggravating factors include wearing enclosed shoe gear. Pain is relieved with periodic professional debridement. Painful preulcerative lesion(s) is/are aggravated when weightbearing with and without shoegear. Pain is relieved with periodic professional debridement.  Chief Complaint  Patient presents with   Nail Problem    RFC,Referring Provider Gordan Payment., MD,lov:08/24      New problem(s): None.   PCP is Gordan Payment., MD.  No Known Allergies  Review of Systems: Negative except as noted in the HPI.  Objective: No changes noted in today's physical examination. There were no vitals filed for this visit.  Colin Ben. is a pleasant 63 y.o. male morbidly obese in NAD. AAO x 3.  Vascular Examination: CFT <4 seconds b/l. DP pulses diminished b/l. PT pulses diminished b/l. Digital hair absent. Skin temperature gradient warm to cool b/l. No ischemia or gangrene. No cyanosis or clubbing noted b/l. Lymphedema present BLE.   Neurological Examination: Sensation grossly intact b/l with 10 gram monofilament.Vibratory sensation diminished b/l.  Dermatological Examination: No open wounds. No interdigital macerations.   Toenails 1-5 b/l thick, discolored, elongated with subungual debris and pain on dorsal palpation.   Hyperkeratotic lesion(s) submet head 5 right foot.  No erythema, no edema, no drainage, no fluctuance. Preulcerative lesion noted submet head 5 left foot. There is visible subdermal hemorrhage. There is no surrounding erythema, no edema, no drainage, no odor, no fluctuance. Skin b/l lower extremities noted to be thickened and brawny consistent with lymphedema.  Musculoskeletal Examination: Muscle strength  5/5 to all lower extremity muscle groups bilaterally. Pes planus deformity noted bilateral LE.  Radiographs: None  Assessment/Plan: 1. Pain due to onychomycosis of toenails of both feet   2. Pre-ulcerative calluses   3. Idiopathic peripheral neuropathy   4. PVD (peripheral vascular disease) (HCC)     -Consent given for treatment as described below: -Examined patient. -Patient to continue soft, supportive shoe gear daily. -Mycotic toenails 1-5 bilaterally were debrided in length and girth with sterile nail nippers and dremel without incident. -Callus(es) submet head 5 right foot pared utilizing sterile scalpel blade without complication or incident. Total number debrided =1. -Preulcerative lesion pared submet head 5 left foot utilizing sterile scalpel blade. Total number pared=1. -Patient/POA to call should there be question/concern in the interim.   Return in about 9 weeks (around 04/17/2023).  Freddie Breech, DPM

## 2023-03-19 ENCOUNTER — Other Ambulatory Visit (HOSPITAL_COMMUNITY): Payer: Self-pay | Admitting: Nurse Practitioner

## 2023-03-19 DIAGNOSIS — N202 Calculus of kidney with calculus of ureter: Secondary | ICD-10-CM

## 2023-04-03 ENCOUNTER — Ambulatory Visit (HOSPITAL_COMMUNITY)
Admission: RE | Admit: 2023-04-03 | Discharge: 2023-04-03 | Disposition: A | Discharge status: Home / Self Care | Payer: BC Managed Care – PPO | Source: Ambulatory Visit | Attending: Nurse Practitioner | Admitting: Nurse Practitioner

## 2023-04-03 DIAGNOSIS — N202 Calculus of kidney with calculus of ureter: Secondary | ICD-10-CM

## 2023-04-17 ENCOUNTER — Encounter: Payer: Self-pay | Admitting: Podiatry

## 2023-04-17 ENCOUNTER — Ambulatory Visit: Payer: BC Managed Care – PPO | Admitting: Podiatry

## 2023-04-17 DIAGNOSIS — G609 Hereditary and idiopathic neuropathy, unspecified: Secondary | ICD-10-CM | POA: Diagnosis not present

## 2023-04-17 DIAGNOSIS — B351 Tinea unguium: Secondary | ICD-10-CM

## 2023-04-17 DIAGNOSIS — M79674 Pain in right toe(s): Secondary | ICD-10-CM | POA: Diagnosis not present

## 2023-04-17 DIAGNOSIS — M79675 Pain in left toe(s): Secondary | ICD-10-CM

## 2023-04-17 DIAGNOSIS — I739 Peripheral vascular disease, unspecified: Secondary | ICD-10-CM | POA: Diagnosis not present

## 2023-04-17 DIAGNOSIS — L84 Corns and callosities: Secondary | ICD-10-CM | POA: Diagnosis not present

## 2023-04-17 NOTE — Progress Notes (Signed)
Subjective:  Patient ID: Colin Ben., male    DOB: [birth year],  MRN: [id number]  62 y.o. male presents to clinic with  at risk foot care. Patient has h/o PAD and neuropathy and preulcerative lesion(s) left foot and painful mycotic toenails that limit ambulation. Painful toenails interfere with ambulation. Aggravating factors include wearing enclosed shoe gear. Pain is relieved with periodic professional debridement. Painful preulcerative lesion(s) is/are aggravated when weightbearing with and without shoegear. Pain is relieved with periodic professional debridement.   New problem(s): None   PCP is Gordan Payment., MD.  No Known Allergies  Review of Systems: Negative except as noted in the HPI.   Objective:  Colin Sonner. is a pleasant 62 y.o. male morbidly obese in NAD. AAO x 3.  Vascular Examination: CFT <4 seconds b/l. DP pulses diminished b/l. PT pulses diminished b/l. Digital hair absent. Skin temperature gradient warm to cool b/l. No ischemia or gangrene. No cyanosis or clubbing noted b/l. Lymphedema present BLE.   Neurological Examination: Sensation grossly intact b/l with 10 gram monofilament.Vibratory sensation diminished b/l.  Dermatological Examination: No open wounds. No interdigital macerations.   Toenails 1-5 b/l thick, discolored, elongated with subungual debris and pain on dorsal palpation.   Hyperkeratotic lesion(s) submet head 5 right foot.  No erythema, no edema, no drainage, no fluctuance.   Preulcerative lesion noted submet head 5 left foot. There is visible subdermal hemorrhage. There is no surrounding erythema, no edema, no drainage, no odor, no fluctuance. Skin b/l lower extremities noted to be thickened and brawny consistent with lymphedema.  Musculoskeletal Examination: Muscle strength 5/5 to all lower extremity muscle groups bilaterally. Pes planus deformity noted bilateral LE.  Radiographs: None  Last A1c:       No data to display           Assessment:   1. Pain due to onychomycosis of toenails of both feet   2. Pre-ulcerative calluses   3. Idiopathic peripheral neuropathy   4. PVD (peripheral vascular disease) (HCC)    Plan:  -Patient was evaluated and treated. All patient's and/or POA's questions/concerns answered on today's visit. -Continue supportive shoe gear daily. -Toenails 1-5 b/l were debrided in length and girth with sterile nail nippers and dremel without iatrogenic bleeding.  -Callus(es) submet head 5 right foot pared utilizing sterile scalpel blade without complication or incident. Total number debrided =1. -Preulcerative lesion pared submet head 5 left foot utilizing sterile scalpel blade. Total number pared=1. -Patient/POA to call should there be question/concern in the interim.  Return in about 9 weeks (around 06/19/2023).  Colin Hall, DPM

## 2023-04-20 DIAGNOSIS — M189 Osteoarthritis of first carpometacarpal joint, unspecified: Secondary | ICD-10-CM

## 2023-04-20 HISTORY — DX: Osteoarthritis of first carpometacarpal joint, unspecified: M18.9

## 2023-06-19 ENCOUNTER — Ambulatory Visit: Payer: BC Managed Care – PPO | Admitting: Podiatry

## 2023-06-19 ENCOUNTER — Encounter: Payer: Self-pay | Admitting: Podiatry

## 2023-06-19 DIAGNOSIS — M79674 Pain in right toe(s): Secondary | ICD-10-CM | POA: Diagnosis not present

## 2023-06-19 DIAGNOSIS — I739 Peripheral vascular disease, unspecified: Secondary | ICD-10-CM

## 2023-06-19 DIAGNOSIS — M79675 Pain in left toe(s): Secondary | ICD-10-CM | POA: Diagnosis not present

## 2023-06-19 DIAGNOSIS — L84 Corns and callosities: Secondary | ICD-10-CM

## 2023-06-19 DIAGNOSIS — B351 Tinea unguium: Secondary | ICD-10-CM

## 2023-06-29 NOTE — Progress Notes (Signed)
°  Subjective:  Patient ID: Colin Ben., male    DOB: May 14, 1961,  MRN: [id number]  62 y.o. male presents with  at risk foot care. Patient has h/o PAD and preulcerative lesion(s) of both feet and painful mycotic toenails that limit ambulation. Painful toenails interfere with ambulation. Aggravating factors include wearing enclosed shoe gear. Pain is relieved with periodic professional debridement. Painful preulcerative lesion(s) is/are aggravated when weightbearing with and without shoegear. Pain is relieved with periodic professional debridement.Marland Kitchen    PCP: Gordan Payment., MD.  New problem(s): None.   Review of Systems: Negative except as noted in the HPI.   No Known Allergies  Objective:  There were no vitals filed for this visit. Constitutional Patient is a pleasant 62 y.o. African American male morbidly obese in NAD. AAO x 3.  Vascular Capillary fill time to digits <4 seconds.  DP/PT pulse(s) are nonpalpable b/l lower extremities. Pedal hair absent b/l. Lower extremity skin temperature gradient warm to cool b/l. No pain with calf compression b/l. No cyanosis or clubbing noted. No ischemia nor gangrene noted b/l. Lymphedema present BLE.  Neurologic Protective sensation intact 5/5 intact bilaterally with 10g monofilament b/l. Vibratory sensation intact b/l. No clonus b/l. Protective sensation intact 5/5 intact bilaterally with 10g monofilament b/l. Vibratory sensation diminished b/l.  Dermatologic Pedal skin is thin, shiny and atrophic b/l.  No open wounds b/l lower extremities. No interdigital macerations b/l lower extremities. Toenails 1-5 b/l elongated, discolored, dystrophic, thickened, crumbly with subungual debris and tenderness to dorsal palpation. Hyperkeratotic lesion(s) right foot.  No erythema, no edema, no drainage, no fluctuance. Preulcerative lesion noted left foot. There is visible subdermal hemorrhage. There is no surrounding erythema, no edema, no drainage, no odor, no  fluctuance.  Orthopedic: Normal muscle strength 5/5 to all lower extremity muscle groups bilaterally. Pes planus deformity noted bilateral LE.   Last HgA1c:      No data to display           Assessment:   1. Pain due to onychomycosis of toenails of both feet   2. Pre-ulcerative calluses   3. PVD (peripheral vascular disease) (HCC)    Plan:  Patient was evaluated and treated and all questions answered. Consent given for treatment as described below: -Mycotic toenails 1-5 bilaterally were debrided in length and girth with sterile nail nippers and dremel without incident. -Callus(es) submet head 5 right foot pared utilizing sterile scalpel blade without complication or incident. Total number debrided =1. -Preulcerative lesion pared submet head 5 left foot utilizing sterile scalpel blade. Total number pared=1. -Patient/POA to call should there be question/concern in the interim.  Return in about 9 weeks (around 08/21/2023).  Freddie Breech, DPM      [facility] LOCATION: [address] [place] [address]                   Office [phone]   [facility] LOCATION: [address] [place], [place] [address] Office [phone] Freddie Breech, DPM

## 2023-07-14 ENCOUNTER — Encounter: Payer: Self-pay | Admitting: Cardiology

## 2023-07-14 DIAGNOSIS — R0600 Dyspnea, unspecified: Secondary | ICD-10-CM | POA: Insufficient documentation

## 2023-07-14 NOTE — Progress Notes (Signed)
 Cardiology Office Note:    Date:  07/15/2023   ID:  Colin Hall Pamelia Mickey., DOB May 13, 1961, MRN [id number]  PCP:  Thurmond Cathlyn LABOR., MD  Cardiologist:  Redell Leiter, MD    Referring MD: Thurmond Cathlyn LABOR., MD    ASSESSMENT:    1. Hypertensive heart disease with chronic diastolic congestive heart failure (HCC)   2. Chronic anticoagulation   3. PAF (paroxysmal atrial fibrillation) (HCC)   4. Mixed hyperlipidemia   5. OSA on CPAP    PLAN:    In order of problems listed above:  Quinterius continues to do well with his hypertensive heart disease and heart failure currently compensated on his current loop and distal diuretic Continue his anticoagulation with unprovoked pulmonary embolism paroxysmal atrial fibrillation Stable continue current treatment beta-blocker calcium  channel blocker No change   Next appointment: 6 months   Medication Adjustments/Labs and Tests Ordered: Current medicines are reviewed at length with the patient today.  Concerns regarding medicines are outlined above.  No orders of the defined types were placed in this encounter.  No orders of the defined types were placed in this encounter.    History of Present Illness:    Colin Hall. is a 63 y.o. male with a hx of hypertensive heart disease with chronic diastolic heart failure unprovoked pulmonary embolism and paroxysmal atrial fibrillation on long-term anticoagulation obstructive sleep apnea and hyper lipidemia last seen 05/21/2022.  Compliance with diet, lifestyle and medications: Yes  Labs 06/17/2023: CBC with a hemoglobin mildly diminished 11.7 platelets normal 178,000 CMP normal potassium 3.9 creatinine 0.89 GFR greater than 90 cc/min cholesterol 126 LDL 52 non-HDL cholesterol 65  I reviewed his history he had a slip in the bathtub not syncope when he fell and had prolonged immobilization Fortunately he had no severe injury or rhabdomyo lysis Since then he has done well no shortness of breath orthopnea  chest pain palpitation or syncope No recurrent falls I did strongly encourage him to get an Apple Watch for safety he lives alone Past Medical History:  Diagnosis Date   Acute diastolic CHF (congestive heart failure) (HCC) 07/21/2014   Adult BMI >=70 kg/sq m (HCC) 12/25/2015   Anemia, chronic disease 12/25/2015   Arthritis    back (07/20/2014)   Atrial fibrillation with rapid ventricular response (HCC) 07/20/2014   Bilateral pleural effusion 07/21/2014   Cellulitis 09/19/2016   Cellulitis of left lower extremity 09/19/2016   Chronic anemia    Chronic anticoagulation 01/14/2015   Chronic diastolic heart failure (HCC) 01/14/2015   Chronic edema 12/25/2015   Chronic gout of multiple sites 12/25/2015   Coronary artery calcification seen on CT scan 01/14/2015   Overview:  Dense calcifications on CT and normal coranaries on catheterization Jan 2016   DDD (degenerative disc disease), lumbosacral 12/25/2015   Demand ischemia (HCC) 07/20/2014   Dyspnea    False positive serological test for hepatitis C 01/16/2017   Folic acid deficiency 12/25/2015   Glaucoma    Glaucoma 12/25/2015   Gout    Hematuria 01/16/2016   Overview:  [place]   High risk medication use 12/25/2015   HTN (hypertension) 07/20/2014   Hypertension    Hypertensive heart failure (HCC) 07/20/2014   Iron deficiency 12/02/2019   Kidney cyst, acquired 01/16/2017   Overview:  Complex. Felt benign by [place]   Kidney stone 12/25/2015   Kidney stones    multiple   Malaise and fatigue 12/25/2015   Mild CAD 07/21/2014   Mixed hyperlipidemia 12/25/2015  Morbid obesity (HCC) 08/29/2010   pt is 500 lbs   Morbid obesity with BMI of 60.0-69.9, adult (HCC) 12/25/2015   New onset atrial fibrillation (HCC) 07/19/2014   w/RVR   OSA on CPAP    Osteoarthritis of basilar joint of thumb 04/20/2023   Osteoarthritis of multiple joints 12/25/2015   PAF (paroxysmal atrial fibrillation) (HCC) 01/14/2015   Pre-diabetes     Prediabetes 12/25/2015   Pulmonary emboli (HCC) 07/21/2014   UTI (urinary tract infection) 07/20/2014   Vitamin B12 deficiency 12/25/2015    Current Medications: Current Meds  Medication Sig   acetaminophen  (TYLENOL ) 650 MG CR tablet Take 1,300 mg by mouth every 8 (eight) hours as needed for pain.   allopurinol  (ZYLOPRIM ) 100 MG tablet Take 200 mg by mouth daily.   diltiazem  (CARDIZEM  CD) 120 MG 24 hr capsule Take 120 mg by mouth daily.   furosemide  (LASIX ) 20 MG tablet Take 20 mg by mouth daily as needed for edema.   latanoprost  (XALATAN ) 0.005 % ophthalmic solution Place 1 drop into both eyes nightly.   losartan (COZAAR) 100 MG tablet Take 100 mg by mouth daily.   metoprolol  tartrate (LOPRESSOR ) 25 MG tablet Take 1 tablet (25 mg total) by mouth 2 (two) times daily.   Multiple Vitamin (MULTI-VITAMIN DAILY) TABS Take 1 tablet by mouth daily.   mupirocin ointment (BACTROBAN) 2 % Apply 1 application  topically daily as needed (boils/ulcers).   rivaroxaban  (XARELTO ) 20 MG TABS tablet Take 20 mg by mouth every evening.   rosuvastatin  (CRESTOR ) 10 MG tablet Take 10 mg by mouth every evening.   spironolactone (ALDACTONE) 25 MG tablet Take 1 tablet by mouth daily.   tamsulosin  (FLOMAX ) 0.4 MG CAPS capsule Take 0.4 mg by mouth daily.   Trospium Chloride 60 MG CP24 Take 1 capsule by mouth daily.   Urea  41 % CREA Apply to calloused skin once daily. (Patient taking differently: Apply 1 Application topically daily as needed (calluses).)   vitamin B-12 (CYANOCOBALAMIN) 1000 MCG tablet Take 1,000 mcg by mouth daily.      EKGs/Labs/Other Studies Reviewed:    The following studies were reviewed today:          Recent Labs: 01/27/2023: BUN 26; Creatinine, Ser 1.30; Hemoglobin 13.2; Platelets 183; Potassium 4.1; Sodium 136  Recent Lipid Panel    Component Value Date/Time   CHOL 114 07/22/2014 0330   TRIG 43 07/22/2014 0330   HDL 30 (L) 07/22/2014 0330   CHOLHDL 3.8 07/22/2014 0330   VLDL 9  07/22/2014 0330   LDLCALC 75 07/22/2014 0330    Physical Exam:    VS:  BP 132/84   Pulse 70   Ht 5' 9 (1.753 m)   Wt (!) 457 lb 6.4 oz (207.5 kg)   SpO2 97%   BMI 67.55 kg/m     Wt Readings from Last 3 Encounters:  07/15/23 (!) 457 lb 6.4 oz (207.5 kg)  01/27/23 (!) 458 lb (207.7 kg)  01/13/23 (!) 458 lb (207.7 kg)     GEN:  Well nourished, well developed in no acute distress HEENT: Normal NECK: No JVD; No carotid bruits LYMPHATICS: No lymphadenopathy CARDIAC: Irregular rate and rhythm variable first heart sound RRR, no murmurs, rubs, gallops RESPIRATORY:  Clear to auscultation without rales, wheezing or rhonchi  ABDOMEN: Soft, non-tender, non-distended MUSCULOSKELETAL:  No edema; No deformity  SKIN: Warm and dry NEUROLOGIC:  Alert and oriented x 3 PSYCHIATRIC:  Normal affect    Signed, Redell Leiter, MD  07/15/2023  2:51 PM    [facility]

## 2023-07-15 ENCOUNTER — Ambulatory Visit: Payer: 59 | Attending: Cardiology | Admitting: Cardiology

## 2023-07-15 ENCOUNTER — Encounter: Payer: Self-pay | Admitting: Cardiology

## 2023-07-15 VITALS — BP 132/84 | HR 70 | Ht 69.0 in | Wt >= 6400 oz

## 2023-07-15 DIAGNOSIS — I110 Hypertensive heart disease with heart failure: Secondary | ICD-10-CM

## 2023-07-15 DIAGNOSIS — E782 Mixed hyperlipidemia: Secondary | ICD-10-CM | POA: Diagnosis not present

## 2023-07-15 DIAGNOSIS — Z7901 Long term (current) use of anticoagulants: Secondary | ICD-10-CM | POA: Diagnosis not present

## 2023-07-15 DIAGNOSIS — I480 Paroxysmal atrial fibrillation: Secondary | ICD-10-CM

## 2023-07-15 DIAGNOSIS — I5032 Chronic diastolic (congestive) heart failure: Secondary | ICD-10-CM

## 2023-07-15 DIAGNOSIS — G4733 Obstructive sleep apnea (adult) (pediatric): Secondary | ICD-10-CM

## 2023-07-15 NOTE — Patient Instructions (Signed)
 Medication Instructions:  Your physician recommends that you continue on your current medications as directed. Please refer to the Current Medication list given to you today.  *If you need a refill on your cardiac medications before your next appointment, please call your pharmacy*   Lab Work: None If you have labs (blood work) drawn today and your tests are completely normal, you will receive your results only by: MyChart Message (if you have MyChart) OR A paper copy in the mail If you have any lab test that is abnormal or we need to change your treatment, we will call you to review the results.   Testing/Procedures: None   Follow-Up: At [facility], you and your health needs are our priority.  As part of our continuing mission to provide you with exceptional heart care, we have created designated Provider Care Teams.  These Care Teams include your primary Cardiologist (physician) and Advanced Practice Providers (APPs -  Physician Assistants and Nurse Practitioners) who all work together to provide you with the care you need, when you need it.  We recommend signing up for the patient portal called MyChart.  Sign up information is provided on this After Visit Summary.  MyChart is used to connect with patients for Virtual Visits (Telemedicine).  Patients are able to view lab/test results, encounter notes, upcoming appointments, etc.  Non-urgent messages can be sent to your provider as well.   To learn more about what you can do with MyChart, go to [email or web address].    Your next appointment:   6 month(s)  Provider:   Delon Hoover NP  Other Instructions Get an Applewatch

## 2023-08-21 ENCOUNTER — Ambulatory Visit: Payer: 59 | Admitting: Podiatry

## 2023-08-21 ENCOUNTER — Encounter: Payer: Self-pay | Admitting: Podiatry

## 2023-08-21 DIAGNOSIS — I739 Peripheral vascular disease, unspecified: Secondary | ICD-10-CM | POA: Diagnosis not present

## 2023-08-21 DIAGNOSIS — M79675 Pain in left toe(s): Secondary | ICD-10-CM

## 2023-08-21 DIAGNOSIS — L84 Corns and callosities: Secondary | ICD-10-CM | POA: Diagnosis not present

## 2023-08-21 DIAGNOSIS — B351 Tinea unguium: Secondary | ICD-10-CM | POA: Diagnosis not present

## 2023-08-21 DIAGNOSIS — G609 Hereditary and idiopathic neuropathy, unspecified: Secondary | ICD-10-CM

## 2023-08-21 DIAGNOSIS — M79674 Pain in right toe(s): Secondary | ICD-10-CM | POA: Diagnosis not present

## 2023-08-21 NOTE — Progress Notes (Addendum)
  Subjective:  Patient ID: Colin Ben., male    DOB: January 16, 1961,  MRN: [id number]  Colin Ben. presents to clinic today for at risk foot care. Patient has h/o PAD and neuropathy and preulcerative lesion(s) of both feet and painful mycotic toenails that limit ambulation. Painful toenails interfere with ambulation. Aggravating factors include wearing enclosed shoe gear. Pain is relieved with periodic professional debridement. Painful preulcerative lesion(s) is/are aggravated when weightbearing with and without shoegear. Pain is relieved with periodic professional debridement.  Chief Complaint  Patient presents with   Nail Problem    "Trim my nails."   Callouses    "Address the callus."   New problem(s): None.   PCP is Gordan Payment., MD.  No Known Allergies  Review of Systems: Negative except as noted in the HPI.  Objective: No changes noted in today's physical examination. There were no vitals filed for this visit. Colin Ben. is a pleasant 63 y.o. male morbidly obese in NAD. AAO x 3.  Vascular Examination: CFT <4 seconds b/l. DP pulses diminished b/l. PT pulses diminished b/l. Digital hair absent. Skin temperature gradient warm to cool b/l. No ischemia or gangrene. No cyanosis or clubbing noted b/l. Lymphedema present BLE.   Neurological Examination: Sensation grossly intact b/l with 10 gram monofilament.Vibratory sensation diminished b/l.  Dermatological Examination: No open wounds. No interdigital macerations.   Toenails 1-5 b/l thick, discolored, elongated with subungual debris and pain on dorsal palpation.   Hyperkeratotic lesion(s) submet head 5 right foot.  No erythema, no edema, no drainage, no fluctuance.   Preulcerative lesion noted submet head 5 left foot. There is visible subdermal hemorrhage. There is no surrounding erythema, no edema, no drainage, no odor, no fluctuance. Skin b/l lower extremities noted to be thickened and brawny consistent with  lymphedema.  Musculoskeletal Examination: Muscle strength 5/5 to all lower extremity muscle groups bilaterally. Pes planus deformity noted bilateral LE.  Radiographs: None  Assessment/Plan: 1. Pain due to onychomycosis of toenails of both feet   2. Pre-ulcerative calluses   3. PVD (peripheral vascular disease) (HCC)   4. Idiopathic peripheral neuropathy     -Consent given for treatment as described below: -Examined patient. -Toenails 1-5 b/l were debrided in length and girth with sterile nail nippers and dremel without iatrogenic bleeding.  -Callus(es) submet head 5 right foot pared utilizing sterile scalpel blade without complication or incident. Total number debrided =1. -Preulcerative lesion pared submet head 5 left foot utilizing sterile scalpel blade. Total number pared=1. Offloaded left sneaker insert with dancer's pad. Patient to get new pair of [employer] and we agreed he would go to [employer] in [place] for assistance. -Patient/POA to call should there be question/concern in the interim.   Return in about 9 weeks (around 10/23/2023).  Freddie Breech, DPM      [place] LOCATION: [address], [place] [address]                   Office [phone]   [facility] LOCATION: [address] [place], [place] [address] Office [phone]

## 2023-10-27 ENCOUNTER — Ambulatory Visit: Payer: BC Managed Care – PPO | Admitting: Podiatry

## 2023-10-27 ENCOUNTER — Encounter: Payer: Self-pay | Admitting: Podiatry

## 2023-10-27 VITALS — Ht 69.0 in | Wt >= 6400 oz

## 2023-10-27 DIAGNOSIS — M79674 Pain in right toe(s): Secondary | ICD-10-CM

## 2023-10-27 DIAGNOSIS — L84 Corns and callosities: Secondary | ICD-10-CM

## 2023-10-27 DIAGNOSIS — I739 Peripheral vascular disease, unspecified: Secondary | ICD-10-CM | POA: Diagnosis not present

## 2023-10-27 DIAGNOSIS — B351 Tinea unguium: Secondary | ICD-10-CM | POA: Diagnosis not present

## 2023-10-27 DIAGNOSIS — G609 Hereditary and idiopathic neuropathy, unspecified: Secondary | ICD-10-CM | POA: Diagnosis not present

## 2023-10-27 DIAGNOSIS — M79675 Pain in left toe(s): Secondary | ICD-10-CM

## 2023-11-02 ENCOUNTER — Encounter: Payer: Self-pay | Admitting: Podiatry

## 2023-11-02 NOTE — Progress Notes (Signed)
  Subjective:  Patient ID: Colin Point., male    DOB: 04-07-1961,  MRN: [id number]  63 y.o. male presents at risk foot care. Patient has h/o PAD and neuropathy and preulcerative lesion(s) b/l lower extremities left >right and painful mycotic toenails that limit ambulation. Painful toenails interfere with ambulation. Aggravating factors include wearing enclosed shoe gear. Pain is relieved with periodic professional debridement. Painful preulcerative lesion(s) is/are aggravated when weightbearing with and without shoegear. Pain is relieved with periodic professional debridement.  Chief Complaint  Patient presents with   Nail Problem    Pt is here for [facility] PCP is Dr Eluterio Hamburg and LOV was in December.    New problem(s): None   PCP is Abbe Hoard., MD.  No Known Allergies  Review of Systems: Negative except as noted in the HPI.   Objective:  Colin Aubry. is a pleasant 63 y.o. male morbidly obese in NAD. AAO x 3.  Vascular Examination: CFT <4 seconds b/l LE. Diminished DP pulse(s) b/l LE. Diminished PT pulse(s) b/l LE. Pedal hair absent. No pain with calf compression b/l. Lymphedema present BLE. No ischemia or gangrene noted b/l LE. No cyanosis or clubbing noted b/l LE.  Neurological Examination: Sensation grossly intact b/l with 10 gram monofilament. Vibratory sensation intact b/l.  Dermatological Examination: Pedal skin with normal turgor, texture and tone b/l. No open wounds nor interdigital macerations noted. Toenails 1-5 b/l thick, discolored, elongated with subungual debris and pain on dorsal palpation.   Preulcerative lesion noted b/l lower extremities left >right submet head 5 b/l. There is visible subdermal hemorrhage. There is no surrounding erythema, no edema, no drainage, no odor, no fluctuance.  Musculoskeletal Examination: Muscle strength 5/5 to b/l LE.  No pain, crepitus noted b/l. No gross pedal deformities. Patient ambulates independently without assistive aids.    Radiographs: None  Last A1c:       No data to display           Assessment:   1. Pain due to onychomycosis of toenails of both feet   2. Pre-ulcerative calluses   3. PVD (peripheral vascular disease) (HCC)   4. Idiopathic peripheral neuropathy    Plan:  Patient was evaluated and treated. All patient's and/or POA's questions/concerns addressed on today's visit. Mycotic toenails 1-5 debrided in length and girth without incident. Preulcerative lesion(s) submet head 5 b/l pared with sharp debridement without incident. Continue soft, supportive shoe gear daily. Report any pedal injuries to medical professional. Call office if there are any questions/concerns.  Return in about 9 weeks (around 12/29/2023).  Luella Sager, DPM      [place] LOCATION: [address] [place] [address]                   Office [phone]   [facility] LOCATION: [address] [place], [place] [address] Office [phone]

## 2023-12-29 ENCOUNTER — Ambulatory Visit: Payer: 59 | Admitting: Podiatry

## 2023-12-29 ENCOUNTER — Encounter: Payer: Self-pay | Admitting: Podiatry

## 2023-12-29 DIAGNOSIS — M79674 Pain in right toe(s): Secondary | ICD-10-CM | POA: Diagnosis not present

## 2023-12-29 DIAGNOSIS — B351 Tinea unguium: Secondary | ICD-10-CM | POA: Diagnosis not present

## 2023-12-29 DIAGNOSIS — L84 Corns and callosities: Secondary | ICD-10-CM

## 2023-12-29 DIAGNOSIS — M79675 Pain in left toe(s): Secondary | ICD-10-CM

## 2023-12-29 DIAGNOSIS — I739 Peripheral vascular disease, unspecified: Secondary | ICD-10-CM

## 2023-12-29 NOTE — Progress Notes (Signed)
  Subjective:  Patient ID: Colin Hall., male    DOB: 06-24-1961,  MRN: [id number]  Colin Hall. presents to clinic today for at risk foot care. Patient has h/o PAD and preulcerative lesion(s) b/l lower extremities left >right and painful mycotic toenails that limit ambulation. Painful toenails interfere with ambulation. Aggravating factors include wearing enclosed shoe gear. Pain is relieved with periodic professional debridement. Painful preulcerative lesion(s) is/are aggravated when weightbearing with and without shoegear. Pain is relieved with periodic professional debridement.  Chief Complaint  Patient presents with   RFC    Rm4/ not diabetic/ Dr. Thurmond June 2025   New problem(s): None.   PCP is Thurmond Cathlyn LABOR., MD.  No Known Allergies  Review of Systems: Negative except as noted in the HPI.  Objective: No changes noted in today's physical examination. There were no vitals filed for this visit. Colin Hall. is a pleasant 63 y.o. male morbidly obese in NAD. AAO x 3.  Vascular Examination: CFT <4 seconds b/l. DP pulses diminished b/l. PT pulses diminished b/l. Digital hair absent. Skin temperature gradient warm to cool b/l. No ischemia or gangrene. No cyanosis or clubbing noted b/l. Lymphedema present BLE.   Neurological Examination: Sensation grossly intact b/l with 10 gram monofilament.   Dermatological Examination: Pedal skin thin, shiny and atrophic b/l. No open wounds. No interdigital macerations.   Toenails 1-5 b/l thick, discolored, elongated with subungual debris and pain on dorsal palpation.   Preulcerative lesion noted submet head 5 left foot, submet head 5 right foot, and sub 5th met base right foot. There is visible subdermal hemorrhage. There is no surrounding erythema, no edema, no drainage, no odor, no fluctuance.  Musculoskeletal Examination: Normal muscle strength 5/5 to all lower extremity muscle groups bilaterally. No pain, crepitus or joint  limitation noted with ROM b/l LE. Pes planus deformity noted bilateral LE.SABRA Patient ambulates with cane assistance.  Radiographs: None  Last A1c:       No data to display          Assessment/Plan: 1. Pain due to onychomycosis of toenails of both feet   2. Pre-ulcerative calluses   3. PVD (peripheral vascular disease) (HCC)     Consent given for treatment. Toenails 1-5 debrided in length and girth without incident. Preulcerative lesion(s) submet head 5 left foot, submet head 5 right foot, and sub 5th met base right foot pared with sharp debridement without incident.Continue soft, supportive shoe gear daily. Report any pedal injuries to medical professional. Call office if there are any quesitons/concerns. -Patient/POA to call should there be question/concern in the interim.   Return in about 9 weeks (around 03/01/2024).  Delon LITTIE Merlin, DPM      [place] LOCATION: [address], [place] [address]                   Office [phone]   [facility] LOCATION: [address] [place], [place] [address] Office [phone]

## 2024-01-09 NOTE — Progress Notes (Signed)
 " Cardiology Office Note   Date:  01/14/2024  ID:  Colin FORBES Pamelia Mickey., DOB September 03, 1960, MRN [id number] PCP: Thurmond Cathlyn LABOR., MD  [facility] Providers Cardiologist:  Redell Leiter, MD Cardiology APP:  Carlin Delon BROCKS, NP     History of Present Illness Colin Hutmacher. is a 63 y.o. male with a past medical history of paroxysmal atrial fibrillation, morbid obesity, OSA on CPAP, dyslipidemia, unprovoked pulmonary embolism, hypertensive heart disease with HFpEF, RBBB, anemia, morbid obesity.  07/24/2014 echo ED 55%, grade II DD 2016 left heart cath normal coronary arteries   He established care initially in 2016 with Dr. Leiter for evaluation of paroxysmal atrial fibrillation.  Most recently was evaluated by Dr. Leiter in January 2025, he was stable from a cardiac perspective, compensated regarding his heart failure, suggested that he get an Apple Watch for evaluation of his atrial fibrillation, maintaining sinus rhythm and advised to follow up in 1 year.   He presents today for follow up of his atrial fibrillation and heart failure. He is doing overall well, his PCP adjusted his medications and started him on spironolactone and he has diuresed ~ 20 lbs. He is mostly chair bound, is able to ambulate very slowly and carefully with a cane. He follows with his PCP closely at least twice/year if not three times and he wonders if he can push his follow up visits with our office out to yearly. He denies chest pain, palpitations, dyspnea, pnd, orthopnea, n, v, dizziness, syncope, edema, weight gain, or early satiety.   ROS: Review of Systems  Cardiovascular:  Positive for leg swelling.  Musculoskeletal:  Positive for joint pain.  All other systems reviewed and are negative.    Studies Reviewed EKG Interpretation Date/Time:  Tuesday January 13 2024 15:48:41 EDT Ventricular Rate:  79 PR Interval:  164 QRS Duration:  152 QT Interval:  426 QTC Calculation: 488 R Axis:   228  Text  Interpretation: Normal sinus rhythm Right bundle branch block When compared with ECG of 30-Dec-2022 13:38, No significant change was found Confirmed by Carlin Delon [phone]) on 01/13/2024 3:52:23 PM        Risk Assessment/Calculations  CHA2DS2-VASc Score = 4   This indicates a 4.8% annual risk of stroke. The patient's score is based upon: CHF History: 1 HTN History: 1 Diabetes History: 0 Stroke History: 2 Vascular Disease History: 0 Age Score: 0 Gender Score: 0            Physical Exam VS:  BP 111/68   Pulse 79   Ht 5' 9 (1.753 m)   Wt (!) 434 lb (196.9 kg)   SpO2 97%   BMI 64.09 kg/m        Wt Readings from Last 3 Encounters:  01/13/24 (!) 434 lb (196.9 kg)  10/27/23 (!) 457 lb (207.3 kg)  07/15/23 (!) 457 lb 6.4 oz (207.5 kg)    GEN: Well nourished, well developed in no acute distress NECK: No JVD; No carotid bruits CARDIAC: RRR, no murmurs, rubs, gallops RESPIRATORY:  Clear to auscultation without rales, wheezing or rhonchi  ABDOMEN: Soft, non-tender, non-distended EXTREMITIES:  No edema; No deformity   ASSESSMENT AND PLAN PAF/hypercoagulable state - maintaining sinus rhythm, on Xarelto  20 mg daily--no indication for dose reduction CrCl 208, CBC 12/04/2023 revealed stable and known anemia with hgb 12, HCT 36.8. Continue metoprolol  25 mg twice daily, diltiazem  120 mg daily.  Denies hematochezia, hematuria, or hemoptysis.   HFpEF - NYHA class II,  euvolemic. Continue lasix  20 mg PRN (rarely takes), losartan 100 mg daily, metoprolol  25 mg twice daily, spironolactone 25 mg daily. Would likely not be a good candidate for SGLT2i secondary to body habitus. Labs 12/04/23 by PCP revealing potassium 4.5, Cr 1.0.   RBBB - present on EKG today, noted in 2023.  Hypertension - BP controlled today at 111/68, continue losartan 100 mg daily, diltiazem  120 mg daily, metoprolol  25 mg twice daily.   Anemia of unknown cause - on going for him for several years, evaluated by GI and  testing without findings.   Dyslipidemia - managed by PCP, on Crestor  10 mg daily.   OSA on CPAP - compliant, managed by Dr. Mardee              Dispo: Follow up in 1 year, he follows closely with PCP and has CBC twice yearly through their office.   Signed, Delon JAYSON Hoover, NP  "

## 2024-01-13 ENCOUNTER — Encounter: Payer: Self-pay | Admitting: Cardiology

## 2024-01-13 ENCOUNTER — Ambulatory Visit: Attending: Cardiology | Admitting: Cardiology

## 2024-01-13 VITALS — BP 111/68 | HR 79 | Ht 69.0 in | Wt >= 6400 oz

## 2024-01-13 DIAGNOSIS — G4733 Obstructive sleep apnea (adult) (pediatric): Secondary | ICD-10-CM | POA: Diagnosis not present

## 2024-01-13 DIAGNOSIS — Z7901 Long term (current) use of anticoagulants: Secondary | ICD-10-CM | POA: Diagnosis not present

## 2024-01-13 DIAGNOSIS — I5032 Chronic diastolic (congestive) heart failure: Secondary | ICD-10-CM

## 2024-01-13 DIAGNOSIS — I480 Paroxysmal atrial fibrillation: Secondary | ICD-10-CM | POA: Diagnosis not present

## 2024-01-13 DIAGNOSIS — E782 Mixed hyperlipidemia: Secondary | ICD-10-CM

## 2024-01-13 DIAGNOSIS — I110 Hypertensive heart disease with heart failure: Secondary | ICD-10-CM

## 2024-01-13 DIAGNOSIS — I4510 Unspecified right bundle-branch block: Secondary | ICD-10-CM

## 2024-01-13 NOTE — Patient Instructions (Signed)
 Medication Instructions:   *If you need a refill on your cardiac medications before your next appointment, please call your pharmacy*  Lab Work:  If you have labs (blood work) drawn today and your tests are completely normal, you will receive your results only by: MyChart Message (if you have MyChart) OR A paper copy in the mail If you have any lab test that is abnormal or we need to change your treatment, we will call you to review the results.  Testing/Procedures:   Follow-Up: At [facility], you and your health needs are our priority.  As part of our continuing mission to provide you with exceptional heart care, our providers are all part of one team.  This team includes your primary Cardiologist (physician) and Advanced Practice Providers or APPs (Physician Assistants and Nurse Practitioners) who all work together to provide you with the care you need, when you need it.  Your next appointment:   1 year(s)  Provider:   Zoe Hinds, MD    We recommend signing up for the patient portal called "MyChart".  Sign up information is provided on this After Visit Summary.  MyChart is used to connect with patients for Virtual Visits (Telemedicine).  Patients are able to view lab/test results, encounter notes, upcoming appointments, etc.  Non-urgent messages can be sent to your provider as well.   To learn more about what you can do with MyChart, go to [email or web address].   Other Instructions   Have a great year!!

## 2024-03-08 ENCOUNTER — Ambulatory Visit: Admitting: Podiatry

## 2024-03-08 DIAGNOSIS — M79675 Pain in left toe(s): Secondary | ICD-10-CM

## 2024-03-08 DIAGNOSIS — B351 Tinea unguium: Secondary | ICD-10-CM

## 2024-03-08 DIAGNOSIS — I739 Peripheral vascular disease, unspecified: Secondary | ICD-10-CM | POA: Diagnosis not present

## 2024-03-08 DIAGNOSIS — L84 Corns and callosities: Secondary | ICD-10-CM

## 2024-03-08 DIAGNOSIS — M79674 Pain in right toe(s): Secondary | ICD-10-CM

## 2024-03-08 NOTE — Progress Notes (Unsigned)
  Subjective:  Patient ID: Colin Hall., male    DOB: [birth year],  MRN: [id number]  = 63 y.o. male presents with at risk foot care. Patient has h/o PAD and preulcerative lesion(s) of both feet and painful mycotic toenails that limit ambulation. Painful toenails interfere with ambulation. Aggravating factors include wearing enclosed shoe gear. Pain is relieved with periodic professional debridement. Painful preulcerative lesion(s) is/are aggravated when weightbearing with and without shoegear. Pain is relieved with periodic professional debridement. Chief Complaint  Patient presents with   Nail Problem   PCP: Thurmond Cathlyn LABOR., MD. ARNETTA 12/03/2023.  New problem(s): None.   Review of Systems: Negative except as noted in the HPI.   No Known Allergies  Objective:  There were no vitals filed for this visit. Constitutional Patient is a pleasant 63 y.o. African American male morbidly obese in NAD. AAO x 3.  Vascular Capillary fill time to digits <4 seconds.  DP/PT pulse(s) are nonpalpable b/l lower extremities. Pedal hair absent b/l. Lower extremity skin temperature gradient warm to cool b/l. No pain with calf compression b/l. No cyanosis or clubbing noted. No ischemia nor gangrene noted b/l. Lymphedema present BLE.  Neurologic Protective sensation intact 5/5 intact bilaterally with 10g monofilament b/l.   Dermatologic Pedal skin is thin, shiny and atrophic b/l.  No open wounds b/l lower extremities. No interdigital macerations b/l lower extremities. Toenails 1-5 b/l elongated, discolored, dystrophic, thickened, crumbly with subungual debris and tenderness to dorsal palpation. Preulcerative lesion noted submet head 5 left foot, submet head 5 right foot, and sub 5th met base right foot. There is visible subdermal hemorrhage. There is no surrounding erythema, no edema, no drainage, no odor, no fluctuance.  Orthopedic: Muscle strength 5/5 to all lower extremity muscle groups bilaterally. Pes planus  deformity noted bilateral LE. Utilizes cane for ambulation assistance.    Assessment:   1. Pain due to onychomycosis of toenails of both feet   2. Pre-ulcerative calluses   3. PVD (peripheral vascular disease) (HCC)    Plan:  Patient was evaluated and treated. All patient's and/or POA's questions/concerns addressed on today's visit. Mycotic toenails 1-5 debrided in length and girth without incident. Preulcerative lesion(s) submet head 5 b/l and sub 5th met base right foot pared with sharp debridement without incident. Continue soft, supportive shoe gear daily. Report any pedal injuries to medical professional. Call office if there are any questions/concerns. -Patient/POA to call should there be question/concern in the interim.  Return in about 9 weeks (around 05/10/2024).  Delon LITTIE Merlin, DPM      [place] LOCATION: [address], [place] [address]                   Office [phone]   [facility] LOCATION: [address] [place], [place] [address] Office [phone] Delon LITTIE Merlin, DPM

## 2024-03-11 ENCOUNTER — Encounter: Payer: Self-pay | Admitting: Podiatry

## 2024-05-03 ENCOUNTER — Encounter: Payer: Self-pay | Admitting: Podiatry

## 2024-05-03 ENCOUNTER — Ambulatory Visit: Admitting: Podiatry

## 2024-05-03 DIAGNOSIS — L84 Corns and callosities: Secondary | ICD-10-CM | POA: Diagnosis not present

## 2024-05-03 DIAGNOSIS — I739 Peripheral vascular disease, unspecified: Secondary | ICD-10-CM

## 2024-05-03 DIAGNOSIS — M79674 Pain in right toe(s): Secondary | ICD-10-CM | POA: Diagnosis not present

## 2024-05-03 DIAGNOSIS — M79675 Pain in left toe(s): Secondary | ICD-10-CM

## 2024-05-03 DIAGNOSIS — B351 Tinea unguium: Secondary | ICD-10-CM

## 2024-05-09 NOTE — Progress Notes (Signed)
  Subjective:  Patient ID: Colin FORBES Pamelia Mickey., male    DOB: June 10, 1961,  MRN: [id number]  Colin FORBES Pamelia Mickey. presents to clinic today for at risk foot care. Patient has h/o PAD and preulcerative lesion(s) b/l feet and painful mycotic toenails that limit ambulation. Painful toenails interfere with ambulation. Aggravating factors include wearing enclosed shoe gear. Pain is relieved with periodic professional debridement. Painful preulcerative lesion(s) is/are aggravated when weightbearing with and without shoegear. Pain is relieved with periodic professional debridement.  Chief Complaint  Patient presents with   Toe Pain    Dr. Thurmond is  his PCP. Last visit was in  October. A1c 5.9   New problem(s): None.   PCP is Thurmond Cathlyn LABOR., MD.  No Known Allergies  Review of Systems: Negative except as noted in the HPI.  Objective: No changes noted in today's physical examination. There were no vitals filed for this visit. Colin FORBES Pamelia Mickey. is a pleasant 63 y.o. male morbidly obese in NAD. AAO x 3.  Vascular Examination: CFT <4 seconds b/l. DP pulses diminished b/l. PT pulses diminished b/l. Digital hair absent. Skin temperature gradient warm to cool b/l. No ischemia or gangrene. No cyanosis or clubbing noted b/l. Lymphedema present BLE.   Neurological Examination: Sensation grossly intact b/l with 10 gram monofilament.  Dermatological Examination: No open wounds. No interdigital macerations.   Toenails 1-5 b/l thick, discolored, elongated with subungual debris and pain on dorsal palpation.   Hyperkeratotic lesion(s) submet head 5 right foot.  No erythema, no edema, no drainage, no fluctuance. Porokeratotic lesion(s) submet head 5 left foot. No erythema, no edema, no drainage, no fluctuance.  Musculoskeletal Examination: Muscle strength 5/5 to all lower extremity muscle groups bilaterally. No pain, crepitus or joint limitation noted with ROM b/l LE. Pes planus deformity noted bilateral LE.SABRA  Patient ambulates with cane assistance.  Radiographs: None  Assessment/Plan: 1. Pain due to onychomycosis of toenails of both feet   2. Pre-ulcerative calluses   3. PVD (peripheral vascular disease)    -Consent given for treatment as described below: -Examined patient. -Patient to continue soft, supportive shoe gear daily. -Toenails 1-5 b/l were debrided in length and girth with sterile nail nippers and dremel without iatrogenic bleeding.  -Callus(es) submet head 5 right foot pared utilizing sharp debridement with sterile blade without complication or incident. Total number debrided =1. -Porokeratotic lesion(s) submet head 5 left foot pared and enucleated with sterile currette without incident. Total number of lesions debrided=1. -Patient/POA to call should there be question/concern in the interim.   Return in about 9 weeks (around 07/05/2024).  Delon LITTIE Merlin, DPM      [place] LOCATION: [address] [place] [address]                   Office [phone]   [facility] LOCATION: [address] [place], [place] [address] Office [phone]

## 2024-05-17 ENCOUNTER — Encounter (HOSPITAL_COMMUNITY): Payer: Self-pay

## 2024-05-17 ENCOUNTER — Inpatient Hospital Stay (HOSPITAL_COMMUNITY)
Admission: EM | Admit: 2024-05-17 | Discharge: 2024-05-30 | DRG: 872 | Disposition: A | Discharge status: Skilled Nursing Facility | Attending: Internal Medicine | Admitting: Internal Medicine

## 2024-05-17 ENCOUNTER — Emergency Department (HOSPITAL_COMMUNITY)

## 2024-05-17 ENCOUNTER — Other Ambulatory Visit: Payer: Self-pay

## 2024-05-17 DIAGNOSIS — B379 Candidiasis, unspecified: Secondary | ICD-10-CM | POA: Diagnosis present

## 2024-05-17 DIAGNOSIS — N179 Acute kidney failure, unspecified: Secondary | ICD-10-CM | POA: Diagnosis present

## 2024-05-17 DIAGNOSIS — N400 Enlarged prostate without lower urinary tract symptoms: Secondary | ICD-10-CM | POA: Diagnosis present

## 2024-05-17 DIAGNOSIS — E782 Mixed hyperlipidemia: Secondary | ICD-10-CM | POA: Diagnosis present

## 2024-05-17 DIAGNOSIS — B354 Tinea corporis: Secondary | ICD-10-CM | POA: Diagnosis present

## 2024-05-17 DIAGNOSIS — Z9989 Dependence on other enabling machines and devices: Secondary | ICD-10-CM

## 2024-05-17 DIAGNOSIS — Z8249 Family history of ischemic heart disease and other diseases of the circulatory system: Secondary | ICD-10-CM

## 2024-05-17 DIAGNOSIS — R55 Syncope and collapse: Principal | ICD-10-CM | POA: Diagnosis present

## 2024-05-17 DIAGNOSIS — D649 Anemia, unspecified: Secondary | ICD-10-CM

## 2024-05-17 DIAGNOSIS — R531 Weakness: Secondary | ICD-10-CM

## 2024-05-17 DIAGNOSIS — R739 Hyperglycemia, unspecified: Secondary | ICD-10-CM

## 2024-05-17 DIAGNOSIS — I2510 Atherosclerotic heart disease of native coronary artery without angina pectoris: Secondary | ICD-10-CM | POA: Diagnosis present

## 2024-05-17 DIAGNOSIS — B9620 Unspecified Escherichia coli [E. coli] as the cause of diseases classified elsewhere: Secondary | ICD-10-CM | POA: Diagnosis present

## 2024-05-17 DIAGNOSIS — H409 Unspecified glaucoma: Secondary | ICD-10-CM | POA: Diagnosis present

## 2024-05-17 DIAGNOSIS — E860 Dehydration: Secondary | ICD-10-CM | POA: Diagnosis present

## 2024-05-17 DIAGNOSIS — Z6841 Body Mass Index (BMI) 40.0 and over, adult: Secondary | ICD-10-CM

## 2024-05-17 DIAGNOSIS — I10 Essential (primary) hypertension: Secondary | ICD-10-CM | POA: Diagnosis present

## 2024-05-17 DIAGNOSIS — E871 Hypo-osmolality and hyponatremia: Secondary | ICD-10-CM | POA: Diagnosis present

## 2024-05-17 DIAGNOSIS — G4733 Obstructive sleep apnea (adult) (pediatric): Secondary | ICD-10-CM

## 2024-05-17 DIAGNOSIS — Z751 Person awaiting admission to adequate facility elsewhere: Secondary | ICD-10-CM

## 2024-05-17 DIAGNOSIS — E861 Hypovolemia: Secondary | ICD-10-CM | POA: Diagnosis present

## 2024-05-17 DIAGNOSIS — I110 Hypertensive heart disease with heart failure: Secondary | ICD-10-CM | POA: Diagnosis present

## 2024-05-17 DIAGNOSIS — I4819 Other persistent atrial fibrillation: Secondary | ICD-10-CM | POA: Diagnosis present

## 2024-05-17 DIAGNOSIS — Z86711 Personal history of pulmonary embolism: Secondary | ICD-10-CM

## 2024-05-17 DIAGNOSIS — Z87442 Personal history of urinary calculi: Secondary | ICD-10-CM

## 2024-05-17 DIAGNOSIS — M1A9XX0 Chronic gout, unspecified, without tophus (tophi): Secondary | ICD-10-CM | POA: Diagnosis present

## 2024-05-17 DIAGNOSIS — N390 Urinary tract infection, site not specified: Secondary | ICD-10-CM | POA: Diagnosis present

## 2024-05-17 DIAGNOSIS — Z7901 Long term (current) use of anticoagulants: Secondary | ICD-10-CM

## 2024-05-17 DIAGNOSIS — L03314 Cellulitis of groin: Secondary | ICD-10-CM

## 2024-05-17 DIAGNOSIS — A419 Sepsis, unspecified organism: Principal | ICD-10-CM | POA: Diagnosis present

## 2024-05-17 DIAGNOSIS — L03311 Cellulitis of abdominal wall: Secondary | ICD-10-CM | POA: Diagnosis present

## 2024-05-17 DIAGNOSIS — Z79899 Other long term (current) drug therapy: Secondary | ICD-10-CM

## 2024-05-17 DIAGNOSIS — E872 Acidosis, unspecified: Secondary | ICD-10-CM | POA: Diagnosis present

## 2024-05-17 DIAGNOSIS — I5032 Chronic diastolic (congestive) heart failure: Secondary | ICD-10-CM | POA: Diagnosis present

## 2024-05-17 DIAGNOSIS — I480 Paroxysmal atrial fibrillation: Secondary | ICD-10-CM | POA: Diagnosis present

## 2024-05-17 DIAGNOSIS — Z833 Family history of diabetes mellitus: Secondary | ICD-10-CM

## 2024-05-17 LAB — CBC WITH DIFFERENTIAL/PLATELET
Abs Immature Granulocytes: 0.05 K/uL (ref 0.00–0.07)
Basophils Absolute: 0 K/uL (ref 0.0–0.1)
Basophils Relative: 0 %
Eosinophils Absolute: 0 K/uL (ref 0.0–0.5)
Eosinophils Relative: 0 %
HCT: 35.8 % — ABNORMAL LOW (ref 39.0–52.0)
Hemoglobin: 11.7 g/dL — ABNORMAL LOW (ref 13.0–17.0)
Immature Granulocytes: 0 %
Lymphocytes Relative: 7 %
Lymphs Abs: 0.8 K/uL (ref 0.7–4.0)
MCH: 31.6 pg (ref 26.0–34.0)
MCHC: 32.7 g/dL (ref 30.0–36.0)
MCV: 96.8 fL (ref 80.0–100.0)
Monocytes Absolute: 1.1 K/uL — ABNORMAL HIGH (ref 0.1–1.0)
Monocytes Relative: 9 %
Neutro Abs: 9.5 K/uL — ABNORMAL HIGH (ref 1.7–7.7)
Neutrophils Relative %: 84 %
Platelets: 194 K/uL (ref 150–400)
RBC: 3.7 MIL/uL — ABNORMAL LOW (ref 4.22–5.81)
RDW: 14.6 % (ref 11.5–15.5)
WBC: 11.4 K/uL — ABNORMAL HIGH (ref 4.0–10.5)
nRBC: 0 % (ref 0.0–0.2)

## 2024-05-17 LAB — URINALYSIS, W/ REFLEX TO CULTURE (INFECTION SUSPECTED)
Bilirubin Urine: NEGATIVE
Glucose, UA: NEGATIVE mg/dL
Ketones, ur: NEGATIVE mg/dL
Nitrite: NEGATIVE
Protein, ur: NEGATIVE mg/dL
Specific Gravity, Urine: 1.017 (ref 1.005–1.030)
WBC, UA: >50 WBC/hpf (ref 0–5)
pH: 5 (ref 5.0–8.0)

## 2024-05-17 LAB — I-STAT CG4 LACTIC ACID, ED: Lactic Acid, Venous: 1.9 mmol/L (ref 0.5–1.9)

## 2024-05-17 MED ORDER — SODIUM CHLORIDE 0.9 % IV SOLN
1.0000 g | Freq: Once | INTRAVENOUS | Status: AC
  Administered 2024-05-18: 1 g via INTRAVENOUS
  Filled 2024-05-17: qty 10

## 2024-05-17 MED ORDER — LACTATED RINGERS IV BOLUS
500.0000 mL | Freq: Once | INTRAVENOUS | Status: AC
  Administered 2024-05-17: 500 mL via INTRAVENOUS

## 2024-05-17 NOTE — ED Provider Notes (Signed)
 [place] EMERGENCY DEPARTMENT AT [facility] Provider Note   CSN: [id number] Arrival date & time: 05/17/24  2218     Patient presents with: Colin Hall Pamelia Mickey. is a 63 y.o. male.    Fall     Patient has a history of morbid obesity, hypertension, obstructive sleep apnea, atrial fibrillation, hypertension, pulmonary emboli, CHF, arthritis.  Patient states this morning he had an episode where he was getting up and ended up sliding down onto the ground.  He was not able to get himself up on his own had to call 911.  He was assisted up by EMS.  Patient states he otherwise was feeling fine so he did not come to the hospital.  He was able to get up and walk around throughout the day.  He does use a walker.  He does not need to use a wheelchair.  Patient states he had another episode this evening where he felt weak and went to the ground again.  He was not able to get up on his own and neighbors were not able to assist him up.  Patient states he has not been eating or drinking as much today.  He has not noticed any blood in his stool.  He has not had any fevers.  He does not have any chest pain or abdominal pain.  Prior to Admission medications   Medication Sig Start Date End Date Taking? Authorizing Provider  acetaminophen  (TYLENOL ) 650 MG CR tablet Take 1,300 mg by mouth every 8 (eight) hours as needed for pain.    [provider]  allopurinol  (ZYLOPRIM ) 100 MG tablet Take 200 mg by mouth daily. 12/25/20   [provider]  diltiazem  (CARDIZEM  CD) 120 MG 24 hr capsule Take 120 mg by mouth daily. 12/01/19   [provider]  furosemide  (LASIX ) 20 MG tablet Take 20 mg by mouth daily as needed for edema. 12/25/20   [provider]  latanoprost (XALATAN) 0.005 % ophthalmic solution Place 1 drop into both eyes nightly. 12/15/19   [provider]  losartan (COZAAR) 100 MG tablet Take 100 mg by mouth daily. Patient taking differently: Take 50  mg by mouth daily.    [provider]  metoprolol  tartrate (LOPRESSOR ) 25 MG tablet Take 1 tablet (25 mg total) by mouth 2 (two) times daily. 07/23/14   Sebastian Lamarr SAUNDERS, PA-C  Multiple Vitamin (MULTI-VITAMIN DAILY) TABS Take 1 tablet by mouth daily.    [provider]  mupirocin ointment (BACTROBAN) 2 % Apply 1 application  topically daily as needed (boils/ulcers).    [provider]  rivaroxaban  (XARELTO ) 20 MG TABS tablet Take 20 mg by mouth every evening. 12/25/20   [provider]  rosuvastatin (CRESTOR) 10 MG tablet Take 10 mg by mouth every evening. 12/25/20   [provider]  spironolactone (ALDACTONE) 25 MG tablet Take 1 tablet by mouth daily. 06/16/23 06/15/24  [provider]  tamsulosin  (FLOMAX ) 0.4 MG CAPS capsule Take 0.4 mg by mouth daily. 09/22/19   [provider]  timolol (TIMOPTIC) 0.5 % ophthalmic solution Place 1 drop into both eyes 2 (two) times daily. 11/23/23   [provider]  Trospium Chloride 60 MG CP24 Take 1 capsule by mouth daily. 11/09/20   [provider]  Urea  41 % CREA Apply to calloused skin once daily. Patient taking differently: Apply 1 Application topically daily as needed (calluses). 09/21/20   Gaynel Delon CROME, DPM  vitamin B-12 (  CYANOCOBALAMIN) 1000 MCG tablet Take 1,000 mcg by mouth daily.    [provider]    Allergies: Patient has no known allergies.    Review of Systems  Updated Vital Signs BP (!) 144/75   Pulse 98   Temp 98.7 F (37.1 C) (Oral)   Resp 19   Ht 1.753 m (5' 9)   Wt (!) 198.2 kg   SpO2 99%   BMI 64.53 kg/m   Physical Exam Vitals and nursing note reviewed.  Constitutional:      Appearance: He is well-developed.     Comments: Elevated BMI  HENT:     Head: Normocephalic and atraumatic.     Comments: Case membranes dry    Right Ear: External ear normal.     Left Ear: External ear normal.  Eyes:     General: No scleral icterus.        Right eye: No discharge.        Left eye: No discharge.     Conjunctiva/sclera: Conjunctivae normal.  Neck:     Trachea: No tracheal deviation.  Cardiovascular:     Rate and Rhythm: Normal rate and regular rhythm.  Pulmonary:     Effort: Pulmonary effort is normal. No respiratory distress.     Breath sounds: Normal breath sounds. No stridor. No wheezing or rales.  Abdominal:     General: Bowel sounds are normal. There is no distension.     Palpations: Abdomen is soft.     Tenderness: There is no abdominal tenderness. There is no guarding or rebound.  Musculoskeletal:        General: No tenderness or deformity.     Cervical back: Neck supple.     Right lower leg: Edema present.     Left lower leg: Edema present.     Comments: Chronic lymphedema changes noted  Skin:    General: Skin is warm and dry.     Findings: Rash present.     Comments: Malodorous skin folds, erythema in the intertriginous areas  Neurological:     General: No focal deficit present.     Mental Status: He is alert.     Cranial Nerves: No cranial nerve deficit, dysarthria or facial asymmetry.     Sensory: No sensory deficit.     Motor: No abnormal muscle tone or seizure activity.     Coordination: Coordination normal.  Psychiatric:        Mood and Affect: Mood normal.     (all labs ordered are listed, but only abnormal results are displayed) Labs Reviewed  CBC WITH DIFFERENTIAL/PLATELET - Abnormal; Notable for the following components:      Result Value   WBC 11.4 (*)    RBC 3.70 (*)    Hemoglobin 11.7 (*)    HCT 35.8 (*)    Neutro Abs 9.5 (*)    Monocytes Absolute 1.1 (*)    All other components within normal limits  CULTURE, BLOOD (ROUTINE X 2)  CULTURE, BLOOD (ROUTINE X 2)  COMPREHENSIVE METABOLIC PANEL WITH GFR  URINALYSIS, W/ REFLEX TO CULTURE (INFECTION SUSPECTED)  PRO BRAIN NATRIURETIC PEPTIDE  PROTIME-INR  I-STAT CG4 LACTIC ACID, ED  TROPONIN T, HIGH SENSITIVITY     EKG: None  Radiology: DG Chest Port 1 View Result Date: 05/17/2024 CLINICAL DATA:  Questionable sepsis EXAM: PORTABLE CHEST 1 VIEW COMPARISON:  Chest x-ray 07/19/2014 FINDINGS: The heart size and mediastinal contours are within normal limits. Both lungs are clear. The visualized skeletal structures are  unremarkable. IMPRESSION: No active disease. Electronically Signed   By: Greig Pique M.D.   On: 05/17/2024 23:17     Procedures   Medications Ordered in the ED  cefTRIAXone (ROCEPHIN) 1 g in sodium chloride  0.9 % 100 mL IVPB (has no administration in time range)  lactated ringers  bolus 500 mL (500 mLs Intravenous New Bag/Given 05/17/24 2325)                                    Medical Decision Making Amount and/or Complexity of Data Reviewed Labs: ordered. Radiology: ordered.   Pt presented with weakness, inability to stand up.  Pt had 2 episodes today.  On exam pt noted to have erythematous malodorous skin folds.  Could be a source of cellulitis.    Dehydration, aki also a concern.  Labs currently pending.  EKG pending, UA pending. Anticipate need for admission.  Case turned over to Dr Raford     Final diagnoses:  None    ED Discharge Orders     None          Randol Simmonds, MD 05/17/24 2348

## 2024-05-17 NOTE — ED Triage Notes (Signed)
 Pt BIB [facility] from home d/t reduced mobility from pivoting.  Pt has not fallen but has been sliding and unable to get himself up fully.  Pt also has new onset of tremors. Pt lives alone.   BP 150/90 HR 98 O2 99 CBG 118

## 2024-05-17 NOTE — ED Provider Notes (Signed)
 Care assumed form Dr. Randol, patient with weakness and near-syncope, unable to stand at home. Has possible cellulitis in groin skinfolds. Will need to be admitted.  Case is discussed with Dr. Fernand of [facility], who agrees to admit the patient.   Raford Lenis, MD 05/18/24 [phone]

## 2024-05-18 ENCOUNTER — Observation Stay (HOSPITAL_COMMUNITY)

## 2024-05-18 DIAGNOSIS — Z8249 Family history of ischemic heart disease and other diseases of the circulatory system: Secondary | ICD-10-CM | POA: Diagnosis not present

## 2024-05-18 DIAGNOSIS — A419 Sepsis, unspecified organism: Secondary | ICD-10-CM | POA: Diagnosis present

## 2024-05-18 DIAGNOSIS — R55 Syncope and collapse: Secondary | ICD-10-CM

## 2024-05-18 DIAGNOSIS — B9620 Unspecified Escherichia coli [E. coli] as the cause of diseases classified elsewhere: Secondary | ICD-10-CM | POA: Diagnosis present

## 2024-05-18 DIAGNOSIS — L03314 Cellulitis of groin: Secondary | ICD-10-CM | POA: Diagnosis present

## 2024-05-18 DIAGNOSIS — E782 Mixed hyperlipidemia: Secondary | ICD-10-CM | POA: Diagnosis present

## 2024-05-18 DIAGNOSIS — N179 Acute kidney failure, unspecified: Secondary | ICD-10-CM | POA: Diagnosis present

## 2024-05-18 DIAGNOSIS — I5032 Chronic diastolic (congestive) heart failure: Secondary | ICD-10-CM | POA: Diagnosis present

## 2024-05-18 DIAGNOSIS — N390 Urinary tract infection, site not specified: Secondary | ICD-10-CM | POA: Diagnosis present

## 2024-05-18 DIAGNOSIS — Z7901 Long term (current) use of anticoagulants: Secondary | ICD-10-CM | POA: Diagnosis not present

## 2024-05-18 DIAGNOSIS — E861 Hypovolemia: Secondary | ICD-10-CM | POA: Diagnosis present

## 2024-05-18 DIAGNOSIS — L03311 Cellulitis of abdominal wall: Secondary | ICD-10-CM | POA: Diagnosis present

## 2024-05-18 DIAGNOSIS — E872 Acidosis, unspecified: Secondary | ICD-10-CM | POA: Diagnosis present

## 2024-05-18 DIAGNOSIS — I4819 Other persistent atrial fibrillation: Secondary | ICD-10-CM | POA: Diagnosis present

## 2024-05-18 DIAGNOSIS — Z751 Person awaiting admission to adequate facility elsewhere: Secondary | ICD-10-CM | POA: Diagnosis not present

## 2024-05-18 DIAGNOSIS — H409 Unspecified glaucoma: Secondary | ICD-10-CM | POA: Diagnosis present

## 2024-05-18 DIAGNOSIS — N400 Enlarged prostate without lower urinary tract symptoms: Secondary | ICD-10-CM | POA: Diagnosis present

## 2024-05-18 DIAGNOSIS — B354 Tinea corporis: Secondary | ICD-10-CM | POA: Diagnosis present

## 2024-05-18 DIAGNOSIS — I2510 Atherosclerotic heart disease of native coronary artery without angina pectoris: Secondary | ICD-10-CM | POA: Diagnosis present

## 2024-05-18 DIAGNOSIS — G4733 Obstructive sleep apnea (adult) (pediatric): Secondary | ICD-10-CM | POA: Diagnosis present

## 2024-05-18 DIAGNOSIS — E871 Hypo-osmolality and hyponatremia: Secondary | ICD-10-CM | POA: Diagnosis present

## 2024-05-18 DIAGNOSIS — R739 Hyperglycemia, unspecified: Secondary | ICD-10-CM | POA: Diagnosis not present

## 2024-05-18 DIAGNOSIS — E860 Dehydration: Secondary | ICD-10-CM | POA: Diagnosis present

## 2024-05-18 DIAGNOSIS — I110 Hypertensive heart disease with heart failure: Secondary | ICD-10-CM | POA: Diagnosis present

## 2024-05-18 DIAGNOSIS — Z6841 Body Mass Index (BMI) 40.0 and over, adult: Secondary | ICD-10-CM | POA: Diagnosis not present

## 2024-05-18 LAB — COMPREHENSIVE METABOLIC PANEL WITH GFR
ALT: 18 U/L (ref 0–44)
AST: 33 U/L (ref 15–41)
Albumin: 3.9 g/dL (ref 3.5–5.0)
Alkaline Phosphatase: 78 U/L (ref 38–126)
Anion gap: 12 (ref 5–15)
BUN: 56 mg/dL — ABNORMAL HIGH (ref 8–23)
CO2: 20 mmol/L — ABNORMAL LOW (ref 22–32)
Calcium: 10.1 mg/dL (ref 8.9–10.3)
Chloride: 100 mmol/L (ref 98–111)
Creatinine, Ser: 1.72 mg/dL — ABNORMAL HIGH (ref 0.61–1.24)
GFR, Estimated: 44 mL/min — ABNORMAL LOW (ref >60)
Glucose, Bld: 108 mg/dL — ABNORMAL HIGH (ref 70–99)
Potassium: 4.8 mmol/L (ref 3.5–5.1)
Sodium: 132 mmol/L — ABNORMAL LOW (ref 135–145)
Total Bilirubin: 1.5 mg/dL — ABNORMAL HIGH (ref 0.0–1.2)
Total Protein: 8.5 g/dL — ABNORMAL HIGH (ref 6.5–8.1)

## 2024-05-18 LAB — CBC
HCT: 33.4 % — ABNORMAL LOW (ref 39.0–52.0)
Hemoglobin: 10.9 g/dL — ABNORMAL LOW (ref 13.0–17.0)
MCH: 31.7 pg (ref 26.0–34.0)
MCHC: 32.6 g/dL (ref 30.0–36.0)
MCV: 97.1 fL (ref 80.0–100.0)
Platelets: 172 K/uL (ref 150–400)
RBC: 3.44 MIL/uL — ABNORMAL LOW (ref 4.22–5.81)
RDW: 14.7 % (ref 11.5–15.5)
WBC: 10.9 K/uL — ABNORMAL HIGH (ref 4.0–10.5)
nRBC: 0 % (ref 0.0–0.2)

## 2024-05-18 LAB — LACTIC ACID, PLASMA
Lactic Acid, Venous: 0.8 mmol/L (ref 0.5–1.9)
Lactic Acid, Venous: 0.9 mmol/L (ref 0.5–1.9)

## 2024-05-18 LAB — ECHOCARDIOGRAM COMPLETE
Area-P 1/2: 6.88 cm2
Est EF: >75
Height: 69 in
S' Lateral: 2.1 cm
Weight: 6992 [oz_av]

## 2024-05-18 LAB — BASIC METABOLIC PANEL WITH GFR
Anion gap: 12 (ref 5–15)
BUN: 54 mg/dL — ABNORMAL HIGH (ref 8–23)
CO2: 20 mmol/L — ABNORMAL LOW (ref 22–32)
Calcium: 9.4 mg/dL (ref 8.9–10.3)
Chloride: 103 mmol/L (ref 98–111)
Creatinine, Ser: 1.49 mg/dL — ABNORMAL HIGH (ref 0.61–1.24)
GFR, Estimated: 52 mL/min — ABNORMAL LOW (ref >60)
Glucose, Bld: 102 mg/dL — ABNORMAL HIGH (ref 70–99)
Potassium: 4.6 mmol/L (ref 3.5–5.1)
Sodium: 135 mmol/L (ref 135–145)

## 2024-05-18 LAB — PROTIME-INR
INR: 1.8 — ABNORMAL HIGH (ref 0.8–1.2)
Prothrombin Time: 22.1 s — ABNORMAL HIGH (ref 11.4–15.2)

## 2024-05-18 LAB — PRO BRAIN NATRIURETIC PEPTIDE: Pro Brain Natriuretic Peptide: 72 pg/mL (ref <300.0)

## 2024-05-18 LAB — TROPONIN T, HIGH SENSITIVITY
Troponin T High Sensitivity: 47 ng/L — ABNORMAL HIGH (ref 0–19)
Troponin T High Sensitivity: 50 ng/L — ABNORMAL HIGH (ref 0–19)

## 2024-05-18 MED ORDER — RIVAROXABAN 20 MG PO TABS
20.0000 mg | ORAL_TABLET | Freq: Every day | ORAL | Status: DC
  Administered 2024-05-18 – 2024-05-30 (×13): 20 mg via ORAL
  Filled 2024-05-18 (×13): qty 1

## 2024-05-18 MED ORDER — DORZOLAMIDE HCL 2 % OP SOLN
1.0000 [drp] | Freq: Two times a day (BID) | OPHTHALMIC | Status: DC
  Administered 2024-05-18 – 2024-05-30 (×24): 1 [drp] via OPHTHALMIC
  Filled 2024-05-18: qty 10

## 2024-05-18 MED ORDER — PERFLUTREN LIPID MICROSPHERE
1.0000 mL | INTRAVENOUS | Status: AC | PRN
  Administered 2024-05-18: 2 mL via INTRAVENOUS

## 2024-05-18 MED ORDER — NYSTATIN 100000 UNIT/GM EX POWD
Freq: Two times a day (BID) | CUTANEOUS | Status: DC
  Filled 2024-05-18 (×3): qty 15

## 2024-05-18 MED ORDER — LATANOPROST 0.005 % OP SOLN
1.0000 [drp] | Freq: Every day | OPHTHALMIC | Status: DC
  Administered 2024-05-18 – 2024-05-29 (×12): 1 [drp] via OPHTHALMIC
  Filled 2024-05-18: qty 2.5

## 2024-05-18 MED ORDER — ENOXAPARIN SODIUM 40 MG/0.4ML IJ SOSY: 40.0000 mg | PREFILLED_SYRINGE | INTRAMUSCULAR | Status: DC

## 2024-05-18 MED ORDER — TIMOLOL MALEATE 0.5 % OP SOLN
1.0000 [drp] | Freq: Two times a day (BID) | OPHTHALMIC | Status: DC
  Administered 2024-05-18 – 2024-05-30 (×24): 1 [drp] via OPHTHALMIC
  Filled 2024-05-18 (×2): qty 5

## 2024-05-18 MED ORDER — TAMSULOSIN HCL 0.4 MG PO CAPS
0.4000 mg | ORAL_CAPSULE | Freq: Every day | ORAL | Status: DC
  Administered 2024-05-18: 0.4 mg via ORAL
  Filled 2024-05-18: qty 1

## 2024-05-18 MED ORDER — DILTIAZEM HCL ER COATED BEADS 120 MG PO CP24
120.0000 mg | ORAL_CAPSULE | Freq: Every day | ORAL | Status: DC
  Administered 2024-05-18: 120 mg via ORAL
  Filled 2024-05-18: qty 1

## 2024-05-18 MED ORDER — SODIUM CHLORIDE 0.9 % IV BOLUS
1000.0000 mL | Freq: Once | INTRAVENOUS | Status: AC
  Administered 2024-05-18: 1000 mL via INTRAVENOUS

## 2024-05-18 MED ORDER — SODIUM CHLORIDE 0.9 % IV SOLN: INTRAVENOUS | Status: AC

## 2024-05-18 MED ORDER — ROSUVASTATIN CALCIUM 10 MG PO TABS
10.0000 mg | ORAL_TABLET | Freq: Every evening | ORAL | Status: DC
  Administered 2024-05-18 – 2024-05-29 (×12): 10 mg via ORAL
  Filled 2024-05-18 (×12): qty 1

## 2024-05-18 MED ORDER — SODIUM CHLORIDE 0.9 % IV SOLN
2.0000 g | INTRAVENOUS | Status: DC
  Administered 2024-05-18 – 2024-05-21 (×3): 2 g via INTRAVENOUS
  Filled 2024-05-18 (×3): qty 20

## 2024-05-18 MED ORDER — ENSURE PLUS HIGH PROTEIN PO LIQD
237.0000 mL | Freq: Two times a day (BID) | ORAL | Status: DC
  Administered 2024-05-19 – 2024-05-28 (×5): 237 mL via ORAL

## 2024-05-18 MED ORDER — CHLORHEXIDINE GLUCONATE CLOTH 2 % EX PADS
6.0000 | MEDICATED_PAD | Freq: Every day | CUTANEOUS | Status: DC
  Administered 2024-05-18: 6 via TOPICAL

## 2024-05-18 MED ORDER — ACETAMINOPHEN 325 MG PO TABS
650.0000 mg | ORAL_TABLET | Freq: Three times a day (TID) | ORAL | Status: DC | PRN
  Administered 2024-05-23: 650 mg via ORAL
  Filled 2024-05-18: qty 2

## 2024-05-18 MED ORDER — METOPROLOL TARTRATE 25 MG PO TABS
25.0000 mg | ORAL_TABLET | Freq: Two times a day (BID) | ORAL | Status: DC
  Filled 2024-05-18: qty 1

## 2024-05-18 MED ORDER — CEFAZOLIN SODIUM-DEXTROSE 2-4 GM/100ML-% IV SOLN
2.0000 g | Freq: Three times a day (TID) | INTRAVENOUS | Status: DC
  Administered 2024-05-18: 2 g via INTRAVENOUS
  Filled 2024-05-18 (×2): qty 100

## 2024-05-18 NOTE — Progress Notes (Signed)
   05/18/24 2315  BiPAP/CPAP/SIPAP  $ Non-Invasive Home Ventilator  Initial (machine did not come with pt from the ED.SABRApt now has a resmed)  BiPAP/CPAP/SIPAP Pt Type Adult  BiPAP/CPAP/SIPAP Resmed  Mask Type Full face mask  Mask Size Large  FiO2 (%) 21 %  Patient Home Machine No  Patient Home Mask No  Patient Home Tubing No  Auto Titrate Yes  Minimum cmH2O 15 cmH2O  Maximum cmH2O 25 cmH2O  Device Plugged into RED Power Outlet Yes  BiPAP/CPAP /SiPAP Vitals  Resp 20  MEWS Score/Color  MEWS Score 0  MEWS Score Color Green

## 2024-05-18 NOTE — Progress Notes (Signed)
 OT Cancellation Note  Patient Details Name: Colin Hall. MRN: [id number] DOB: 1960/07/27   Cancelled Treatment:    Reason Eval/Treat Not Completed: Medical issues which prohibited therapy  Patient was on BiPAP when OT eval attempted. OT will follow when off.    Antonae Zbikowski OT/L Acute Rehabilitation Department  [phone]  05/18/2024, 11:10 AM

## 2024-05-18 NOTE — Progress Notes (Signed)
 PT Cancellation Note  Patient Details Name: Colin Hall. MRN: [id number] DOB: Feb 07, 1961   Cancelled Treatment:    Reason Eval/Treat Not Completed: Medical issues which prohibited therapy  Patient  was  on BiPAP> will follow when off. [name] PT Acute Rehabilitation Services Office [phone]  [name] 05/18/2024, 10:26 AM

## 2024-05-18 NOTE — Progress Notes (Signed)
-----------------------------------------------------------  [facility]--------------------------------------------------- --------------------------------------------------------D(Data) A(Action) R(response) Note------------------------------------------------  Patient Name: Colin Hall Pamelia Mickey. Patient DOB: 05-10-1961 Date: @TODAY @      Data: Reviewed labs, VS, notes.    Action: No action needed at this time.      Response:       Sharolyn Batman, RN [facility]

## 2024-05-18 NOTE — Progress Notes (Signed)
 BP has remained soft throughout the day.  Mentating well. Lactate normal.   I think this is under-fluid resuscitation or dynamap inaccuracy, not shock.    Continue fluids, upgrade to progressive.

## 2024-05-18 NOTE — Progress Notes (Signed)
  Progress Note   Patient: Colin Hall. FMW:[id number] DOB: 1960-11-03 DOA: 05/17/2024     0 DOS: the patient was seen and examined on 05/18/2024 at 10:55AM     Brief hospital course: 63 y.o. M with MO, dCHF, OSA on CPAP, AF on Xarelto , HTN, and CAD who presented with generalized weakness.  Typically lives alone, independent with transfers/ambulation with rollator and can still drive, son stays with him a few days a week.  In the last few days, he has been generally weaker, twice had presyncopal episodes where he was about to pass out, and finally was too weak to stand up, so EMS were called.  In the ER, creatinine 1.7, urinalysis suggestive of infection, there was also some consideration of cellulitis.  Started on broad-spectrum antibiotics and admitted.   Assessment and Plan: Weakness The chief complaint.  Characterized as generalized.  From available data, this is from underlying infection. - PT/OT  Suspected UTI EDP was concerned about skin folds (in the groin? Calves?).  Patient to me reports no new pain in groin skin or calves, but does note new urinary urgency.  Given UA, I suspect he has pyelonephritis. - Hold antihypertensives - Continue Rocephin - Follow urine and blood cultures - IV fluids - Foley placed in ER for convenience, should be removed within 24 hours   Acute kidney injury Creatinine up to 1.7 on admission, improved to 1.4 with fluids today - Hold nephrotoxins - Hold diuretics - Continue IV fluids - Trend Cr  Morbid obesity BMI 64, complicates care  Chronic diastolic congestive heart failure Essential hypertension Coronary artery disease Appears dehydrated Blood pressure soft - Continue Crestor - Hold furosemide , diltiazem , losartan, metoprolol , spironolactone  Sleep apnea -CPAP at night  Paroxysmal atrial fibrillation In sinus here - Continue Xarelto  - Hold metoprolol  and diltiazem  until hemodynamics clear  Glaucoma - Continue  eyedrops  BPH - Continue tamsulosin          Subjective: Patient is gradually feeling better, mentating okay, appetite good.     Physical Exam: BP (!) 93/42   Pulse 99   Temp 98.4 F (36.9 C) (Oral)   Resp (!) 21   Ht 5' 9 (1.753 m)   Wt (!) 198.2 kg   SpO2 93%   BMI 64.53 kg/m   The patient was seen and examined  Data Reviewed: This is a no charge note, for further details, please see H&P by Dr. Dorinda from [facility] today            Author: Lonni SHAUNNA Dalton, MD 05/18/2024 12:05 PM  For on call review [email or web address].

## 2024-05-18 NOTE — Progress Notes (Signed)
  Echocardiogram 2D Echocardiogram has been performed.  Colin Hall 05/18/2024, 1:48 PM

## 2024-05-18 NOTE — ED Notes (Signed)
 Hospitalist at bedside to evaluate patient after RN contacted them for down trending blood pressures. MD placed fluid bolus order, but is ok with lower BP's at this time due to difficulties obtaining an accurate diastolic value at this time.

## 2024-05-18 NOTE — Progress Notes (Signed)
   05/18/24 0424  BiPAP/CPAP/SIPAP  $ Non-Invasive Home Ventilator  Initial  $ Face Mask Large  Yes  BiPAP/CPAP/SIPAP Pt Type Adult  BiPAP/CPAP/SIPAP DREAMSTATIOND  Mask Type Full face mask  Dentures removed? Not applicable  Mask Size Large  Respiratory Rate 19 breaths/min  FiO2 (%) 21 %  Patient Home Machine No  Patient Home Mask No  Patient Home Tubing No  Auto Titrate Yes  Minimum cmH2O 15 cmH2O  Maximum cmH2O 25 cmH2O  Device Plugged into RED Power Outlet Yes

## 2024-05-18 NOTE — H&P (Signed)
 History and Physical    Patient: Colin Hall. FMW:[id number] DOB: Jul 22, 1960 DOA: 05/17/2024 DOS: the patient was seen and examined on 05/18/2024 PCP: Thurmond Cathlyn LABOR., MD  Patient coming from: Home  Chief Complaint: Generalized weakness Chief Complaint  Patient presents with   Fall   HPI: Colin Hall. is a 63 y.o. male with medical history significant of diastolic congestive heart failure, atrial fibrillation on Xarelto , hypertension, coronary artery disease, hyperlipidemia, OSA on CPAP at night, prediabetes who lives with the grandson at home brought in by EMS after patient experience near syncopal episode and inability to walk.  According to patient he moves about with a rollator however he was feeling weak and only able to walk and so he slide on the floor in an attempt to get to his rollator but he was unsuccessful and therefore called 911.  Upon arrival of EMS according to patient 911 said he was okay and so patient was not brought to the emergency room.  He was able to move about in the house with his rollator for some time and then later still felt as though he was about to pass out and so became very weak and therefore called 911 again.  Upon their arrival patient was noted to be more weak than they had found him on their previous arrival and therefore recommended to be brought to the emergency room for further management. Patient denied nausea vomiting abdominal pain chest pain cough or urinary complaints  ED course: Upon arrival to the emergency room patient was found to have temperature 98.7, respiratory rate 33, pulse 99, blood pressure 174/130 saturating 95%.  Lab results concerning for acute kidney injury.  With creatinine of 1.7. Given concerns of near syncope, cellulitis as well as acute kidney injury hospitalist service was contacted to admit patient for further management.  Review of Systems: As mentioned in the history of present illness. All other systems reviewed  and are negative. Past Medical History:  Diagnosis Date   Acute diastolic CHF (congestive heart failure) (HCC) 07/21/2014   Adult BMI >=70 kg/sq m (HCC) 12/25/2015   Anemia, chronic disease 12/25/2015   Arthritis    back (07/20/2014)   Atrial fibrillation with rapid ventricular response (HCC) 07/20/2014   Bilateral pleural effusion 07/21/2014   Cellulitis 09/19/2016   Cellulitis of left lower extremity 09/19/2016   Chronic anemia    Chronic anticoagulation 01/14/2015   Chronic diastolic heart failure (HCC) 01/14/2015   Chronic edema 12/25/2015   Chronic gout of multiple sites 12/25/2015   Coronary artery calcification seen on CT scan 01/14/2015   Overview:  Dense calcifications on CT and normal coranaries on catheterization Jan 2016   DDD (degenerative disc disease), lumbosacral 12/25/2015   Demand ischemia (HCC) 07/20/2014   Dyspnea    False positive serological test for hepatitis C 01/16/2017   Folic acid deficiency 12/25/2015   Glaucoma    Glaucoma 12/25/2015   Gout    Hematuria 01/16/2016   Overview:  [place]   High risk medication use 12/25/2015   HTN (hypertension) 07/20/2014   Hypertension    Hypertensive heart failure (HCC) 07/20/2014   Iron deficiency 12/02/2019   Kidney cyst, acquired 01/16/2017   Overview:  Complex. Felt benign by [place]   Kidney stone 12/25/2015   Kidney stones    multiple   Malaise and fatigue 12/25/2015   Mild CAD 07/21/2014   Mixed hyperlipidemia 12/25/2015   Morbid obesity (HCC) 08/29/2010   pt is 500 lbs  Morbid obesity with BMI of 60.0-69.9, adult (HCC) 12/25/2015   New onset atrial fibrillation (HCC) 07/19/2014   w/RVR   OSA on CPAP    Osteoarthritis of basilar joint of thumb 04/20/2023   Osteoarthritis of multiple joints 12/25/2015   PAF (paroxysmal atrial fibrillation) (HCC) 01/14/2015   Pre-diabetes    Prediabetes 12/25/2015   Pulmonary emboli (HCC) 07/21/2014   UTI (urinary tract infection) 07/20/2014   Vitamin B12  deficiency 12/25/2015   Past Surgical History:  Procedure Laterality Date   CARDIAC CATHETERIZATION  03/2013   no blockages   CYSTOSCOPY W/ STONE MANIPULATION  ~ 2010   CYSTOSCOPY/URETEROSCOPY/HOLMIUM LASER/STENT PLACEMENT Bilateral 01/13/2023   Procedure: CYSTOSCOPY, BILATERAL URETEROSCOPY/HOLMIUM LASER/STENT PLACEMENT;  Surgeon: Carolee Sherwood JONETTA DOUGLAS, MD;  Location: [facility];  Service: Urology;  Laterality: Bilateral;  90 MINS FOR CASE   CYSTOSCOPY/URETEROSCOPY/HOLMIUM LASER/STENT PLACEMENT Bilateral 01/27/2023   Procedure: CYSTOSCOPY BILATERAL URETEROSCOPY/HOLMIUM LASER/STENT PLACEMENT;  Surgeon: Carolee Sherwood JONETTA DOUGLAS, MD;  Location: [facility];  Service: Urology;  Laterality: Bilateral;  90 MINS FOR CASE   LAPAROSCOPIC GASTRIC SLEEVE RESECTION  08/2013   gastric sleeve   LEFT HEART CATHETERIZATION WITH CORONARY ANGIOGRAM N/A 07/20/2014   Procedure: LEFT HEART CATHETERIZATION WITH CORONARY ANGIOGRAM;  Surgeon: Deatrice DELENA Cage, MD;  Location: [facility];  Service: Cardiovascular;  Laterality: N/A;   TONSILLECTOMY  05/1965   Social History:  reports that he has never smoked. He has never used smokeless tobacco. He reports that he does not currently use alcohol. He reports that he does not currently use drugs after having used the following drugs: Marijuana.  No Known Allergies  Family History  Problem Relation Age of Onset   Dementia Mother    Diabetes Father    Heart attack Daughter     Prior to Admission medications   Medication Sig Start Date End Date Taking? Authorizing Provider  acetaminophen  (TYLENOL ) 650 MG CR tablet Take 1,300 mg by mouth every 8 (eight) hours as needed for pain.    [provider]  allopurinol  (ZYLOPRIM ) 100 MG tablet Take 200 mg by mouth daily. 12/25/20   [provider]  diltiazem  (CARDIZEM  CD) 120 MG 24 hr capsule Take 120 mg by mouth daily. 12/01/19   [provider]  furosemide  (LASIX ) 20 MG tablet Take 20 mg by mouth daily as needed for  edema. 12/25/20   [provider]  latanoprost (XALATAN) 0.005 % ophthalmic solution Place 1 drop into both eyes nightly. 12/15/19   [provider]  losartan (COZAAR) 100 MG tablet Take 100 mg by mouth daily. Patient taking differently: Take 50 mg by mouth daily.    [provider]  metoprolol  tartrate (LOPRESSOR ) 25 MG tablet Take 1 tablet (25 mg total) by mouth 2 (two) times daily. 07/23/14   Sebastian Lamarr SAUNDERS, PA-C  Multiple Vitamin (MULTI-VITAMIN DAILY) TABS Take 1 tablet by mouth daily.    [provider]  mupirocin ointment (BACTROBAN) 2 % Apply 1 application  topically daily as needed (boils/ulcers).    [provider]  rivaroxaban  (XARELTO ) 20 MG TABS tablet Take 20 mg by mouth every evening. 12/25/20   [provider]  rosuvastatin (CRESTOR) 10 MG tablet Take 10 mg by mouth every evening. 12/25/20   [provider]  spironolactone (ALDACTONE) 25 MG tablet Take 1 tablet by mouth daily. 06/16/23 06/15/24  [provider]  tamsulosin  (FLOMAX ) 0.4 MG CAPS capsule Take 0.4 mg by mouth daily. 09/22/19   [provider]  timolol (  TIMOPTIC) 0.5 % ophthalmic solution Place 1 drop into both eyes 2 (two) times daily. 11/23/23   [provider]  Trospium Chloride 60 MG CP24 Take 1 capsule by mouth daily. 11/09/20   [provider]  Urea  41 % CREA Apply to calloused skin once daily. Patient taking differently: Apply 1 Application topically daily as needed (calluses). 09/21/20   Gaynel Delon CROME, DPM  vitamin B-12 (CYANOCOBALAMIN) 1000 MCG tablet Take 1,000 mcg by mouth daily.    [provider]    Physical Exam: Vitals:   05/18/24 0030 05/18/24 0039 05/18/24 0040 05/18/24 0105  BP: 135/69     Pulse: (!) 107  (!) 106 (!) 106  Resp: (!) 29 20  (!) 40  Temp:      TempSrc:      SpO2: 99%  98% 99%  Weight:      Height:       General: Obese male laying in bed in no acute distress HEENT: No  abnormality detected Respiratory system: Clear to auscultation bilaterally Cardiovascular system: S1-S2 present Abdominal system: Nontender, morbidly obese with anterior abdominal wall erythema closer to the groin, bilateral upper thigh and groin erythema concerning for cellulitis noted Musculoskeletal: Extensive stasis dermatitis noted to bilateral lower extremities with chronic edema CNS: Alert and oriented x 3 has limited mobility in the lower extremities due to body habitus Psych: Normal mood   Data Reviewed: Chest x-ray reviewed that did not show any acute pathology     Latest Ref Rng & Units 05/17/2024   11:08 PM 01/27/2023   12:25 PM 12/30/2022    1:30 PM  CBC  WBC 4.0 - 10.5 K/uL 11.4  10.4  4.6   Hemoglobin 13.0 - 17.0 g/dL 88.2  86.7  87.8   Hematocrit 39.0 - 52.0 % 35.8  42.8  39.0   Platelets 150 - 400 K/uL 194  183  225        Latest Ref Rng & Units 05/17/2024   11:08 PM 01/27/2023   12:25 PM 12/30/2022    1:30 PM  BMP  Glucose 70 - 99 mg/dL 891  875  95   BUN 8 - 23 mg/dL 56  26  24   Creatinine 0.61 - 1.24 mg/dL 8.27  8.69  9.10   Sodium 135 - 145 mmol/L 132  136  139   Potassium 3.5 - 5.1 mmol/L 4.8  4.1  4.0   Chloride 98 - 111 mmol/L 100  104  107   CO2 22 - 32 mmol/L 20  23  25    Calcium  8.9 - 10.3 mg/dL 89.8  8.7  8.9       Assessment and Plan:  Moderate nonpurulent cellulitis involving the anterior abdominal wall as well as left upper thigh. We will cover with cefazolin  Continue to monitor CBC as well as blood culture results  Mild hyponatremia Metabolic acidosis Presented with bicarb of 20 and sodium 132 Continue IV fluids  Near syncope Follow-up on echocardiogram Monitor telemetry closely PT OT evaluation  Acute kidney injury likely secondary to poor oral intake Presented with creatinine 1.72 with baseline creatinine of 0.9 to 1.3 Continue supplemental IV fluid cautiously in the setting of diastolic congestive heart failure Monitor renal  function closely Avoid nephrotoxic medications Renally dose all drugs  Mild troponin elevation in the setting of above Patient presented with troponin 50 which downtrended to 47 Denies any chest pains at this time Monitor on telemetry  Abdominal wall yeast infection Placed on  nystatin  Ambulatory dysfunction PT OT consulted  Chronic diastolic congestive heart failure Monitor daily weight as well as input and output  Chronic persistent atrial fibrillation on Xarelto  Continue Cardizem  and Xarelto  Continue telemetry monitoring  Essential hypertension  Continue metoprolol  Holding off nephrotoxic agents at this time until renal function improves  Hyperlipidemia Continue rosuvastatin  BPH  continue Flomax   OSA on CPAP  Continue CPAP at night  DVT prophylaxis-continue Xarelto    Advance Care Planning:   Code Status: Prior full code  Consults: None  Family Communication: None present at bedside  Severity of Illness: The appropriate patient status for this patient is INPATIENT. Inpatient status is judged to be reasonable and necessary in order to provide the required intensity of service to ensure the patient's safety. The patient's presenting symptoms, physical exam findings, and initial radiographic and laboratory data in the context of their chronic comorbidities is felt to place them at high risk for further clinical deterioration. Furthermore, it is not anticipated that the patient will be medically stable for discharge from the hospital within 2 midnights of admission.   * I certify that at the point of admission it is my clinical judgment that the patient will require inpatient hospital care spanning beyond 2 midnights from the point of admission due to high intensity of service, high risk for further deterioration and high frequency of surveillance required.*  Author: Drue ONEIDA Potter, MD 05/18/2024 1:06 AM  For on call review [email or web address].

## 2024-05-18 NOTE — Progress Notes (Signed)
 [facility]   Bed Availability Yes  Level of Care Needed:  Yes  MD Agree to transfer: N/A  Patient agree to transfer: No  Sharolyn Batman, RN [facility]

## 2024-05-19 ENCOUNTER — Inpatient Hospital Stay (HOSPITAL_COMMUNITY)

## 2024-05-19 DIAGNOSIS — E871 Hypo-osmolality and hyponatremia: Secondary | ICD-10-CM

## 2024-05-19 DIAGNOSIS — R55 Syncope and collapse: Secondary | ICD-10-CM | POA: Diagnosis not present

## 2024-05-19 DIAGNOSIS — R739 Hyperglycemia, unspecified: Secondary | ICD-10-CM | POA: Diagnosis not present

## 2024-05-19 DIAGNOSIS — N179 Acute kidney failure, unspecified: Secondary | ICD-10-CM | POA: Diagnosis not present

## 2024-05-19 DIAGNOSIS — L03314 Cellulitis of groin: Secondary | ICD-10-CM | POA: Diagnosis not present

## 2024-05-19 LAB — BLOOD CULTURE ID PANEL (REFLEXED) - BCID2

## 2024-05-19 LAB — MISC LABCORP TEST (SEND OUT): Labcorp test code: 83935

## 2024-05-19 MED ORDER — VANCOMYCIN HCL 2000 MG/400ML IV SOLN
2000.0000 mg | Freq: Once | INTRAVENOUS | Status: AC
  Administered 2024-05-19: 2000 mg via INTRAVENOUS
  Filled 2024-05-19: qty 400

## 2024-05-19 MED ORDER — VANCOMYCIN HCL 1250 MG/250ML IV SOLN
1250.0000 mg | Freq: Two times a day (BID) | INTRAVENOUS | Status: DC
  Administered 2024-05-20: 1250 mg via INTRAVENOUS
  Filled 2024-05-19: qty 250

## 2024-05-19 MED ORDER — TAMSULOSIN HCL 0.4 MG PO CAPS
0.8000 mg | ORAL_CAPSULE | Freq: Every day | ORAL | Status: DC
  Administered 2024-05-20 – 2024-05-30 (×11): 0.8 mg via ORAL
  Filled 2024-05-19 (×12): qty 2

## 2024-05-19 NOTE — Progress Notes (Addendum)
 [facility] PROGRESS NOTE    Progress Note  Colin FORBES Pamelia Mickey.  FMW:[id number] DOB: 1960-09-25 DOA: 05/17/2024 PCP: Thurmond Cathlyn LABOR., MD     Brief Narrative:   Colin FORBES Pamelia Mickey. is an 63 y.o. male past medical history of HFpEF, obstructive sleep apnea on CPAP, atrial fibrillation on Xarelto  CAD comes in with generalized weakness, patient resides in an independent living facility transfers and ambulates with a rollator can still drive found to have a possible UTI, with acute kidney injury in the ED on labs    Assessment/Plan:   Sepsis secondary to UTI: Antihypertensive medications were held on admission. Blood cultures pending and urine cultures grew more than 100,000 colonies of gram-negative rods He was started on IV Rocephin and IV fluids. Discontinue Foley.  Acute kidney injury: Previously 1.4. Diuretics were held. He was started on IV fluids.  His creatinine this morning is 1.4. KVO IV fluids.  Tinea corporis Continue statin powder.  HFpEF/essential hypertension: Appears hypervolemic on physical exam. Lasix  diltiazem  losartan metoprolol  and Aldactone were held on admission. Blood pressure still borderline continue to hold these medications.  Obstructive sleep apnea: Continue CPAP at night.  Paroxysmal atrial fibrillation: Rate controlled holding diltiazem  and metoprolol  as blood pressure was borderline. Continue Xarelto .  Glaucoma: Continue eyedrops.  BPH: Continue Flomax  will increase dose.  Morbid obesity: Noted.   DVT prophylaxis: Xarelto  Family Communication:none Status is: Inpatient Remains inpatient appropriate because: Sepsis secondary to UTI    Code Status:     Code Status Orders  (From admission, onward)           Start     Ordered   05/18/24 0118  Full code  Continuous       Question:  By:  Answer:  Consent: discussion documented in EHR   05/18/24 0118           Code Status History     Date Active Date Inactive Code  Status Order ID Comments User Context   09/19/2016 0333 09/20/2016 2138 Full Code [id number]  Franky Redia SAILOR, MD Inpatient   07/20/2014 1717 07/22/2014 1914 Full Code [id number]  Darron Deatrice LABOR, MD Inpatient   07/20/2014 0013 07/20/2014 1717 Full Code [id number]  Matthias Cough, MD Inpatient      Advance Directive Documentation    Flowsheet Row Most Recent Value  Type of Advance Directive Living will, Healthcare Power of Attorney  Pre-existing out of facility DNR order (yellow form or pink MOST form) --  MOST Form in Place? --      IV Access:   Peripheral IV   Procedures and diagnostic studies:   ECHOCARDIOGRAM COMPLETE Result Date: 05/18/2024    ECHOCARDIOGRAM REPORT   Patient Name:   Colin Grieser. Date of Exam: 05/18/2024 Medical Rec #:  [id number]         Height:       69.0 in Accession #:    [id number]        Weight:       437.0 lb Date of Birth:  [birth year]          BSA:          2.880 m Patient Age:    63 years          BP:           100/44 mmHg Patient Gender: M                 HR:  98 bpm. Exam Location:  Inpatient Procedure: 2D Echo and Intracardiac Opacification Agent (Both Spectral and Color            Flow Doppler were utilized during procedure). Indications:    Syncope  History:        Patient has no prior history of Echocardiogram examinations.  Sonographer:    Charmaine Gaskins Referring Phys: [id number] PRINCE T DJAN  Sonographer Comments: Technically difficult study due to poor echo windows. Image acquisition challenging due to patient body habitus and 437 lbs. IMPRESSIONS  1. Vigorous LV function with turbulent flow through LV/LVOT No significant obstruction to outflow at rest or with Valsalva. Left ventricular ejection fraction, by estimation, is >75%. The left ventricle has hyperdynamic function. The left ventricle has no regional wall motion abnormalities. There is mild concentric left ventricular hypertrophy.  2. Right ventricular systolic function is normal.  The right ventricular size is normal.  3. The mitral valve is normal in structure. Trivial mitral valve regurgitation.  4. The aortic valve is tricuspid. Aortic valve regurgitation is not visualized. Aortic valve sclerosis/calcification is present, without any evidence of aortic stenosis.  5. The inferior vena cava is dilated in size with <50% respiratory variability, suggesting right atrial pressure of 15 mmHg. FINDINGS  Left Ventricle: Vigorous LV function with turbulent flow through LV/LVOT No significant obstruction to outflow at rest or with Valsalva. Left ventricular ejection fraction, by estimation, is >75%. The left ventricle has hyperdynamic function. The left ventricle has no regional wall motion abnormalities. Definity contrast agent was given IV to delineate the left ventricular endocardial borders. The left ventricular internal cavity size was normal in size. There is mild concentric left ventricular hypertrophy. Right Ventricle: The right ventricular size is normal. Right vetricular wall thickness was not assessed. Right ventricular systolic function is normal. Left Atrium: Left atrial size was normal in size. Right Atrium: Right atrial size was normal in size. Pericardium: Trivial pericardial effusion is present. Mitral Valve: The mitral valve is normal in structure. Trivial mitral valve regurgitation. Tricuspid Valve: The tricuspid valve is normal in structure. Tricuspid valve regurgitation is trivial. Aortic Valve: The aortic valve is tricuspid. Aortic valve regurgitation is not visualized. Aortic valve sclerosis/calcification is present, without any evidence of aortic stenosis. Pulmonic Valve: The pulmonic valve was not well visualized. Aorta: The aortic root and ascending aorta are structurally normal, with no evidence of dilitation. Venous: The inferior vena cava is dilated in size with less than 50% respiratory variability, suggesting right atrial pressure of 15 mmHg. IAS/Shunts: No atrial level  shunt detected by color flow Doppler.  LEFT VENTRICLE PLAX 2D LVIDd:         4.40 cm   Diastology LVIDs:         2.10 cm   LV e' medial:    11.80 cm/s LV PW:         1.20 cm   LV E/e' medial:  7.8 LV IVS:        1.20 cm   LV e' lateral:   11.90 cm/s LVOT diam:     2.50 cm   LV E/e' lateral: 7.8 LVOT Area:     4.91 cm  RIGHT VENTRICLE RV S prime:     21.30 cm/s LEFT ATRIUM           Index LA diam:      4.10 cm 1.42 cm/m LA Vol (A4C): 64.6 ml 22.43 ml/m   AORTA Ao Root diam: 3.20 cm Ao Asc diam:  3.40  cm MITRAL VALVE                TRICUSPID VALVE MV Area (PHT): 6.88 cm     TR Peak grad:   30.9 mmHg MV E velocity: 92.60 cm/s   TR Vmax:        278.00 cm/s MV A velocity: 134.00 cm/s MV E/A ratio:  0.69         SHUNTS                             Systemic Diam: 2.50 cm Vina Gull MD Electronically signed by Vina Gull MD Signature Date/Time: 05/18/2024/4:59:16 PM    Final    DG Chest Port 1 View Result Date: 05/17/2024 CLINICAL DATA:  Questionable sepsis EXAM: PORTABLE CHEST 1 VIEW COMPARISON:  Chest x-ray 07/19/2014 FINDINGS: The heart size and mediastinal contours are within normal limits. Both lungs are clear. The visualized skeletal structures are unremarkable. IMPRESSION: No active disease. Electronically Signed   By: Greig Pique M.D.   On: 05/17/2024 23:17     Medical Consultants:   None.   Subjective:    Colin FORBES Pamelia Mickey. relates he feels significantly better than yesterday.  Objective:    Vitals:   05/18/24 2315 05/19/24 0158 05/19/24 0500 05/19/24 0541  BP:  (!) 117/59  127/70  Pulse:  84  81  Resp: 20 20  20   Temp:  97.7 F (36.5 C)    TempSrc:      SpO2:  97%  99%  Weight:   (!) 198.3 kg   Height:       SpO2: 99 % FiO2 (%): 21 %   Intake/Output Summary (Last 24 hours) at 05/19/2024 1024 Last data filed at 05/19/2024 0500 Gross per 24 hour  Intake 2042.49 ml  Output 2200 ml  Net -157.51 ml   Filed Weights   05/17/24 2226 05/19/24 0500  Weight: (!) 198.2 kg (!)  198.3 kg    Exam: General exam: In no acute distress. Respiratory system: Good air movement and clear to auscultation. Cardiovascular system: S1 & S2 heard, RRR. No JVD. Gastrointestinal system: Abdomen is nondistended, soft and nontender.  Extremities: No pedal edema. Skin: No rashes, lesions or ulcers Psychiatry: Judgement and insight appear normal. Mood & affect appropriate.    Data Reviewed:    Labs: Basic Metabolic Panel: Recent Labs  Lab 05/17/24 2308 05/18/24 0330  NA 132* 135  K 4.8 4.6  CL 100 103  CO2 20* 20*  GLUCOSE 108* 102*  BUN 56* 54*  CREATININE 1.72* 1.49*  CALCIUM  10.1 9.4   GFR Estimated Creatinine Clearance: 86.2 mL/min (A) (by C-G formula based on SCr of 1.49 mg/dL (H)). Liver Function Tests: Recent Labs  Lab 05/17/24 2308  AST 33  ALT 18  ALKPHOS 78  BILITOT 1.5*  PROT 8.5*  ALBUMIN 3.9   No results for input(s): LIPASE, AMYLASE in the last 168 hours. No results for input(s): AMMONIA in the last 168 hours. Coagulation profile Recent Labs  Lab 05/18/24 0007  INR 1.8*   COVID-19 Labs  No results for input(s): DDIMER, FERRITIN, LDH, CRP in the last 72 hours.  No results found for: SARSCOV2NAA  CBC: Recent Labs  Lab 05/17/24 2308 05/18/24 0330  WBC 11.4* 10.9*  NEUTROABS 9.5*  --   HGB 11.7* 10.9*  HCT 35.8* 33.4*  MCV 96.8 97.1  PLT 194 172   Cardiac Enzymes: No results for input(s):  CKTOTAL, CKMB, CKMBINDEX, TROPONINI in the last 168 hours. BNP (last 3 results) Recent Labs    05/17/24 2308  PROBNP 72.0   CBG: No results for input(s): GLUCAP in the last 168 hours. D-Dimer: No results for input(s): DDIMER in the last 72 hours. Hgb A1c: No results for input(s): HGBA1C in the last 72 hours. Lipid Profile: No results for input(s): CHOL, HDL, LDLCALC, TRIG, CHOLHDL, LDLDIRECT in the last 72 hours. Thyroid function studies: No results for input(s): TSH, T4TOTAL, T3FREE,  THYROIDAB in the last 72 hours.  Invalid input(s): FREET3 Anemia work up: No results for input(s): VITAMINB12, FOLATE, FERRITIN, TIBC, IRON, RETICCTPCT in the last 72 hours. Sepsis Labs: Recent Labs  Lab 05/17/24 2308 05/17/24 2325 05/18/24 0330 05/18/24 1053 05/18/24 1857  WBC 11.4*  --  10.9*  --   --   LATICACIDVEN  --  1.9  --  0.8 0.9   Microbiology Recent Results (from the past 240 hours)  Blood Culture (routine x 2)     Status: None (Preliminary result)   Collection Time: 05/17/24 11:08 PM   Specimen: Left Antecubital; Blood  Result Value Ref Range Status   Specimen Description   Final    LEFT ANTECUBITAL Performed at [facility], [address], [place], [place] [address]    Special Requests   Final    BOTTLES DRAWN AEROBIC AND ANAEROBIC Blood Culture results may not be optimal due to an inadequate volume of blood received in culture bottles Performed at [facility], [address] [place], [place] [address]    Culture   Final    NO GROWTH < 24 HOURS Performed at [facility], [address], [place], [place] [address]    Report Status PENDING  Incomplete  Urine Culture     Status: Abnormal (Preliminary result)   Collection Time: 05/17/24 11:08 PM   Specimen: Urine, Random  Result Value Ref Range Status   Specimen Description   Final    URINE, RANDOM Performed at [facility], [address] [place], [place] [address]    Special Requests   Final    NONE Reflexed from [id number] Performed at [facility], [address] [place], [place] [address]    Culture (A)  Final    >=100,000 COLONIES/mL GRAM NEGATIVE RODS SUSCEPTIBILITIES TO FOLLOW Performed at [facility], [address] [place], [place] [address]    Report Status PENDING  Incomplete  Blood Culture (routine x 2)     Status: None (Preliminary result)   Collection Time: 05/17/24 11:27 PM    Specimen: BLOOD LEFT FOREARM  Result Value Ref Range Status   Specimen Description   Final    BLOOD LEFT FOREARM Performed at [facility], [address], [place], [place] [address]    Special Requests   Final    BOTTLES DRAWN AEROBIC AND ANAEROBIC Blood Culture results may not be optimal due to an inadequate volume of blood received in culture bottles Performed at [facility], [address], [place], [place] [address]    Culture   Final    NO GROWTH < 24 HOURS Performed at [facility], [address], [place], [place] [address]    Report Status PENDING  Incomplete     Medications:    Chlorhexidine  Gluconate Cloth  6 each Topical Daily   dorzolamide  1 drop Both Eyes BID   feeding supplement  237 mL Oral BID BM   latanoprost   1 drop Both Eyes QHS   nystatin    Topical BID   rivaroxaban   20 mg Oral Daily   rosuvastatin   10 mg Oral QPM   tamsulosin   0.4 mg Oral Daily   timolol   1 drop Both Eyes BID   Continuous Infusions:  cefTRIAXone  (ROCEPHIN )  IV 2 g (05/18/24 2232)      LOS: 1 day   Erle Odell Castor  [facility]  05/19/2024, 10:24 AM

## 2024-05-19 NOTE — Evaluation (Signed)
 Occupational Therapy Evaluation Patient Details Name: Colin Hall. MRN: [id number] DOB: 13-Nov-1960 Today's Date: 05/19/2024   History of Present Illness   Pt is a 63 y/o M admitted on 05/17/24 after presenting with c/o generalized weakness. Pt is being treated for suspected UTI. PMH: morbid obesity, dCHF, OSA on CPAP, AF on xarelto , HTN, CAD     Clinical Impressions Prior to this admission, patient living alone with his grandson residing occasionally. Patient was independent in his ADLs and mobility with a rollator. Patient wore slides for shoes and would navigate bathroom with cane. Currently, patient with drainage from L abdominal folds, and need for signficant assist in order to transition to the EOB. Patient with posterior lean, and requiring intermittent assist in order to be able to maintain sitting balance (up to mod A). Patient max A of 2 for bed mobility, and max A for ADL management. OT recommending lesser intensive rehab prior to discharge home < 3 hours. OT will continue to follow acutely.     If plan is discharge home, recommend the following:   Two people to help with walking and/or transfers;Two people to help with bathing/dressing/bathroom;Help with stairs or ramp for entrance;Assist for transportation     Functional Status Assessment   Patient has had a recent decline in their functional status and demonstrates the ability to make significant improvements in function in a reasonable and predictable amount of time.     Equipment Recommendations   Other (comment) (defer to next venue)     Recommendations for Other Services         Precautions/Restrictions   Precautions Precautions: Fall Recall of Precautions/Restrictions: Intact Restrictions Weight Bearing Restrictions Per Provider Order: No     Mobility Bed Mobility Overal bed mobility: Needs Assistance Bed Mobility: Supine to Sit, Sit to Supine, Rolling Rolling: Max assist, Used rails, +2 for  physical assistance   Supine to sit: Max assist, +2 for safety/equipment, +2 for physical assistance, HOB elevated, Used rails Sit to supine: Max assist, HOB elevated, +2 for safety/equipment, +2 for physical assistance, Used rails        Transfers                   General transfer comment: deferred due to poor balance sitting EOB      Balance Overall balance assessment: Needs assistance Sitting-balance support: Feet supported, Bilateral upper extremity supported Sitting balance-Leahy Scale: Fair Sitting balance - Comments: cuing to correct posterior lean with min assist with pt progressing to correcting without cuing                                   ADL either performed or assessed with clinical judgement   ADL Overall ADL's : Needs assistance/impaired Eating/Feeding: Set up;Sitting   Grooming: Set up;Wash/dry face;Wash/dry hands;Sitting   Upper Body Bathing: Minimal assistance;Sitting   Lower Body Bathing: Total assistance;+2 for physical assistance;+2 for safety/equipment;Bed level   Upper Body Dressing : Minimal assistance;Sitting   Lower Body Dressing: Total assistance;Bed level     Toilet Transfer Details (indicate cue type and reason): did not attempt Toileting- Clothing Manipulation and Hygiene: Bed level;Total assistance;+2 for physical assistance;+2 for safety/equipment       Functional mobility during ADLs: Maximal assistance;+2 for safety/equipment;+2 for physical assistance;Cueing for safety;Cueing for sequencing General ADL Comments: Prior to this admission, patient living alone with his grandson residing occasionally. Patient was independent in his  ADLs and mobility with a rollator. Patient wore slides for shoes and would navigate bathroom with cane. Currently, patient with drainage from L abdominal folds, and need for signficant assist in order to transition to the EOB. Patient with posterior lean, and requiring intermittent assist in  order to be able to maintain sitting balance (up to mod A). Patient max A of 2 for bed mobility, and max A for ADL management. OT recommending lesser intensive rehab prior to discharge home < 3 hours. OT will continue to follow acutely.     Vision Baseline Vision/History: 1 Wears glasses Ability to See in Adequate Light: 0 Adequate Patient Visual Report: No change from baseline Vision Assessment?: No apparent visual deficits     Perception Perception: Not tested       Praxis Praxis: Not tested       Pertinent Vitals/Pain Pain Assessment Pain Assessment: No/denies pain     Extremity/Trunk Assessment Upper Extremity Assessment Upper Extremity Assessment: Generalized weakness;Right hand dominant   Lower Extremity Assessment Lower Extremity Assessment: Defer to PT evaluation   Cervical / Trunk Assessment Cervical / Trunk Assessment: Other exceptions (BLE lymphedema, drainage from L sided skin folds, brusing to R knee & R flank, increased body habitus)   Communication Communication Communication: No apparent difficulties   Cognition Arousal: Alert Behavior During Therapy: WFL for tasks assessed/performed Cognition: No apparent impairments                               Following commands: Intact       Cueing  General Comments   Cueing Techniques: Verbal cues      Exercises     Shoulder Instructions      Home Living Family/patient expects to be discharged to:: Private residence Living Arrangements: Other relatives (grandson in & out (at the house ~3-4 days/week)) Available Help at Discharge: Available PRN/intermittently Type of Home: House Home Access: Stairs to enter Secretary/administrator of Steps: 2 Entrance Stairs-Rails: Right Home Layout: One level     Bathroom Shower/Tub: Chief Strategy Officer: Standard     Home Equipment: Rollator (4 wheels);Grab bars - tub/shower          Prior Functioning/Environment Prior Level of  Function : Independent/Modified Independent;Driving             Mobility Comments: independent with a rollator in the home, but had to use SPC to access bathroom as bathroom doorway too narrow ADLs Comments: mod I    OT Problem List: Decreased strength;Decreased range of motion;Decreased activity tolerance;Impaired balance (sitting and/or standing);Decreased coordination;Obesity;Increased edema   OT Treatment/Interventions: Self-care/ADL training;Therapeutic exercise;DME and/or AE instruction;Energy conservation;Manual therapy;Patient/family education;Balance training;Therapeutic activities      OT Goals(Current goals can be found in the care plan section)   Acute Rehab OT Goals Patient Stated Goal: to get better to go home OT Goal Formulation: With patient Time For Goal Achievement: 06/02/24 Potential to Achieve Goals: Fair   OT Frequency:  Min 2X/week    Co-evaluation   Reason for Co-Treatment: For patient/therapist safety PT goals addressed during session: Mobility/safety with mobility;Balance OT goals addressed during session: Proper use of Adaptive equipment and DME;Strengthening/ROM;ADL's and self-care      AM-PAC OT 6 Clicks Daily Activity     Outcome Measure Help from another person eating meals?: A Little Help from another person taking care of personal grooming?: A Little Help from another person toileting, which includes using toliet,  bedpan, or urinal?: Total Help from another person bathing (including washing, rinsing, drying)?: A Lot Help from another person to put on and taking off regular upper body clothing?: A Little Help from another person to put on and taking off regular lower body clothing?: Total 6 Click Score: 13   End of Session Nurse Communication: Mobility status;Other (comment) (drainage from folds)  Activity Tolerance: Patient limited by fatigue Patient left: in bed;with call bell/phone within reach  OT Visit Diagnosis: Unsteadiness on  feet (R26.81);Other abnormalities of gait and mobility (R26.89);Muscle weakness (generalized) (M62.81)                Time: 9167-9099 OT Time Calculation (min): 28 min Charges:  OT General Charges $OT Visit: 1 Visit OT Evaluation $OT Eval Moderate Complexity: 1 Mod  Ronal Gift E. Jasiel Belisle, OTR/L Acute Rehabilitation Services [phone]   Ronal Gift Salt 05/19/2024, 3:03 PM

## 2024-05-19 NOTE — Evaluation (Signed)
 Physical Therapy Evaluation Patient Details Name: Colin Hall. MRN: [id number] DOB: 05-Feb-1961 Today's Date: 05/19/2024  History of Present Illness  Pt is a 63 y/o M admitted on 05/17/24 after presenting with c/o generalized weakness. Pt is being treated for suspected UTI. PMH: morbid obesity, dCHF, OSA on CPAP, AF on xarelto , HTN, CAD  Clinical Impression  Pt seen for PT evaluation with pt agreeable to tx, co-tx with OT. Pt reports prior to admission he was independent with rollator in the home (used [facility] to access bathroom). On this date pt requires max assist +2 for bed mobility with hospital bed features. Pt noted to have drainage from L sided skin folds. Recommend ongoing PT services to progress mobility as able.        If plan is discharge home, recommend the following: Two people to help with walking and/or transfers;Two people to help with bathing/dressing/bathroom   Can travel by private vehicle   No    Equipment Recommendations Other (comment) (defer to next venue)  Recommendations for Other Services  Rehab consult    Functional Status Assessment Patient has had a recent decline in their functional status and demonstrates the ability to make significant improvements in function in a reasonable and predictable amount of time.     Precautions / Restrictions Precautions Precautions: Fall Recall of Precautions/Restrictions: Intact Restrictions Weight Bearing Restrictions Per Provider Order: No      Mobility  Bed Mobility Overal bed mobility: Needs Assistance Bed Mobility: Supine to Sit, Sit to Supine, Rolling Rolling: Max assist, Used rails, +2 for physical assistance   Supine to sit: Max assist, +2 for safety/equipment, +2 for physical assistance, HOB elevated, Used rails Sit to supine: Max assist, HOB elevated, +2 for safety/equipment, +2 for physical assistance, Used rails        Transfers                        Ambulation/Gait                   Stairs            Wheelchair Mobility     Tilt Bed    Modified Rankin (Stroke Patients Only)       Balance Overall balance assessment: Needs assistance Sitting-balance support: Feet supported, Bilateral upper extremity supported Sitting balance-Leahy Scale: Fair Sitting balance - Comments: cuing to correct posterior lean with min assist with pt progressing to correcting without cuing                                     Pertinent Vitals/Pain Pain Assessment Pain Assessment: No/denies pain    Home Living Family/patient expects to be discharged to:: Private residence Living Arrangements: Other relatives (grandson in & out (at the house ~3-4 days/week)) Available Help at Discharge: Available PRN/intermittently Type of Home: House Home Access: Stairs to enter Entrance Stairs-Rails: Right Entrance Stairs-Number of Steps: 2   Home Layout: One level Home Equipment: Rollator (4 wheels);Grab bars - tub/shower      Prior Function Prior Level of Function : Independent/Modified Independent;Driving             Mobility Comments: independent with a rollator in the home, but had to use [facility] to access bathroom as bathroom doorway too narrow ADLs Comments: mod I     Extremity/Trunk Assessment   Upper Extremity Assessment Upper Extremity Assessment: Generalized  weakness    Lower Extremity Assessment Lower Extremity Assessment: Generalized weakness    Cervical / Trunk Assessment Cervical / Trunk Assessment:  (BLE lymphadema, drainage from L sided skin folds, brusing to R knee & R flank)  Communication   Communication Communication: No apparent difficulties    Cognition Arousal: Alert Behavior During Therapy: WFL for tasks assessed/performed   PT - Cognitive impairments: No apparent impairments                         Following commands: Intact       Cueing Cueing Techniques: Verbal cues     General Comments       Exercises     Assessment/Plan    PT Assessment Patient needs continued PT services  PT Problem List Decreased strength;Decreased range of motion;Decreased activity tolerance;Decreased balance;Decreased mobility;Decreased knowledge of use of DME;Decreased skin integrity;Obesity       PT Treatment Interventions Balance training;DME instruction;Gait training;Neuromuscular re-education;Stair training;Functional mobility training;Patient/family education;Therapeutic activities;Manual techniques;Therapeutic exercise    PT Goals (Current goals can be found in the Care Plan section)  Acute Rehab PT Goals Patient Stated Goal: get better PT Goal Formulation: With patient Time For Goal Achievement: 06/02/24 Potential to Achieve Goals: Fair    Frequency Min 2X/week     Co-evaluation PT/OT/SLP Co-Evaluation/Treatment: Yes Reason for Co-Treatment: For patient/therapist safety PT goals addressed during session: Mobility/safety with mobility;Balance         AM-PAC PT 6 Clicks Mobility  Outcome Measure Help needed turning from your back to your side while in a flat bed without using bedrails?: Total Help needed moving from lying on your back to sitting on the side of a flat bed without using bedrails?: Total Help needed moving to and from a bed to a chair (including a wheelchair)?: Total Help needed standing up from a chair using your arms (e.g., wheelchair or bedside chair)?: Total Help needed to walk in hospital room?: Total Help needed climbing 3-5 steps with a railing? : Total 6 Click Score: 6    End of Session   Activity Tolerance: Patient tolerated treatment well Patient left: in bed;with call bell/phone within reach;with bed alarm set Nurse Communication:  (drainage from L sided skin folds) PT Visit Diagnosis: Muscle weakness (generalized) (M62.81);Difficulty in walking, not elsewhere classified (R26.2)    Time: 9167-9140 PT Time Calculation (min) (ACUTE ONLY): 27  min   Charges:   PT Evaluation $PT Eval Low Complexity: 1 Low   PT General Charges $$ ACUTE PT VISIT: 1 Visit         Richerd Pinal, PT, DPT 05/19/24, 9:15 AM   Richerd CHRISTELLA Pinal 05/19/2024, 9:14 AM

## 2024-05-19 NOTE — Progress Notes (Signed)
 PHARMACY - PHYSICIAN COMMUNICATION CRITICAL VALUE ALERT - BLOOD CULTURE IDENTIFICATION (BCID)  Colin Hagood. is an 63 y.o. male who presented to [facility] on 05/17/2024 with a chief complaint of  Chief Complaint  Patient presents with   Fall     Assessment:  BCx 1/4 bottles show GPC, none ID on BCID panel  Name of physician (or Provider) Contacted: Dr. Odell Castor   Current antibiotics: ceftriaxone  Changes to prescribed antibiotics recommended:  - Add vancomycin  - Vancomycin  2000 mg IV x1 then 1250 mg IV q12h ( wt 198.3 kg, Scr 1.49 mg/dl, Vd 0.5, AUC 451)   Results for orders placed or performed during the hospital encounter of 05/17/24  Blood Culture ID Panel (Reflexed) (Collected: 05/17/2024 11:08 PM)  Result Value Ref Range   Enterococcus faecalis NOT DETECTED NOT DETECTED   Enterococcus Faecium NOT DETECTED NOT DETECTED   Listeria monocytogenes NOT DETECTED NOT DETECTED   Staphylococcus species NOT DETECTED NOT DETECTED   Staphylococcus aureus (BCID) NOT DETECTED NOT DETECTED   Staphylococcus epidermidis NOT DETECTED NOT DETECTED   Staphylococcus lugdunensis NOT DETECTED NOT DETECTED   Streptococcus species NOT DETECTED NOT DETECTED   Streptococcus agalactiae NOT DETECTED NOT DETECTED   Streptococcus pneumoniae NOT DETECTED NOT DETECTED   Streptococcus pyogenes NOT DETECTED NOT DETECTED   A.calcoaceticus-baumannii NOT DETECTED NOT DETECTED   Bacteroides fragilis NOT DETECTED NOT DETECTED   Enterobacterales NOT DETECTED NOT DETECTED   Enterobacter cloacae complex NOT DETECTED NOT DETECTED   Escherichia coli NOT DETECTED NOT DETECTED   Klebsiella aerogenes NOT DETECTED NOT DETECTED   Klebsiella oxytoca NOT DETECTED NOT DETECTED   Klebsiella pneumoniae NOT DETECTED NOT DETECTED   Proteus species NOT DETECTED NOT DETECTED   Salmonella species NOT DETECTED NOT DETECTED   Serratia marcescens NOT DETECTED NOT DETECTED   Haemophilus influenzae NOT DETECTED NOT  DETECTED   Neisseria meningitidis NOT DETECTED NOT DETECTED   Pseudomonas aeruginosa NOT DETECTED NOT DETECTED   Stenotrophomonas maltophilia NOT DETECTED NOT DETECTED   Candida albicans NOT DETECTED NOT DETECTED   Candida auris NOT DETECTED NOT DETECTED   Candida glabrata NOT DETECTED NOT DETECTED   Candida krusei NOT DETECTED NOT DETECTED   Candida parapsilosis NOT DETECTED NOT DETECTED   Candida tropicalis NOT DETECTED NOT DETECTED   Cryptococcus neoformans/gattii NOT DETECTED NOT DETECTED    Dolphus Roller, PharmD, BCPS 05/19/2024 5:57 PM

## 2024-05-20 DIAGNOSIS — N179 Acute kidney failure, unspecified: Secondary | ICD-10-CM | POA: Diagnosis not present

## 2024-05-20 DIAGNOSIS — R739 Hyperglycemia, unspecified: Secondary | ICD-10-CM | POA: Diagnosis not present

## 2024-05-20 DIAGNOSIS — L03314 Cellulitis of groin: Secondary | ICD-10-CM | POA: Diagnosis not present

## 2024-05-20 DIAGNOSIS — R55 Syncope and collapse: Secondary | ICD-10-CM | POA: Diagnosis not present

## 2024-05-20 LAB — URINE CULTURE: Culture: >=100000 — AB

## 2024-05-20 LAB — BASIC METABOLIC PANEL WITH GFR
Anion gap: 7 (ref 5–15)
BUN: 30 mg/dL — ABNORMAL HIGH (ref 8–23)
CO2: 21 mmol/L — ABNORMAL LOW (ref 22–32)
Calcium: 8.5 mg/dL — ABNORMAL LOW (ref 8.9–10.3)
Chloride: 109 mmol/L (ref 98–111)
Creatinine, Ser: 0.99 mg/dL (ref 0.61–1.24)
GFR, Estimated: >60 mL/min (ref >60)
Glucose, Bld: 125 mg/dL — ABNORMAL HIGH (ref 70–99)
Potassium: 3.9 mmol/L (ref 3.5–5.1)
Sodium: 138 mmol/L (ref 135–145)

## 2024-05-20 MED ORDER — METOPROLOL TARTRATE 25 MG PO TABS
25.0000 mg | ORAL_TABLET | Freq: Two times a day (BID) | ORAL | Status: DC
  Administered 2024-05-20 – 2024-05-30 (×21): 25 mg via ORAL
  Filled 2024-05-20 (×21): qty 1

## 2024-05-20 MED ORDER — LINEZOLID 600 MG/300ML IV SOLN
600.0000 mg | Freq: Two times a day (BID) | INTRAVENOUS | Status: DC
  Administered 2024-05-20: 600 mg via INTRAVENOUS
  Filled 2024-05-20 (×2): qty 300

## 2024-05-20 NOTE — TOC Initial Note (Signed)
 Transition of Care (TOC) - Initial/Assessment Note    Patient Details  Name: Colin Hall. MRN: [id number] Date of Birth: 1961/05/10  Transition of Care [facility]) CM/SW Contact:    Tawni CHRISTELLA Eva, LCSW Phone Number: 05/20/2024,   Clinical Narrative:                 CSW met with the pt at bedside. The pt is from home and stated that he lives mostly by himself, though his grandson sometimes stays with him. CSW discussed the recommendation for SNF placement, and the pt is agreeable. CSW explained the process and informed the pt of the barriers to placement. Pt will need insurance authorization. ICM will fax the pt out for SNF. Care management to follow.  Expected Discharge Plan: Skilled Nursing Facility Barriers to Discharge: Continued Medical Work up   Patient Goals and CMS Choice Patient states their goals for this hospitalization and ongoing recovery are:: SNF to get stronger          Expected Discharge Plan and Services       Living arrangements for the past 2 months: Single Family Home                                      Prior Living Arrangements/Services Living arrangements for the past 2 months: Single Family Home Lives with:: Self, Relatives Patient language and need for interpreter reviewed:: No        Need for Family Participation in Patient Care: No (Comment) Care giver support system in place?: No (comment)   Criminal Activity/Legal Involvement Pertinent to Current Situation/Hospitalization: Yes - Comment as needed  Activities of Daily Living   ADL Screening (condition at time of admission) Independently performs ADLs?: No (use walkers and canes) Does the patient have a NEW difficulty with bathing/dressing/toileting/self-feeding that is expected to last >3 days?: Yes (Initiates electronic notice to provider for possible OT consult) Does the patient have a NEW difficulty with getting in/out of bed, walking, or climbing stairs that is expected  to last >3 days?: Yes (Initiates electronic notice to provider for possible PT consult) Does the patient have a NEW difficulty with communication that is expected to last >3 days?: No Is the patient deaf or have difficulty hearing?: No Does the patient have difficulty seeing, even when wearing glasses/contacts?: No Does the patient have difficulty concentrating, remembering, or making decisions?: No  Permission Sought/Granted                  Emotional Assessment Appearance:: Appears stated age Attitude/Demeanor/Rapport: Gracious Affect (typically observed): Accepting Orientation: : Oriented to Self, Oriented to Place, Oriented to  Time, Oriented to Situation      Admission diagnosis:  Cellulitis of groin [L03.314] Hyponatremia [E87.1] Weakness [R53.1] Acute kidney injury (nontraumatic) [N17.9] Near syncope [R55] Sepsis secondary to UTI (HCC) [A41.9, N39.0] Normochromic normocytic anemia [D64.9] Elevated random blood glucose level [R73.9] Patient Active Problem List   Diagnosis Date Noted   Near syncope 05/18/2024   Sepsis secondary to UTI (HCC) 05/18/2024   Dyspnea    Osteoarthritis of basilar joint of thumb 04/20/2023   Kidney stones    Hypertension    Chronic anemia    Arthritis    Iron deficiency 12/02/2019   False positive serological test for hepatitis C 01/16/2017   Kidney cyst, acquired 01/16/2017   Cellulitis 09/19/2016   Cellulitis of left lower extremity 09/19/2016  Hematuria 01/16/2016   Anemia, chronic disease 12/25/2015   Kidney stone 12/25/2015   Chronic gout of multiple sites 12/25/2015   DDD (degenerative disc disease), lumbosacral 12/25/2015   Folic acid deficiency 12/25/2015   Glaucoma 12/25/2015   High risk medication use 12/25/2015   Malaise and fatigue 12/25/2015   Mixed hyperlipidemia 12/25/2015   Adult BMI >=70 kg/sq m (HCC) 12/25/2015   Osteoarthritis of multiple joints 12/25/2015   Prediabetes 12/25/2015   Morbid obesity with BMI of  60.0-69.9, adult (HCC) 12/25/2015   Chronic edema 12/25/2015   Vitamin B12 deficiency 12/25/2015   Chronic anticoagulation 01/14/2015   Chronic diastolic heart failure (HCC) 01/14/2015   Coronary artery calcification seen on CT scan 01/14/2015   PAF (paroxysmal atrial fibrillation) (HCC) 01/14/2015   Pulmonary emboli (HCC) 07/21/2014   Acute diastolic CHF (congestive heart failure) (HCC) 07/21/2014   Bilateral pleural effusion 07/21/2014   Mild CAD 07/21/2014   Morbid obesity (HCC) 07/20/2014   UTI (urinary tract infection) 07/20/2014   Demand ischemia (HCC) 07/20/2014   Hypertensive heart failure (HCC) 07/20/2014   OSA on CPAP 07/20/2014   HTN (hypertension) 07/20/2014   Atrial fibrillation with rapid ventricular response (HCC) 07/20/2014   New onset atrial fibrillation (HCC) 07/19/2014   PCP:  Thurmond Cathlyn LABOR., MD Pharmacy:   [facility] [phone] - [place], [place] - [address] [facility] [place] [address] Phone: [phone] Fax: [phone]     Social Drivers of Health (SDOH) Social History: SDOH Screenings   Food Insecurity: No Food Insecurity (05/18/2024)  Housing: Low Risk  (05/18/2024)  Transportation Needs: No Transportation Needs (05/18/2024)  Utilities: Not At Risk (05/18/2024)  Tobacco Use: Low Risk  (05/17/2024)   SDOH Interventions:     Readmission Risk Interventions     No data to display

## 2024-05-20 NOTE — Progress Notes (Signed)
   05/20/24 2329  BiPAP/CPAP/SIPAP  BiPAP/CPAP/SIPAP Pt Type Adult  BiPAP/CPAP/SIPAP Resmed  Mask Type Full face mask  Dentures removed? Not applicable  Mask Size Large  FiO2 (%) 21 %  Patient Home Machine No  Patient Home Mask No  Patient Home Tubing No  Auto Titrate Yes  Minimum cmH2O 15 cmH2O  Maximum cmH2O 25 cmH2O  CPAP/SIPAP surface wiped down Yes  Device Plugged into RED Power Outlet Yes

## 2024-05-20 NOTE — NC FL2 (Signed)
 [place]  MEDICAID FL2 LEVEL OF CARE FORM     IDENTIFICATION  Patient Name: Colin Hall. Birthdate: [birth year] Sex: male Admission Date (Current Location): 05/17/2024  [facility] and [place] Number:  Producer, Television/film/video and Address:  [facility],  [address], [place] [address]      Provider Number: [id number]  Attending Physician Name and Address:  Odell Celinda Balo, MD  Relative Name and Phone Number:  [facility])  [phone]    Current Level of Care: Hospital Recommended Level of Care: Skilled Nursing Facility Prior Approval Number:    Date Approved/Denied:   PASRR Number: [id number]  Discharge Plan: SNF    Current Diagnoses: Patient Active Problem List   Diagnosis Date Noted   Near syncope 05/18/2024   Sepsis secondary to UTI (HCC) 05/18/2024   Dyspnea    Osteoarthritis of basilar joint of thumb 04/20/2023   Kidney stones    Hypertension    Chronic anemia    Arthritis    Iron deficiency 12/02/2019   False positive serological test for hepatitis C 01/16/2017   Kidney cyst, acquired 01/16/2017   Cellulitis 09/19/2016   Cellulitis of left lower extremity 09/19/2016   Hematuria 01/16/2016   Anemia, chronic disease 12/25/2015   Kidney stone 12/25/2015   Chronic gout of multiple sites 12/25/2015   DDD (degenerative disc disease), lumbosacral 12/25/2015   Folic acid deficiency 12/25/2015   Glaucoma 12/25/2015   High risk medication use 12/25/2015   Malaise and fatigue 12/25/2015   Mixed hyperlipidemia 12/25/2015   Adult BMI >=70 kg/sq m (HCC) 12/25/2015   Osteoarthritis of multiple joints 12/25/2015   Prediabetes 12/25/2015   Morbid obesity with BMI of 60.0-69.9, adult (HCC) 12/25/2015   Chronic edema 12/25/2015   Vitamin B12 deficiency 12/25/2015   Chronic anticoagulation 01/14/2015   Chronic diastolic heart failure (HCC) 01/14/2015   Coronary artery calcification seen on CT scan 01/14/2015   PAF (paroxysmal atrial  fibrillation) (HCC) 01/14/2015   Pulmonary emboli (HCC) 07/21/2014   Acute diastolic CHF (congestive heart failure) (HCC) 07/21/2014   Bilateral pleural effusion 07/21/2014   Mild CAD 07/21/2014   Morbid obesity (HCC) 07/20/2014   UTI (urinary tract infection) 07/20/2014   Demand ischemia (HCC) 07/20/2014   Hypertensive heart failure (HCC) 07/20/2014   OSA on CPAP 07/20/2014   HTN (hypertension) 07/20/2014   Atrial fibrillation with rapid ventricular response (HCC) 07/20/2014   New onset atrial fibrillation (HCC) 07/19/2014    Orientation RESPIRATION BLADDER Height & Weight     Self, Time, Situation, Place  Normal Continent Weight: (!) 438 lb 11.2 oz (199 kg) Height:  5' 9 (175.3 cm)  BEHAVIORAL SYMPTOMS/MOOD NEUROLOGICAL BOWEL NUTRITION STATUS      Continent Diet (Heart Healthy/Carb Modified)  AMBULATORY STATUS COMMUNICATION OF NEEDS Skin   Extensive Assist Verbally Other (Comment) (Wound 05/18/24 Irritant Contact Dermatitis Breast Left;Lower,  Irritant Contact Dermatitis Abdomen Left;Lower;Medial;Right;Mid, Irritant Contact Dermatitis Groin Right;Left;Bilateral, AND Irritant Contact Dermatitis Thigh Right;Left;Bilateral)                       Personal Care Assistance Level of Assistance  Bathing, Feeding, Dressing Bathing Assistance: Maximum assistance Feeding assistance: Limited assistance Dressing Assistance: Maximum assistance     Functional Limitations Info  Sight, Hearing, Speech Sight Info: Impaired (eyeglasses) Hearing Info: Adequate Speech Info: Adequate    SPECIAL CARE FACTORS FREQUENCY  PT (By licensed PT), OT (By licensed OT)     PT Frequency: 5x per week OT  Frequency: 5x per week            Contractures Contractures Info: Not present    Additional Factors Info  Code Status, Allergies Code Status Info: FULL Allergies Info: NKA           Current Medications (05/20/2024):  This is the current hospital active medication list Current  Facility-Administered Medications  Medication Dose Route Frequency Provider Last Rate Last Admin   acetaminophen  (TYLENOL ) tablet 650 mg  650 mg Oral Q8H PRN Dorinda Drue DASEN, MD       cefTRIAXone (ROCEPHIN) 2 g in sodium chloride  0.9 % 100 mL IVPB  2 g Intravenous Q24H Jonel Lonni SQUIBB, MD 200 mL/hr at 05/20/24 0035 2 g at 05/20/24 0035   Chlorhexidine  Gluconate Cloth 2 % PADS 6 each  6 each Topical Daily Jonel Lonni SQUIBB, MD   6 each at 05/18/24 1854   dorzolamide (TRUSOPT) 2 % ophthalmic solution 1 drop  1 drop Both Eyes BID Jonel Lonni SQUIBB, MD   1 drop at 05/20/24 0830   feeding supplement (ENSURE PLUS HIGH PROTEIN) liquid 237 mL  237 mL Oral BID BM Danford, Christopher P, MD   237 mL at 05/20/24 1009   latanoprost (XALATAN) 0.005 % ophthalmic solution 1 drop  1 drop Both Eyes QHS Danford, Lonni SQUIBB, MD   1 drop at 05/19/24 2235   linezolid (ZYVOX) IVPB 600 mg  600 mg Intravenous Q12H Odell Celinda Balo, MD       metoprolol  tartrate (LOPRESSOR ) tablet 25 mg  25 mg Oral BID Odell Celinda Balo, MD   25 mg at 05/20/24 1012   nystatin (MYCOSTATIN/NYSTOP) topical powder   Topical BID Djan, Prince T, MD   Given at 05/20/24 9170   rivaroxaban  (XARELTO ) tablet 20 mg  20 mg Oral Daily Djan, Prince T, MD   20 mg at 05/20/24 0829   rosuvastatin (CRESTOR) tablet 10 mg  10 mg Oral QPM Djan, Prince T, MD   10 mg at 05/19/24 1901   tamsulosin  (FLOMAX ) capsule 0.8 mg  0.8 mg Oral Daily Odell Celinda Balo, MD   0.8 mg at 05/20/24 0829   timolol (TIMOPTIC) 0.5 % ophthalmic solution 1 drop  1 drop Both Eyes BID Danford, Christopher P, MD   1 drop at 05/20/24 0830     Discharge Medications: Please see discharge summary for a list of discharge medications.  Relevant Imaging Results:  Relevant Lab Results:   Additional Information SSN: [id number]  Heather LABOR Keondrick Dilks, LCSW

## 2024-05-20 NOTE — Plan of Care (Signed)

## 2024-05-20 NOTE — Progress Notes (Addendum)
 [facility] PROGRESS NOTE    Progress Note  Lamar FORBES Pamelia Mickey.  FMW:[id number] DOB: [birth year] DOA: 05/17/2024 PCP: Thurmond Cathlyn LABOR., MD     Brief Narrative:   Lamar FORBES Pamelia Mickey. is an 63 y.o. male past medical history of HFpEF, obstructive sleep apnea on CPAP, atrial fibrillation on Xarelto  CAD comes in with generalized weakness, patient resides in an independent living facility transfers and ambulates with a rollator can still drive found to have a possible UTI, with acute kidney injury in the ED on labs    Assessment/Plan:   Sepsis secondary to UTI: Antihypertensive medications were held on admission. Blood cultures negative till date and urine cultures grew E. coli sensitive to Bactrim. He was started on IV Rocephin and IV fluids. Discontinue Foley.  TOC consulted for skilled nursing facility placement. PT evaluated the patient recommended skilled nursing facility.  Acute kidney injury: Baseline around 1.4. Likely prerenal azotemia creatinine returned to baseline with IV fluids and holding antihypertensive medication.  Tinea corporis Continue statin powder.  HFpEF/essential hypertension: Appears hypervolemic on physical exam. Continue to hold losartan Lasix  and Aldactone.   Blood pressure is improving slowly.  Obstructive sleep apnea: Continue CPAP at night.  Paroxysmal atrial fibrillation: Rate controlled holding diltiazem   as blood pressure was borderline. Resume metoprolol . Continue Xarelto .  Glaucoma: Continue eyedrops.  BPH: Continue Flomax  will increase dose.  Morbid obesity: Noted.   DVT prophylaxis: Xarelto  Family Communication:none Status is: Inpatient Remains inpatient appropriate because: Sepsis secondary to UTI    Code Status:     Code Status Orders  (From admission, onward)           Start     Ordered   05/18/24 0118  Full code  Continuous       Question:  By:  Answer:  Consent: discussion documented in EHR   05/18/24 0118            Code Status History     Date Active Date Inactive Code Status Order ID Comments User Context   09/19/2016 0333 09/20/2016 2138 Full Code [id number]  Franky Redia SAILOR, MD Inpatient   07/20/2014 1717 07/22/2014 1914 Full Code [id number]  Darron Deatrice LABOR, MD Inpatient   07/20/2014 0013 07/20/2014 1717 Full Code [id number]  Matthias Cough, MD Inpatient      Advance Directive Documentation    Flowsheet Row Most Recent Value  Type of Advance Directive Living will, Healthcare Power of Attorney  Pre-existing out of facility DNR order (yellow form or pink MOST form) --  MOST Form in Place? --      IV Access:   Peripheral IV   Procedures and diagnostic studies:   ECHOCARDIOGRAM COMPLETE Result Date: 05/18/2024    ECHOCARDIOGRAM REPORT   Patient Name:   Kylon Philbrook. Date of Exam: 05/18/2024 Medical Rec #:  [id number]         Height:       69.0 in Accession #:    [id number]        Weight:       437.0 lb Date of Birth:  May 26, 1961          BSA:          2.880 m Patient Age:    63 years          BP:           100/44 mmHg Patient Gender: M  HR:           98 bpm. Exam Location:  Inpatient Procedure: 2D Echo and Intracardiac Opacification Agent (Both Spectral and Color            Flow Doppler were utilized during procedure). Indications:    Syncope  History:        Patient has no prior history of Echocardiogram examinations.  Sonographer:    Charmaine Gaskins Referring Phys: [id number] PRINCE T DJAN  Sonographer Comments: Technically difficult study due to poor echo windows. Image acquisition challenging due to patient body habitus and 437 lbs. IMPRESSIONS  1. Vigorous LV function with turbulent flow through LV/LVOT No significant obstruction to outflow at rest or with Valsalva. Left ventricular ejection fraction, by estimation, is >75%. The left ventricle has hyperdynamic function. The left ventricle has no regional wall motion abnormalities. There is mild concentric left  ventricular hypertrophy.  2. Right ventricular systolic function is normal. The right ventricular size is normal.  3. The mitral valve is normal in structure. Trivial mitral valve regurgitation.  4. The aortic valve is tricuspid. Aortic valve regurgitation is not visualized. Aortic valve sclerosis/calcification is present, without any evidence of aortic stenosis.  5. The inferior vena cava is dilated in size with <50% respiratory variability, suggesting right atrial pressure of 15 mmHg. FINDINGS  Left Ventricle: Vigorous LV function with turbulent flow through LV/LVOT No significant obstruction to outflow at rest or with Valsalva. Left ventricular ejection fraction, by estimation, is >75%. The left ventricle has hyperdynamic function. The left ventricle has no regional wall motion abnormalities. Definity contrast agent was given IV to delineate the left ventricular endocardial borders. The left ventricular internal cavity size was normal in size. There is mild concentric left ventricular hypertrophy. Right Ventricle: The right ventricular size is normal. Right vetricular wall thickness was not assessed. Right ventricular systolic function is normal. Left Atrium: Left atrial size was normal in size. Right Atrium: Right atrial size was normal in size. Pericardium: Trivial pericardial effusion is present. Mitral Valve: The mitral valve is normal in structure. Trivial mitral valve regurgitation. Tricuspid Valve: The tricuspid valve is normal in structure. Tricuspid valve regurgitation is trivial. Aortic Valve: The aortic valve is tricuspid. Aortic valve regurgitation is not visualized. Aortic valve sclerosis/calcification is present, without any evidence of aortic stenosis. Pulmonic Valve: The pulmonic valve was not well visualized. Aorta: The aortic root and ascending aorta are structurally normal, with no evidence of dilitation. Venous: The inferior vena cava is dilated in size with less than 50% respiratory  variability, suggesting right atrial pressure of 15 mmHg. IAS/Shunts: No atrial level shunt detected by color flow Doppler.  LEFT VENTRICLE PLAX 2D LVIDd:         4.40 cm   Diastology LVIDs:         2.10 cm   LV e' medial:    11.80 cm/s LV PW:         1.20 cm   LV E/e' medial:  7.8 LV IVS:        1.20 cm   LV e' lateral:   11.90 cm/s LVOT diam:     2.50 cm   LV E/e' lateral: 7.8 LVOT Area:     4.91 cm  RIGHT VENTRICLE RV S prime:     21.30 cm/s LEFT ATRIUM           Index LA diam:      4.10 cm 1.42 cm/m LA Vol (A4C): 64.6 ml 22.43 ml/m  AORTA Ao Root diam: 3.20 cm Ao Asc diam:  3.40 cm MITRAL VALVE                TRICUSPID VALVE MV Area (PHT): 6.88 cm     TR Peak grad:   30.9 mmHg MV E velocity: 92.60 cm/s   TR Vmax:        278.00 cm/s MV A velocity: 134.00 cm/s MV E/A ratio:  0.69         SHUNTS                             Systemic Diam: 2.50 cm Vina Gull MD Electronically signed by Vina Gull MD Signature Date/Time: 05/18/2024/4:59:16 PM    Final      Medical Consultants:   None.   Subjective:    Lamar FORBES Pamelia Mickey. feels a lot better than yesterday.  Objective:    Vitals:   05/19/24 1344 05/19/24 2051 05/20/24 0405 05/20/24 0500  BP: 116/60 125/67 113/60   Pulse: 82 83 79   Resp: 16 20 20    Temp: 98.8 F (37.1 C) 99.1 F (37.3 C) 98.6 F (37 C)   TempSrc: Oral Oral Oral   SpO2: 97% 93% 96%   Weight:    (!) 199 kg  Height:       SpO2: 96 % FiO2 (%): 21 %   Intake/Output Summary (Last 24 hours) at 05/20/2024 0936 Last data filed at 05/20/2024 0220 Gross per 24 hour  Intake 1080 ml  Output 1400 ml  Net -320 ml   Filed Weights   05/17/24 2226 05/19/24 0500 05/20/24 0500  Weight: (!) 198.2 kg (!) 198.3 kg (!) 199 kg    Exam: General exam: In no acute distress. Respiratory system: Good air movement and clear to auscultation. Cardiovascular system: S1 & S2 heard, RRR. No JVD. Gastrointestinal system: Abdomen is nondistended, soft and nontender.  Extremities: No  pedal edema. Skin: No rashes, lesions or ulcers Psychiatry: Judgement and insight appear normal. Mood & affect appropriate.  Data Reviewed:    Labs: Basic Metabolic Panel: Recent Labs  Lab 05/17/24 2308 05/18/24 0330  NA 132* 135  K 4.8 4.6  CL 100 103  CO2 20* 20*  GLUCOSE 108* 102*  BUN 56* 54*  CREATININE 1.72* 1.49*  CALCIUM  10.1 9.4   GFR Estimated Creatinine Clearance: 86.4 mL/min (A) (by C-G formula based on SCr of 1.49 mg/dL (H)). Liver Function Tests: Recent Labs  Lab 05/17/24 2308  AST 33  ALT 18  ALKPHOS 78  BILITOT 1.5*  PROT 8.5*  ALBUMIN 3.9   No results for input(s): LIPASE, AMYLASE in the last 168 hours. No results for input(s): AMMONIA in the last 168 hours. Coagulation profile Recent Labs  Lab 05/18/24 0007  INR 1.8*   COVID-19 Labs  No results for input(s): DDIMER, FERRITIN, LDH, CRP in the last 72 hours.  No results found for: SARSCOV2NAA  CBC: Recent Labs  Lab 05/17/24 2308 05/18/24 0330  WBC 11.4* 10.9*  NEUTROABS 9.5*  --   HGB 11.7* 10.9*  HCT 35.8* 33.4*  MCV 96.8 97.1  PLT 194 172   Cardiac Enzymes: No results for input(s): CKTOTAL, CKMB, CKMBINDEX, TROPONINI in the last 168 hours. BNP (last 3 results) Recent Labs    05/17/24 2308  PROBNP 72.0   CBG: No results for input(s): GLUCAP in the last 168 hours. D-Dimer: No results for input(s): DDIMER in the last 72  hours. Hgb A1c: No results for input(s): HGBA1C in the last 72 hours. Lipid Profile: No results for input(s): CHOL, HDL, LDLCALC, TRIG, CHOLHDL, LDLDIRECT in the last 72 hours. Thyroid function studies: No results for input(s): TSH, T4TOTAL, T3FREE, THYROIDAB in the last 72 hours.  Invalid input(s): FREET3 Anemia work up: No results for input(s): VITAMINB12, FOLATE, FERRITIN, TIBC, IRON, RETICCTPCT in the last 72 hours. Sepsis Labs: Recent Labs  Lab 05/17/24 2308 05/17/24 2325  05/18/24 0330 05/18/24 1053 05/18/24 1857  WBC 11.4*  --  10.9*  --   --   LATICACIDVEN  --  1.9  --  0.8 0.9   Microbiology Recent Results (from the past 240 hours)  Blood Culture (routine x 2)     Status: None (Preliminary result)   Collection Time: 05/17/24 11:08 PM   Specimen: Left Antecubital; Blood  Result Value Ref Range Status   Specimen Description   Final    LEFT ANTECUBITAL Performed at [facility], [address] [place] [place], [place] [address]    Special Requests   Final    BOTTLES DRAWN AEROBIC AND ANAEROBIC Blood Culture results may not be optimal due to an inadequate volume of blood received in culture bottles Performed at [facility], [address] [place], [place] [address]    Culture  Setup Time   Final    GRAM POSITIVE COCCI IN CLUSTERS ANAEROBIC BOTTLE ONLY CRITICAL RESULT CALLED TO, READ BACK BY AND VERIFIED WITH: PHARMD NICK GLOGOVAC ON 05/19/24 @ 144 BY DRT GRAM POSITIVE RODS AEROBIC BOTTLE ONLY CRITICAL RESULT CALLED TO, READ BACK BY AND VERIFIED WITH: PHARMD MICHELLE LILLISTON ON 05/19/24 @ 2154 BY DRT    Culture   Final    CULTURE REINCUBATED FOR BETTER GROWTH Performed at [facility], [address], [place], [place] [address]    Report Status PENDING  Incomplete  Urine Culture     Status: Abnormal   Collection Time: 05/17/24 11:08 PM   Specimen: Urine, Random  Result Value Ref Range Status   Specimen Description   Final    URINE, RANDOM Performed at [facility], [address] [place], [place] [address]    Special Requests   Final    NONE Reflexed from [id number] Performed at [facility], [address], [place], [place] [address]    Culture >=100,000 COLONIES/mL ESCHERICHIA COLI (A)  Final   Report Status 05/20/2024 FINAL  Final   Organism ID, Bacteria ESCHERICHIA COLI (A)  Final      Susceptibility   Escherichia coli - MIC*    AMPICILLIN >=32 RESISTANT  Resistant     CEFAZOLIN  (URINE) Value in next row Sensitive      16 SENSITIVEThis is a modified FDA-approved test that has been validated and its performance characteristics determined by the reporting laboratory.  This laboratory is certified under the Clinical Laboratory Improvement Amendments CLIA as qualified to perform high complexity clinical laboratory testing.    CEFEPIME Value in next row Sensitive      16 SENSITIVEThis is a modified FDA-approved test that has been validated and its performance characteristics determined by the reporting laboratory.  This laboratory is certified under the Clinical Laboratory Improvement Amendments CLIA as qualified to perform high complexity clinical laboratory testing.    ERTAPENEM Value in next row Sensitive      16 SENSITIVEThis is a modified FDA-approved test that has been validated and its performance characteristics determined by the reporting laboratory.  This laboratory is certified under the Clinical Laboratory Improvement Amendments CLIA as qualified to perform high complexity clinical laboratory testing.    CEFTRIAXONE  Value in next row Sensitive      16 SENSITIVEThis is a modified FDA-approved test that has been validated and its performance characteristics determined by the reporting laboratory.  This laboratory is certified under the Clinical Laboratory Improvement Amendments CLIA as qualified to perform high complexity clinical laboratory testing.    CIPROFLOXACIN  Value in next row Sensitive      16 SENSITIVEThis is a modified FDA-approved test that has been validated and its performance characteristics determined by the reporting laboratory.  This laboratory is certified under the Clinical Laboratory Improvement Amendments CLIA as qualified to perform high complexity clinical laboratory testing.    GENTAMICIN Value in next row Sensitive      16 SENSITIVEThis is a modified FDA-approved test that has been validated and its performance  characteristics determined by the reporting laboratory.  This laboratory is certified under the Clinical Laboratory Improvement Amendments CLIA as qualified to perform high complexity clinical laboratory testing.    NITROFURANTOIN Value in next row Sensitive      16 SENSITIVEThis is a modified FDA-approved test that has been validated and its performance characteristics determined by the reporting laboratory.  This laboratory is certified under the Clinical Laboratory Improvement Amendments CLIA as qualified to perform high complexity clinical laboratory testing.    TRIMETH /SULFA  Value in next row Sensitive      16 SENSITIVEThis is a modified FDA-approved test that has been validated and its performance characteristics determined by the reporting laboratory.  This laboratory is certified under the Clinical Laboratory Improvement Amendments CLIA as qualified to perform high complexity clinical laboratory testing.    AMPICILLIN/SULBACTAM Value in next row Resistant      16 SENSITIVEThis is a modified FDA-approved test that has been validated and its performance characteristics determined by the reporting laboratory.  This laboratory is certified under the Clinical Laboratory Improvement Amendments CLIA as qualified to perform high complexity clinical laboratory testing.    PIP/TAZO Value in next row Sensitive      <=4 SENSITIVEThis is a modified FDA-approved test that has been validated and its performance characteristics determined by the reporting laboratory.  This laboratory is certified under the Clinical Laboratory Improvement Amendments CLIA as qualified to perform high complexity clinical laboratory testing.    MEROPENEM Value in next row Sensitive      <=4 SENSITIVEThis is a modified FDA-approved test that has been validated and its performance characteristics determined by the reporting laboratory.  This laboratory is certified under the Clinical Laboratory Improvement Amendments CLIA as qualified  to perform high complexity clinical laboratory testing.    * >=100,000 COLONIES/mL ESCHERICHIA COLI  Blood Culture ID Panel (Reflexed)     Status: None   Collection Time: 05/17/24 11:08 PM  Result Value Ref Range Status   Enterococcus faecalis NOT DETECTED NOT DETECTED Final   Enterococcus Faecium NOT DETECTED NOT DETECTED Final   Listeria monocytogenes NOT DETECTED NOT DETECTED Final   Staphylococcus species NOT DETECTED NOT DETECTED Final   Staphylococcus aureus (BCID) NOT DETECTED NOT DETECTED Final   Staphylococcus epidermidis NOT DETECTED NOT DETECTED Final   Staphylococcus lugdunensis NOT DETECTED NOT DETECTED Final   Streptococcus species NOT DETECTED NOT DETECTED Final   Streptococcus agalactiae NOT DETECTED NOT DETECTED Final   Streptococcus pneumoniae NOT DETECTED NOT DETECTED Final   Streptococcus pyogenes NOT DETECTED NOT DETECTED Final   A.calcoaceticus-baumannii NOT DETECTED  NOT DETECTED Final   Bacteroides fragilis NOT DETECTED NOT DETECTED Final   Enterobacterales NOT DETECTED NOT DETECTED Final   Enterobacter cloacae complex NOT DETECTED NOT DETECTED Final   Escherichia coli NOT DETECTED NOT DETECTED Final   Klebsiella aerogenes NOT DETECTED NOT DETECTED Final   Klebsiella oxytoca NOT DETECTED NOT DETECTED Final   Klebsiella pneumoniae NOT DETECTED NOT DETECTED Final   Proteus species NOT DETECTED NOT DETECTED Final   Salmonella species NOT DETECTED NOT DETECTED Final   Serratia marcescens NOT DETECTED NOT DETECTED Final   Haemophilus influenzae NOT DETECTED NOT DETECTED Final   Neisseria meningitidis NOT DETECTED NOT DETECTED Final   Pseudomonas aeruginosa NOT DETECTED NOT DETECTED Final   Stenotrophomonas maltophilia NOT DETECTED NOT DETECTED Final   Candida albicans NOT DETECTED NOT DETECTED Final   Candida auris NOT DETECTED NOT DETECTED Final   Candida glabrata NOT DETECTED NOT DETECTED Final   Candida krusei NOT DETECTED NOT DETECTED Final   Candida  parapsilosis NOT DETECTED NOT DETECTED Final   Candida tropicalis NOT DETECTED NOT DETECTED Final   Cryptococcus neoformans/gattii NOT DETECTED NOT DETECTED Final    Comment: Performed at [facility], [address] [place], [place] [address]  Blood Culture (routine x 2)     Status: None (Preliminary result)   Collection Time: 05/17/24 11:27 PM   Specimen: BLOOD LEFT FOREARM  Result Value Ref Range Status   Specimen Description   Final    BLOOD LEFT FOREARM Performed at [facility], [address], [place], [place] [address]    Special Requests   Final    BOTTLES DRAWN AEROBIC AND ANAEROBIC Blood Culture results may not be optimal due to an inadequate volume of blood received in culture bottles Performed at [facility], [address] [place], [place] [address]    Culture   Final    NO GROWTH 2 DAYS Performed at [facility], [address], [place], [place] [address]    Report Status PENDING  Incomplete     Medications:    Chlorhexidine  Gluconate Cloth  6 each Topical Daily   dorzolamide   1 drop Both Eyes BID   feeding supplement  237 mL Oral BID BM   latanoprost   1 drop Both Eyes QHS   nystatin    Topical BID   rivaroxaban   20 mg Oral Daily   rosuvastatin   10 mg Oral QPM   tamsulosin   0.8 mg Oral Daily   timolol   1 drop Both Eyes BID   Continuous Infusions:  cefTRIAXone  (ROCEPHIN )  IV 2 g (05/20/24 0035)   linezolid  (ZYVOX ) IV        LOS: 2 days   Erle Odell Castor  [facility]  05/20/2024, 9:36 AM

## 2024-05-21 DIAGNOSIS — N179 Acute kidney failure, unspecified: Secondary | ICD-10-CM | POA: Diagnosis not present

## 2024-05-21 DIAGNOSIS — R739 Hyperglycemia, unspecified: Secondary | ICD-10-CM | POA: Diagnosis not present

## 2024-05-21 DIAGNOSIS — L03314 Cellulitis of groin: Secondary | ICD-10-CM | POA: Diagnosis not present

## 2024-05-21 DIAGNOSIS — R55 Syncope and collapse: Secondary | ICD-10-CM | POA: Diagnosis not present

## 2024-05-21 LAB — CREATININE, SERUM
Creatinine, Ser: 0.89 mg/dL (ref 0.61–1.24)
GFR, Estimated: >60 mL/min (ref >60)

## 2024-05-21 MED ORDER — LINEZOLID 600 MG PO TABS
600.0000 mg | ORAL_TABLET | Freq: Two times a day (BID) | ORAL | Status: DC
  Administered 2024-05-21 – 2024-05-22 (×3): 600 mg via ORAL
  Filled 2024-05-21 (×3): qty 1

## 2024-05-21 MED ORDER — ALLOPURINOL 100 MG PO TABS
200.0000 mg | ORAL_TABLET | Freq: Every day | ORAL | Status: DC
  Administered 2024-05-21 – 2024-05-30 (×10): 200 mg via ORAL
  Filled 2024-05-21 (×11): qty 2

## 2024-05-21 MED ORDER — SULFAMETHOXAZOLE-TRIMETHOPRIM 800-160 MG PO TABS
1.0000 | ORAL_TABLET | Freq: Two times a day (BID) | ORAL | Status: DC
  Administered 2024-05-21 – 2024-05-25 (×9): 1 via ORAL
  Filled 2024-05-21 (×9): qty 1

## 2024-05-21 NOTE — Progress Notes (Signed)
   Inpatient Rehab Admissions Coordinator :  Per therapy change in recommendations patient was screened for CIR candidacy by Heron Leavell RN MSN. Patient is not yet at a level to tolerate the intensity required to pursue a CIR admit. CIR beds limited into next week therefore I also recommend other AIR level rehabs be pursued at this time.  Please contact me with any questions.  Heron Leavell RN MSN Admissions Coordinator [phone]

## 2024-05-21 NOTE — Progress Notes (Signed)
   05/21/24 2354  BiPAP/CPAP/SIPAP  BiPAP/CPAP/SIPAP Pt Type Adult  BiPAP/CPAP/SIPAP Resmed  Mask Type Full face mask  Dentures removed? Not applicable  Mask Size Large  Respiratory Rate 18 breaths/min  FiO2 (%) 21 %  Patient Home Machine No  Patient Home Mask No  Patient Home Tubing No  Auto Titrate Yes  Minimum cmH2O 15 cmH2O  Maximum cmH2O 25 cmH2O  Nasal massage performed Yes  Device Plugged into RED Power Outlet Yes  BiPAP/CPAP /SiPAP Vitals  Pulse Rate 70  Resp 18  SpO2 96 %  Bilateral Breath Sounds Diminished

## 2024-05-21 NOTE — Plan of Care (Signed)
  Problem: Education: Goal: Knowledge of General Education information will improve Description: Including pain rating scale, medication(s)/side effects and non-pharmacologic comfort measures 05/21/2024 0352 by Olene Edsel SAILOR, RN Outcome: Progressing 05/21/2024 0340 by Olene Edsel SAILOR, RN Outcome: Progressing   Problem: Health Behavior/Discharge Planning: Goal: Ability to manage health-related needs will improve 05/21/2024 0352 by Olene Edsel SAILOR, RN Outcome: Progressing 05/21/2024 0340 by Olene Edsel SAILOR, RN Outcome: Progressing   Problem: Clinical Measurements: Goal: Ability to maintain clinical measurements within normal limits will improve 05/21/2024 0352 by Olene Edsel SAILOR, RN Outcome: Progressing 05/21/2024 0340 by Olene Edsel SAILOR, RN Outcome: Progressing Goal: Will remain free from infection 05/21/2024 0352 by Olene Edsel SAILOR, RN Outcome: Progressing 05/21/2024 0340 by Olene Edsel SAILOR, RN Outcome: Progressing Goal: Diagnostic test results will improve 05/21/2024 0352 by Olene Edsel SAILOR, RN Outcome: Progressing 05/21/2024 0340 by Olene Edsel SAILOR, RN Outcome: Progressing Goal: Respiratory complications will improve 05/21/2024 0352 by Olene Edsel SAILOR, RN Outcome: Progressing 05/21/2024 0340 by Olene Edsel SAILOR, RN Outcome: Progressing Goal: Cardiovascular complication will be avoided 05/21/2024 0352 by Olene Edsel SAILOR, RN Outcome: Progressing 05/21/2024 0340 by Olene Edsel SAILOR, RN Outcome: Progressing   Problem: Activity: Goal: Risk for activity intolerance will decrease 05/21/2024 0352 by Olene Edsel SAILOR, RN Outcome: Progressing 05/21/2024 0340 by Olene Edsel SAILOR, RN Outcome: Progressing   Problem: Nutrition: Goal: Adequate nutrition will be maintained 05/21/2024 0352 by Olene Edsel SAILOR, RN Outcome: Progressing 05/21/2024 0340 by Olene Edsel SAILOR, RN Outcome: Progressing   Problem: Coping: Goal: Level  of anxiety will decrease 05/21/2024 0352 by Olene Edsel SAILOR, RN Outcome: Progressing 05/21/2024 0340 by Olene Edsel SAILOR, RN Outcome: Progressing   Problem: Elimination: Goal: Will not experience complications related to bowel motility 05/21/2024 0352 by Olene Edsel SAILOR, RN Outcome: Progressing 05/21/2024 0340 by Olene Edsel SAILOR, RN Outcome: Progressing Goal: Will not experience complications related to urinary retention 05/21/2024 0352 by Olene Edsel SAILOR, RN Outcome: Progressing 05/21/2024 0340 by Olene Edsel SAILOR, RN Outcome: Progressing   Problem: Pain Managment: Goal: General experience of comfort will improve and/or be controlled 05/21/2024 0352 by Olene Edsel SAILOR, RN Outcome: Progressing 05/21/2024 0340 by Olene Edsel SAILOR, RN Outcome: Progressing   Problem: Safety: Goal: Ability to remain free from injury will improve 05/21/2024 0352 by Olene Edsel SAILOR, RN Outcome: Progressing 05/21/2024 0340 by Olene Edsel SAILOR, RN Outcome: Progressing   Problem: Skin Integrity: Goal: Risk for impaired skin integrity will decrease 05/21/2024 0352 by Olene Edsel SAILOR, RN Outcome: Progressing 05/21/2024 0340 by Olene Edsel SAILOR, RN Outcome: Progressing   Problem: Clinical Measurements: Goal: Ability to avoid or minimize complications of infection will improve 05/21/2024 0352 by Olene Edsel SAILOR, RN Outcome: Progressing 05/21/2024 0340 by Olene Edsel SAILOR, RN Outcome: Progressing   Problem: Skin Integrity: Goal: Skin integrity will improve 05/21/2024 0352 by Olene Edsel SAILOR, RN Outcome: Progressing 05/21/2024 0340 by Olene Edsel SAILOR, RN Outcome: Progressing

## 2024-05-21 NOTE — Plan of Care (Signed)

## 2024-05-21 NOTE — Progress Notes (Signed)
 [facility] PROGRESS NOTE    Progress Note  Colin FORBES Pamelia Mickey.  FMW:[id number] DOB: 02-02-1961 DOA: 05/17/2024 PCP: Thurmond Cathlyn LABOR., MD     Brief Narrative:   Colin FORBES Pamelia Mickey. is an 63 y.o. male past medical history of HFpEF, obstructive sleep apnea on CPAP, atrial fibrillation on Xarelto  CAD comes in with generalized weakness, patient resides in an independent living facility transfers and ambulates with a rollator can still drive found to have a possible UTI, with acute kidney injury in the ED on labs    Assessment/Plan:   Sepsis secondary to UTI: Antihypertensive medications were held on admission. Blood cultures negative till date and urine cultures grew E. coli sensitive to Bactrim . Transition to to oral Bactrim . Awaiting insurance authorization. PT evaluated the patient recommended skilled nursing facility.  Acute kidney injury: With a baseline creatinine of less than 1 admission 1.4 creatinine returned to baseline with IV fluids and IV antibiotics.  Tinea corporis Continue statin powder.  HFpEF/essential hypertension: Appears hypervolemic on physical exam. Continue to hold losartan Lasix  and Aldactone.   Blood pressure still borderline continue hold diltiazem , losartan Lasix  and Aldactone.  Obstructive sleep apnea: Continue CPAP at night.  Paroxysmal atrial fibrillation: Rate controlled holding diltiazem   as blood pressure was borderline. Continue Toprol  and Xarelto .  Glaucoma: Continue eyedrops.  BPH: Continue Flomax  will increase dose.  Morbid obesity: Noted.   DVT prophylaxis: Xarelto  Family Communication:none Status is: Inpatient Remains inpatient appropriate because: Sepsis secondary to UTI    Code Status:     Code Status Orders  (From admission, onward)           Start     Ordered   05/18/24 0118  Full code  Continuous       Question:  By:  Answer:  Consent: discussion documented in EHR   05/18/24 0118           Code  Status History     Date Active Date Inactive Code Status Order ID Comments User Context   09/19/2016 0333 09/20/2016 2138 Full Code [id number]  Franky Redia SAILOR, MD Inpatient   07/20/2014 1717 07/22/2014 1914 Full Code [id number]  Darron Deatrice LABOR, MD Inpatient   07/20/2014 0013 07/20/2014 1717 Full Code [id number]  Matthias Cough, MD Inpatient      Advance Directive Documentation    Flowsheet Row Most Recent Value  Type of Advance Directive Living will, Healthcare Power of Attorney  Pre-existing out of facility DNR order (yellow form or pink MOST form) --  MOST Form in Place? --      IV Access:   Peripheral IV   Procedures and diagnostic studies:   No results found.    Medical Consultants:   None.   Subjective:    Colin FORBES Pamelia Mickey. No complains this morning.  Objective:    Vitals:   05/20/24 1314 05/20/24 1949 05/21/24 0458 05/21/24 0500  BP: 118/69 109/61 (!) 96/49   Pulse: 70 78 75   Resp: 18     Temp: 98.8 F (37.1 C) 98.6 F (37 C) 98.8 F (37.1 C)   TempSrc: Oral Oral Oral   SpO2: 96% 98% 97%   Weight:    (!) 201 kg  Height:       SpO2: 97 % FiO2 (%): 21 %   Intake/Output Summary (Last 24 hours) at 05/21/2024 0821 Last data filed at 05/21/2024 0332 Gross per 24 hour  Intake 1700 ml  Output 500 ml  Net 1200 ml  Filed Weights   05/19/24 0500 05/20/24 0500 05/21/24 0500  Weight: (!) 198.3 kg (!) 199 kg (!) 201 kg    Exam: General exam: In no acute distress. Respiratory system: Good air movement and clear to auscultation. Cardiovascular system: S1 & S2 heard, RRR. No JVD. Gastrointestinal system: Abdomen is nondistended, soft and nontender.  Extremities: No pedal edema. Skin: No rashes, lesions or ulcers Psychiatry: Judgement and insight appear normal. Mood & affect appropriate.  Data Reviewed:    Labs: Basic Metabolic Panel: Recent Labs  Lab 05/17/24 2308 05/18/24 0330 05/20/24 1207 05/21/24 0406  NA 132* 135 138  --    K 4.8 4.6 3.9  --   CL 100 103 109  --   CO2 20* 20* 21*  --   GLUCOSE 108* 102* 125*  --   BUN 56* 54* 30*  --   CREATININE 1.72* 1.49* 0.99 0.89  CALCIUM  10.1 9.4 8.5*  --    GFR Estimated Creatinine Clearance: 145.6 mL/min (by C-G formula based on SCr of 0.89 mg/dL). Liver Function Tests: Recent Labs  Lab 05/17/24 2308  AST 33  ALT 18  ALKPHOS 78  BILITOT 1.5*  PROT 8.5*  ALBUMIN 3.9   No results for input(s): LIPASE, AMYLASE in the last 168 hours. No results for input(s): AMMONIA in the last 168 hours. Coagulation profile Recent Labs  Lab 05/18/24 0007  INR 1.8*   COVID-19 Labs  No results for input(s): DDIMER, FERRITIN, LDH, CRP in the last 72 hours.  No results found for: SARSCOV2NAA  CBC: Recent Labs  Lab 05/17/24 2308 05/18/24 0330  WBC 11.4* 10.9*  NEUTROABS 9.5*  --   HGB 11.7* 10.9*  HCT 35.8* 33.4*  MCV 96.8 97.1  PLT 194 172   Cardiac Enzymes: No results for input(s): CKTOTAL, CKMB, CKMBINDEX, TROPONINI in the last 168 hours. BNP (last 3 results) Recent Labs    05/17/24 2308  PROBNP 72.0   CBG: No results for input(s): GLUCAP in the last 168 hours. D-Dimer: No results for input(s): DDIMER in the last 72 hours. Hgb A1c: No results for input(s): HGBA1C in the last 72 hours. Lipid Profile: No results for input(s): CHOL, HDL, LDLCALC, TRIG, CHOLHDL, LDLDIRECT in the last 72 hours. Thyroid function studies: No results for input(s): TSH, T4TOTAL, T3FREE, THYROIDAB in the last 72 hours.  Invalid input(s): FREET3 Anemia work up: No results for input(s): VITAMINB12, FOLATE, FERRITIN, TIBC, IRON, RETICCTPCT in the last 72 hours. Sepsis Labs: Recent Labs  Lab 05/17/24 2308 05/17/24 2325 05/18/24 0330 05/18/24 1053 05/18/24 1857  WBC 11.4*  --  10.9*  --   --   LATICACIDVEN  --  1.9  --  0.8 0.9   Microbiology Recent Results (from the past 240 hours)  Blood Culture  (routine x 2)     Status: None (Preliminary result)   Collection Time: 05/17/24 11:08 PM   Specimen: Left Antecubital; Blood  Result Value Ref Range Status   Specimen Description   Final    LEFT ANTECUBITAL Performed at [facility], [address] [place], [place] [address]    Special Requests   Final    BOTTLES DRAWN AEROBIC AND ANAEROBIC Blood Culture results may not be optimal due to an inadequate volume of blood received in culture bottles Performed at [facility], [address], [place], [place] [address]    Culture  Setup Time   Final    GRAM POSITIVE COCCI IN CLUSTERS ANAEROBIC BOTTLE ONLY  CRITICAL RESULT CALLED TO, READ BACK BY AND VERIFIED WITH: PHARMD NICK GLOGOVAC ON 05/19/24 @ 144 BY DRT GRAM POSITIVE RODS AEROBIC BOTTLE ONLY CRITICAL RESULT CALLED TO, READ BACK BY AND VERIFIED WITH: PHARMD MICHELLE LILLISTON ON 05/19/24 @ 2154 BY DRT    Culture   Final    CULTURE REINCUBATED FOR BETTER GROWTH Performed at [facility], [address] [place], [place] [address]    Report Status PENDING  Incomplete  Urine Culture     Status: Abnormal   Collection Time: 05/17/24 11:08 PM   Specimen: Urine, Random  Result Value Ref Range Status   Specimen Description   Final    URINE, RANDOM Performed at [facility], [address], [place], [place] [address]    Special Requests   Final    NONE Reflexed from [id number] Performed at [facility], [address], [place], [place] [address]    Culture >=100,000 COLONIES/mL ESCHERICHIA COLI (A)  Final   Report Status 05/20/2024 FINAL  Final   Organism ID, Bacteria ESCHERICHIA COLI (A)  Final      Susceptibility   Escherichia coli - MIC*    AMPICILLIN >=32 RESISTANT Resistant     CEFAZOLIN  (URINE) Value in next row Sensitive      16 SENSITIVEThis is a modified FDA-approved test that has been validated and its performance characteristics determined by  the reporting laboratory.  This laboratory is certified under the Clinical Laboratory Improvement Amendments CLIA as qualified to perform high complexity clinical laboratory testing.    CEFEPIME Value in next row Sensitive      16 SENSITIVEThis is a modified FDA-approved test that has been validated and its performance characteristics determined by the reporting laboratory.  This laboratory is certified under the Clinical Laboratory Improvement Amendments CLIA as qualified to perform high complexity clinical laboratory testing.    ERTAPENEM Value in next row Sensitive      16 SENSITIVEThis is a modified FDA-approved test that has been validated and its performance characteristics determined by the reporting laboratory.  This laboratory is certified under the Clinical Laboratory Improvement Amendments CLIA as qualified to perform high complexity clinical laboratory testing.    CEFTRIAXONE  Value in next row Sensitive      16 SENSITIVEThis is a modified FDA-approved test that has been validated and its performance characteristics determined by the reporting laboratory.  This laboratory is certified under the Clinical Laboratory Improvement Amendments CLIA as qualified to perform high complexity clinical laboratory testing.    CIPROFLOXACIN  Value in next row Sensitive      16 SENSITIVEThis is a modified FDA-approved test that has been validated and its performance characteristics determined by the reporting laboratory.  This laboratory is certified under the Clinical Laboratory Improvement Amendments CLIA as qualified to perform high complexity clinical laboratory testing.    GENTAMICIN Value in next row Sensitive      16 SENSITIVEThis is a modified FDA-approved test that has been validated and its performance characteristics determined by the reporting laboratory.  This laboratory is certified under the Clinical Laboratory Improvement Amendments CLIA as qualified to perform high complexity clinical laboratory  testing.    NITROFURANTOIN Value in next row Sensitive      16 SENSITIVEThis is a modified FDA-approved test that has been validated and its performance characteristics determined by the reporting laboratory.  This laboratory is certified under the Clinical Laboratory Improvement Amendments CLIA as qualified to perform high complexity clinical laboratory testing.  TRIMETH /SULFA  Value in next row Sensitive      16 SENSITIVEThis is a modified FDA-approved test that has been validated and its performance characteristics determined by the reporting laboratory.  This laboratory is certified under the Clinical Laboratory Improvement Amendments CLIA as qualified to perform high complexity clinical laboratory testing.    AMPICILLIN/SULBACTAM Value in next row Resistant      16 SENSITIVEThis is a modified FDA-approved test that has been validated and its performance characteristics determined by the reporting laboratory.  This laboratory is certified under the Clinical Laboratory Improvement Amendments CLIA as qualified to perform high complexity clinical laboratory testing.    PIP/TAZO Value in next row Sensitive      <=4 SENSITIVEThis is a modified FDA-approved test that has been validated and its performance characteristics determined by the reporting laboratory.  This laboratory is certified under the Clinical Laboratory Improvement Amendments CLIA as qualified to perform high complexity clinical laboratory testing.    MEROPENEM Value in next row Sensitive      <=4 SENSITIVEThis is a modified FDA-approved test that has been validated and its performance characteristics determined by the reporting laboratory.  This laboratory is certified under the Clinical Laboratory Improvement Amendments CLIA as qualified to perform high complexity clinical laboratory testing.    * >=100,000 COLONIES/mL ESCHERICHIA COLI  Blood Culture ID Panel (Reflexed)     Status: None   Collection Time: 05/17/24 11:08 PM  Result  Value Ref Range Status   Enterococcus faecalis NOT DETECTED NOT DETECTED Final   Enterococcus Faecium NOT DETECTED NOT DETECTED Final   Listeria monocytogenes NOT DETECTED NOT DETECTED Final   Staphylococcus species NOT DETECTED NOT DETECTED Final   Staphylococcus aureus (BCID) NOT DETECTED NOT DETECTED Final   Staphylococcus epidermidis NOT DETECTED NOT DETECTED Final   Staphylococcus lugdunensis NOT DETECTED NOT DETECTED Final   Streptococcus species NOT DETECTED NOT DETECTED Final   Streptococcus agalactiae NOT DETECTED NOT DETECTED Final   Streptococcus pneumoniae NOT DETECTED NOT DETECTED Final   Streptococcus pyogenes NOT DETECTED NOT DETECTED Final   A.calcoaceticus-baumannii NOT DETECTED NOT DETECTED Final   Bacteroides fragilis NOT DETECTED NOT DETECTED Final   Enterobacterales NOT DETECTED NOT DETECTED Final   Enterobacter cloacae complex NOT DETECTED NOT DETECTED Final   Escherichia coli NOT DETECTED NOT DETECTED Final   Klebsiella aerogenes NOT DETECTED NOT DETECTED Final   Klebsiella oxytoca NOT DETECTED NOT DETECTED Final   Klebsiella pneumoniae NOT DETECTED NOT DETECTED Final   Proteus species NOT DETECTED NOT DETECTED Final   Salmonella species NOT DETECTED NOT DETECTED Final   Serratia marcescens NOT DETECTED NOT DETECTED Final   Haemophilus influenzae NOT DETECTED NOT DETECTED Final   Neisseria meningitidis NOT DETECTED NOT DETECTED Final   Pseudomonas aeruginosa NOT DETECTED NOT DETECTED Final   Stenotrophomonas maltophilia NOT DETECTED NOT DETECTED Final   Candida albicans NOT DETECTED NOT DETECTED Final   Candida auris NOT DETECTED NOT DETECTED Final   Candida glabrata NOT DETECTED NOT DETECTED Final   Candida krusei NOT DETECTED NOT DETECTED Final   Candida parapsilosis NOT DETECTED NOT DETECTED Final   Candida tropicalis NOT DETECTED NOT DETECTED Final   Cryptococcus neoformans/gattii NOT DETECTED NOT DETECTED Final    Comment: Performed at [facility], [address] [place], [place] [address]  Blood Culture (routine x 2)     Status: None (Preliminary result)   Collection Time: 05/17/24 11:27 PM   Specimen: BLOOD LEFT FOREARM  Result Value Ref Range Status  Specimen Description   Final    BLOOD LEFT FOREARM Performed at [facility], [address] [place], [place] [address]    Special Requests   Final    BOTTLES DRAWN AEROBIC AND ANAEROBIC Blood Culture results may not be optimal due to an inadequate volume of blood received in culture bottles Performed at [facility], [address] [place], [place] [address]    Culture   Final    NO GROWTH 3 DAYS Performed at [facility], [address] [place], [place] [address]    Report Status PENDING  Incomplete     Medications:    Chlorhexidine  Gluconate Cloth  6 each Topical Daily   dorzolamide   1 drop Both Eyes BID   feeding supplement  237 mL Oral BID BM   latanoprost   1 drop Both Eyes QHS   linezolid   600 mg Oral Q12H   metoprolol  tartrate  25 mg Oral BID   nystatin    Topical BID   rivaroxaban   20 mg Oral Daily   rosuvastatin   10 mg Oral QPM   tamsulosin   0.8 mg Oral Daily   timolol   1 drop Both Eyes BID   Continuous Infusions:  cefTRIAXone  (ROCEPHIN )  IV Stopped (05/21/24 0127)      LOS: 3 days   Erle Odell Castor  [facility]  05/21/2024, 8:21 AM

## 2024-05-21 NOTE — TOC Progression Note (Addendum)
 Transition of Care (TOC) - Progression Note    Patient Details  Name: Colin Hall. MRN: [id number] Date of Birth: [birth year]  Transition [facility]) CM/SW Contact  Tawni CHRISTELLA Eva, LCSW Phone Number: 05/21/2024, 9:42 AM  Clinical Narrative:     Pt will no bed offers for SNF, CSW has reached out to facilities that are considering for clinical review. ICM to follow.   Adden  2:00pm CSW spoke with pt to discuss rec for AIR. Pt is agreeable for referral to be sent out for AIR facilities to review. ICM to follow.   Expected Discharge Plan: Skilled Nursing Facility Barriers to Discharge: Continued Medical Work up               Expected Discharge Plan and Services       Living arrangements for the past 2 months: Single Family Home                                       Social Drivers of Health (SDOH) Interventions SDOH Screenings   Food Insecurity: No Food Insecurity (05/18/2024)  Housing: Low Risk  (05/18/2024)  Transportation Needs: No Transportation Needs (05/18/2024)  Utilities: Not At Risk (05/18/2024)  Tobacco Use: Low Risk  (05/17/2024)    Readmission Risk Interventions     No data to display

## 2024-05-21 NOTE — Progress Notes (Addendum)
 Physical Therapy Treatment Patient Details Name: Colin Hall. MRN: [id number] DOB: 1961/04/25 Today's Date: 05/21/2024   History of Present Illness Pt is a 63 y/o M admitted on 05/17/24 after presenting with c/o generalized weakness. Pt is being treated for sepsis due to UTI. PMH: morbid obesity, dCHF, OSA on CPAP, AF on xarelto , HTN, CAD    PT Comments  Pt making excellent progress today with goals met and updated.  Initial stand requiring heavy assist of 2 plus RW stabilized; however, once better positioned standing from elevated bed with min A of 2.  Pt standing for several mins and participating with pre-gait activities.  With good progress -updated recommendation to Patient will benefit from intensive inpatient follow-up therapy, >3 hours/day at d/c. Pt motivated, participating, making progress, and was mod I and ambulatory baseline.   Also, request bariatric recliner from [facility] and spoke to charge nurse about getting pt moved to [facility] room so that he could be up to recliner.  Pt did have some upward rotational nystagmus with head turned right when bed in trendlenberg for repositioning that lasted <20 sec with + vertigo.  Consistent with BPPV.  Pt does not have symptoms with rolling , transfers, or head turns.  Will continue to monitor and treat as able but with pt's limited mobility modified Epley will be difficult and pt not having symptoms with basic mobility (was only noted in trendlenberg).    If plan is discharge home, recommend the following: Two people to help with walking and/or transfers;Two people to help with bathing/dressing/bathroom;Assistance with cooking/housework;Help with stairs or ramp for entrance   Can travel by private vehicle     No  Equipment Recommendations  Hospital bed;Hoyer lift;Wheelchair cushion (measurements PT);Wheelchair (measurements PT) (defer to next venue for assessment with progress)    Recommendations for Other Services Rehab consult      Precautions / Restrictions Precautions Precautions: Fall     Mobility  Bed Mobility Overal bed mobility: Needs Assistance Bed Mobility: Rolling, Supine to Sit, Sit to Supine Rolling: Mod assist, +2 for physical assistance   Supine to sit: Min assist, +2 for safety/equipment Sit to supine: Mod assist, +2 for physical assistance   General bed mobility comments: To Sit: pt sliding legs off bed, partial roll to rail , then pushing self up needing light min A of 2 for safety.  To supine:  Had mod A of 3 -to assist for legs back on bed and to turn trunk.  Pt helping to reposition with bed in trendlenberg and mod A x 3 using maxi-slide.  Rolling with mod x 2.  Good effort from pt.    Transfers Overall transfer level: Needs assistance Equipment used:  (bariatric RW) Transfers: Sit to/from Stand Sit to Stand: Min assist, +2 physical assistance, From elevated surface, Mod assist           General transfer comment: Initial STS with Mod A x 3 - PT and OT assisting with belt and pad while tech stabilized RW and blocked feet.  Required 2 attempts. Was more difficult due to difficulty getting feet in good placement while sitting - R knee extended and hip ER.   Once standing pt able to get feet in position under body.  He stood several mins and returned to sitting with controlled descent.  Feet where then positioned flat on floor and under body.  Pt stood again this time with min A x 2 from elevated bed.    Ambulation/Gait  Pre-gait activities: Standing 8-10 mins with CGA x 2 for safety.  Worked on weight shifting, small knee bends, stepping backward to bed - pt with improved steps when cued to stand straight and push into walker.  Steps backward to bed were shuffled - not stable enough to pivot or move away from bed.     Stairs             Wheelchair Mobility     Tilt Bed    Modified Rankin (Stroke Patients Only)       Balance Overall balance assessment: Needs  assistance Sitting-balance support: Feet supported, No upper extremity supported, Bilateral upper extremity supported Sitting balance-Leahy Scale: Fair Sitting balance - Comments: Initially requiring at least single UE support and leaning R - tilted bed slight reverse trendlenberg to get pt to lean L with some improvement, but still needed UE support.  Bed also deflated.  After initial stand with return to sit could maintain without UE support due to better positioning   Standing balance support: Bilateral upper extremity supported Standing balance-Leahy Scale: Poor Standing balance comment: Steady with RW and CGA, stood 8-10 mins tolerating well                            Communication    Cognition Arousal: Alert Behavior During Therapy: WFL for tasks assessed/performed   PT - Cognitive impairments: No apparent impairments                       PT - Cognition Comments: pleasant and motivated Following commands: Intact      Cueing    Exercises General Exercises - Lower Extremity Ankle Circles/Pumps: AROM, Both, 5 reps, Supine Quad Sets: AROM, Both, 5 reps, Supine Heel Slides: AROM, Both, 5 reps, Supine Hip ABduction/ADduction: AROM, Both, 5 reps, Supine    General Comments General comments (skin integrity, edema, etc.): Called [facility] for bariatric recliner at end of session, but also discussed with charge nurse getting pt moved to [facility] room as will have difficulty getting up from low recliner.      Pertinent Vitals/Pain Pain Assessment Pain Assessment: No/denies pain    Home Living                          Prior Function            PT Goals (current goals can now be found in the care plan section) Acute Rehab PT Goals Patient Stated Goal: get better PT Goal Formulation: With patient Time For Goal Achievement: 06/04/24 Potential to Achieve Goals: Good Progress towards PT goals: Goals met and updated - see care plan     Frequency    Min 2X/week      PT Plan      Co-evaluation              AM-PAC PT 6 Clicks Mobility   Outcome Measure  Help needed turning from your back to your side while in a flat bed without using bedrails?: A Lot Help needed moving from lying on your back to sitting on the side of a flat bed without using bedrails?: A Lot Help needed moving to and from a bed to a chair (including a wheelchair)?: Total Help needed standing up from a chair using your arms (e.g., wheelchair or bedside chair)?: A Lot Help needed to walk in hospital room?: Total Help needed climbing 3-5  steps with a railing? : Total 6 Click Score: 9    End of Session Equipment Utilized During Treatment: Gait belt Activity Tolerance: Patient tolerated treatment well Patient left: in bed;with call bell/phone within reach (no alarm on specialty bed, pt following commands; mattress inflated and rotation resumed, rails up) Nurse Communication: Mobility status PT Visit Diagnosis: Muscle weakness (generalized) (M62.81);Difficulty in walking, not elsewhere classified (R26.2)     Time: 8857-8782 PT Time Calculation (min) (ACUTE ONLY): 35 min  Charges:    $Therapeutic Activity: 8-22 mins PT General Charges $$ ACUTE PT VISIT: 1 Visit                     Benjiman, PT Acute Rehab [facility] [phone]    Benjiman VEAR Mulberry 05/21/2024, 1:01 PM

## 2024-05-21 NOTE — Progress Notes (Signed)
 Occupational Therapy Treatment Patient Details Name: Colin Hall. MRN: [id number] DOB: [birth year] Today's Date: 05/21/2024   History of present illness Pt is a 63 y/o M admitted on 05/17/24 after presenting with c/o generalized weakness. Pt is being treated for sepsis due to UTI. PMH: morbid obesity, dCHF, OSA on CPAP, AF on xarelto , HTN, CAD   OT comments  Patient seen in conjunction with PT in order to advance with functional mobility and overall independence. Patient with marked improvement in comparision to eval, requiring mod A of 2 for bed mobility, and then able to complete sit<>stands x2 to increased activity tolerance. Patient requiring increased assist for first stand (mod A of 2) due to poor positioning of RLE, however able to complete at min A of 2 on second attempt. Patient also able to take a few steps backwards in session. Given improvement, OT recommending intensive rehab > 3 hours prior to discharge home. OT will continue to follow acutely.      If plan is discharge home, recommend the following:  Two people to help with walking and/or transfers;Two people to help with bathing/dressing/bathroom;Help with stairs or ramp for entrance;Assist for transportation   Equipment Recommendations  Other (comment) (defer to next venue)    Recommendations for Other Services Rehab consult    Precautions / Restrictions Precautions Precautions: Fall Recall of Precautions/Restrictions: Intact       Mobility Bed Mobility Overal bed mobility: Needs Assistance Bed Mobility: Rolling, Supine to Sit, Sit to Supine Rolling: Mod assist, +2 for physical assistance   Supine to sit: Min assist, +2 for safety/equipment Sit to supine: Mod assist, +2 for physical assistance   General bed mobility comments: To Sit: pt sliding legs off bed, partial roll to rail , then pushing self up needing light min A of 2 for safety.  To supine:  Had mod A of 3 -to assist for legs back on bed and to turn  trunk.  Pt helping to reposition with bed in trendlenberg and mod A x 3 using maxi-slide.  Rolling with mod x 2.  Good effort from pt.    Transfers Overall transfer level: Needs assistance Equipment used:  (bariatric RW) Transfers: Sit to/from Stand Sit to Stand: Min assist, +2 physical assistance, From elevated surface, Mod assist           General transfer comment: Initial STS with Mod A x 3 - PT and OT assisting with belt and pad while tech stabilized RW and blocked feet.  Required 2 attempts. Was more difficult due to difficulty getting feet in good placement while sitting - R knee extended and hip ER.   Once standing pt able to get feet in position under body.  He stood several mins and returned to sitting with controlled descent.  Feet where then positioned flat on floor and under body.  Pt stood again this time with min A x 2 from elevated bed.     Balance Overall balance assessment: Needs assistance Sitting-balance support: Feet supported, No upper extremity supported, Bilateral upper extremity supported Sitting balance-Leahy Scale: Fair Sitting balance - Comments: Initially requiring at least single UE support and leaning R - tilted bed slight reverse trendlenberg to get pt to lean L with some improvement, but still needed UE support.  Bed also deflated.  After initial stand with return to sit could maintain without UE support due to better positioning   Standing balance support: Bilateral upper extremity supported Standing balance-Leahy Scale: Poor Standing balance comment: Steady  with RW and CGA, stood 8-10 mins tolerating well                           ADL either performed or assessed with clinical judgement   ADL Overall ADL's : Needs assistance/impaired                     Lower Body Dressing: Total assistance;Bed level   Toilet Transfer: Maximal assistance;Moderate assistance;+2 for physical assistance;+2 for safety/equipment            Functional mobility during ADLs: Moderate assistance;+2 for physical assistance;+2 for safety/equipment;Cueing for sequencing;Cueing for safety;Rolling walker (2 wheels) General ADL Comments: Patient seen in conjunction with PT in order to advance with functional mobility and overall independence. Patient with marked improvement in comparision to eval, requiring mod A of 2 for bed mobility, and then able to complete sit<>stands x2 to increased activity tolerance. Patient requiring increased assist for first stand (mod A of 2) due to poor positioning of RLE, however able to complete at min A of 2 on second attempt. Patient also able to take a few steps backwards in session. Given improvement, OT recommending intensive rehab > 3 hours prior to discharge home. OT will continue to follow acutely.    Extremity/Trunk Assessment              Vision       Perception     Praxis     Communication     Cognition Arousal: Alert Behavior During Therapy: WFL for tasks assessed/performed Cognition: No apparent impairments                               Following commands: Intact        Cueing      Exercises      Shoulder Instructions       General Comments Called portables for bariatric recliner at end of session, but also discussed with charge nurse getting pt moved to [facility] room as will have difficulty getting up from low recliner.    Pertinent Vitals/ Pain       Pain Assessment Pain Assessment: No/denies pain  Home Living                                          Prior Functioning/Environment              Frequency  Min 2X/week        Progress Toward Goals  OT Goals(current goals can now be found in the care plan section)  Progress towards OT goals: Progressing toward goals  Acute Rehab OT Goals Patient Stated Goal: to get better OT Goal Formulation: With patient Time For Goal Achievement: 06/02/24 Potential to Achieve Goals:  Good  Plan      Co-evaluation      Reason for Co-Treatment: For patient/therapist safety PT goals addressed during session: Mobility/safety with mobility;Balance OT goals addressed during session: Proper use of Adaptive equipment and DME;Strengthening/ROM;ADL's and self-care      AM-PAC OT 6 Clicks Daily Activity     Outcome Measure   Help from another person eating meals?: A Little Help from another person taking care of personal grooming?: A Little Help from another person toileting, which includes using toliet, bedpan, or urinal?: Total  Help from another person bathing (including washing, rinsing, drying)?: A Lot Help from another person to put on and taking off regular upper body clothing?: A Little Help from another person to put on and taking off regular lower body clothing?: Total 6 Click Score: 13    End of Session Equipment Utilized During Treatment: Gait belt;Rolling walker (2 wheels)  OT Visit Diagnosis: Unsteadiness on feet (R26.81);Other abnormalities of gait and mobility (R26.89);Muscle weakness (generalized) (M62.81)   Activity Tolerance Patient tolerated treatment well   Patient Left in bed;with call bell/phone within reach   Nurse Communication Mobility status        Time: 8857-8781 OT Time Calculation (min): 36 min  Charges: OT General Charges $OT Visit: 1 Visit OT Treatments $Self Care/Home Management : 8-22 mins  Ronal Gift E. Mardi Cannady, OTR/L Acute Rehabilitation Services [phone]   Ronal Gift Salt 05/21/2024, 2:55 PM

## 2024-05-22 DIAGNOSIS — R55 Syncope and collapse: Secondary | ICD-10-CM | POA: Diagnosis not present

## 2024-05-22 LAB — BASIC METABOLIC PANEL WITH GFR
Anion gap: 6 (ref 5–15)
BUN: 20 mg/dL (ref 8–23)
CO2: 24 mmol/L (ref 22–32)
Calcium: 8.4 mg/dL — ABNORMAL LOW (ref 8.9–10.3)
Chloride: 106 mmol/L (ref 98–111)
Creatinine, Ser: 1 mg/dL (ref 0.61–1.24)
GFR, Estimated: >60 mL/min (ref >60)
Glucose, Bld: 98 mg/dL (ref 70–99)
Potassium: 4.4 mmol/L (ref 3.5–5.1)
Sodium: 136 mmol/L (ref 135–145)

## 2024-05-22 MED ORDER — FUROSEMIDE 20 MG PO TABS: 20.0000 mg | ORAL_TABLET | Freq: Every day | ORAL | Status: DC | PRN

## 2024-05-22 NOTE — TOC Progression Note (Signed)
 Transition of Care (TOC) - Progression Note    Patient Details  Name: Colin Hall. MRN: [id number] Date of Birth: [birth year]  Transition of Care [facility]) CM/SW Contact  Sonda Manuella Quill, RN Phone Number: 05/22/2024, 11:37 AM  Clinical Narrative:    Received call from Orlean Balder Inpatient Rehab Liaison; she requested update on tentative date of discharge; she can be contacted at [phone]; Dr Celinda notified via secure chat.  [id number] Orlean notified pt ready for d/c per Dr Celinda; she will contact pt and start ins auth today; IP CM is following.   Expected Discharge Plan: Skilled Nursing Facility Barriers to Discharge: Continued Medical Work up               Expected Discharge Plan and Services       Living arrangements for the past 2 months: Single Family Home                                       Social Drivers of Health (SDOH) Interventions SDOH Screenings   Food Insecurity: No Food Insecurity (05/18/2024)  Housing: Low Risk  (05/18/2024)  Transportation Needs: No Transportation Needs (05/18/2024)  Utilities: Not At Risk (05/18/2024)  Tobacco Use: Low Risk  (05/17/2024)    Readmission Risk Interventions     No data to display

## 2024-05-22 NOTE — Progress Notes (Signed)
 [facility] PROGRESS NOTE    Progress Note  Colin Hall.  FMW:[id number] DOB: [birth year] DOA: 05/17/2024 PCP: Thurmond Cathlyn LABOR., MD     Brief Narrative:   Colin Hall. is an 63 y.o. male past medical history of HFpEF, obstructive sleep apnea on CPAP, atrial fibrillation on Xarelto  CAD comes in with generalized weakness, patient resides in an independent living facility transfers and ambulates with a rollator can still drive found to have a possible UTI, with acute kidney injury in the ED on labs.  Assessment/Plan:   Sepsis secondary to UTI: Antihypertensive medications were held on admission. Blood cultures 1 out of 2 grew [facility] which is likely a contaminant.   Urine cultures grew E. coli sensitive to Bactrim . Transition to to oral Bactrim . PT evaluated the patient recommended skilled nursing facility.  Denied by inpatient rehab. Awaiting skilled nursing facility placement.  Acute kidney injury: With a baseline creatinine of less than 1 admission 1.4 creatinine returned to baseline with IV fluids and IV antibiotics.  Tinea corporis Continue statin powder.  HFpEF/essential hypertension: Appears hypervolemic on physical exam. Continue to hold losartan Lasix  and Aldactone.   Blood pressure still borderline continue hold diltiazem , losartan Lasix  and Aldactone.  Obstructive sleep apnea: Continue CPAP at night.  Paroxysmal atrial fibrillation: Rate controlled holding diltiazem   as blood pressure was borderline. Continue Toprol  and Xarelto .  Glaucoma: Continue eyedrops.  BPH: Continue Flomax  will increase dose.  Morbid obesity: Noted.   DVT prophylaxis: Xarelto  Family Communication:none Status is: Inpatient Remains inpatient appropriate because: Sepsis secondary to UTI    Code Status:     Code Status Orders  (From admission, onward)           Start     Ordered   05/18/24 0118  Full code  Continuous       Question:  By:  Answer:   Consent: discussion documented in EHR   05/18/24 0118           Code Status History     Date Active Date Inactive Code Status Order ID Comments User Context   09/19/2016 0333 09/20/2016 2138 Full Code [id number]  Franky Redia SAILOR, MD Inpatient   07/20/2014 1717 07/22/2014 1914 Full Code [id number]  Darron Deatrice LABOR, MD Inpatient   07/20/2014 0013 07/20/2014 1717 Full Code [id number]  Matthias Cough, MD Inpatient      Advance Directive Documentation    Flowsheet Row Most Recent Value  Type of Advance Directive Living will, Healthcare Power of Attorney  Pre-existing out of facility DNR order (yellow form or pink MOST form) --  MOST Form in Place? --      IV Access:   Peripheral IV   Procedures and diagnostic studies:   No results found.    Medical Consultants:   None.   Subjective:    Colin Hall. no complaints.  Objective:    Vitals:   05/21/24 2017 05/21/24 2354 05/22/24 0457 05/22/24 0500  BP: 118/69  (!) 113/59   Pulse: 81 70 79   Resp: (!) 24 18 20    Temp: 97.9 F (36.6 C)  98.7 F (37.1 C)   TempSrc: Oral  Oral   SpO2: 98% 96% 97%   Weight:    (!) 195.5 kg  Height:       SpO2: 97 % FiO2 (%): 21 %   Intake/Output Summary (Last 24 hours) at 05/22/2024 0834 Last data filed at 05/22/2024 0500 Gross per 24 hour  Intake  600 ml  Output 2300 ml  Net -1700 ml   Filed Weights   05/20/24 0500 05/21/24 0500 05/22/24 0500  Weight: (!) 199 kg (!) 201 kg (!) 195.5 kg    Exam: General exam: In no acute distress. Respiratory system: Good air movement and clear to auscultation. Cardiovascular system: S1 & S2 heard, RRR. No JVD. Gastrointestinal system: Abdomen is nondistended, soft and nontender.  Extremities: No pedal edema. Skin: No rashes, lesions or ulcers Psychiatry: Judgement and insight appear normal. Mood & affect appropriate.  Data Reviewed:    Labs: Basic Metabolic Panel: Recent Labs  Lab 05/17/24 2308 05/18/24 0330  05/20/24 1207 05/21/24 0406  NA 132* 135 138  --   K 4.8 4.6 3.9  --   CL 100 103 109  --   CO2 20* 20* 21*  --   GLUCOSE 108* 102* 125*  --   BUN 56* 54* 30*  --   CREATININE 1.72* 1.49* 0.99 0.89  CALCIUM  10.1 9.4 8.5*  --    GFR Estimated Creatinine Clearance: 143 mL/min (by C-G formula based on SCr of 0.89 mg/dL). Liver Function Tests: Recent Labs  Lab 05/17/24 2308  AST 33  ALT 18  ALKPHOS 78  BILITOT 1.5*  PROT 8.5*  ALBUMIN 3.9   No results for input(s): LIPASE, AMYLASE in the last 168 hours. No results for input(s): AMMONIA in the last 168 hours. Coagulation profile Recent Labs  Lab 05/18/24 0007  INR 1.8*   COVID-19 Labs  No results for input(s): DDIMER, FERRITIN, LDH, CRP in the last 72 hours.  No results found for: SARSCOV2NAA  CBC: Recent Labs  Lab 05/17/24 2308 05/18/24 0330  WBC 11.4* 10.9*  NEUTROABS 9.5*  --   HGB 11.7* 10.9*  HCT 35.8* 33.4*  MCV 96.8 97.1  PLT 194 172   Cardiac Enzymes: No results for input(s): CKTOTAL, CKMB, CKMBINDEX, TROPONINI in the last 168 hours. BNP (last 3 results) Recent Labs    05/17/24 2308  PROBNP 72.0   CBG: No results for input(s): GLUCAP in the last 168 hours. D-Dimer: No results for input(s): DDIMER in the last 72 hours. Hgb A1c: No results for input(s): HGBA1C in the last 72 hours. Lipid Profile: No results for input(s): CHOL, HDL, LDLCALC, TRIG, CHOLHDL, LDLDIRECT in the last 72 hours. Thyroid function studies: No results for input(s): TSH, T4TOTAL, T3FREE, THYROIDAB in the last 72 hours.  Invalid input(s): FREET3 Anemia work up: No results for input(s): VITAMINB12, FOLATE, FERRITIN, TIBC, IRON, RETICCTPCT in the last 72 hours. Sepsis Labs: Recent Labs  Lab 05/17/24 2308 05/17/24 2325 05/18/24 0330 05/18/24 1053 05/18/24 1857  WBC 11.4*  --  10.9*  --   --   LATICACIDVEN  --  1.9  --  0.8 0.9   Microbiology Recent  Results (from the past 240 hours)  Blood Culture (routine x 2)     Status: Abnormal (Preliminary result)   Collection Time: 05/17/24 11:08 PM   Specimen: Left Antecubital; Blood  Result Value Ref Range Status   Specimen Description   Final    LEFT ANTECUBITAL Performed at [facility], [address] [place], [place] [address]    Special Requests   Final    BOTTLES DRAWN AEROBIC AND ANAEROBIC Blood Culture results may not be optimal due to an inadequate volume of blood received in culture bottles Performed at [facility], [address], [place], [place] [address]    Culture  Setup Time  Final    GRAM POSITIVE COCCI IN CLUSTERS ANAEROBIC BOTTLE ONLY CRITICAL RESULT CALLED TO, READ BACK BY AND VERIFIED WITH: PHARMD NICK GLOGOVAC ON 05/19/24 @ 144 BY DRT GRAM POSITIVE RODS AEROBIC BOTTLE ONLY CRITICAL RESULT CALLED TO, READ BACK BY AND VERIFIED WITH: PHARMD MICHELLE LILLISTON ON 05/19/24 @ 2154 BY DRT    Culture (A)  Final    FINEGOLDIA MAGNA CULTURE REINCUBATED FOR BETTER GROWTH Performed at [facility], [address], [place], [place] [address]    Report Status PENDING  Incomplete  Urine Culture     Status: Abnormal   Collection Time: 05/17/24 11:08 PM   Specimen: Urine, Random  Result Value Ref Range Status   Specimen Description   Final    URINE, RANDOM Performed at [facility], [address], [place], [place] [address]    Special Requests   Final    NONE Reflexed from [id number] Performed at [facility], [address], [place], [place] [address]    Culture >=100,000 COLONIES/mL ESCHERICHIA COLI (A)  Final   Report Status 05/20/2024 FINAL  Final   Organism ID, Bacteria ESCHERICHIA COLI (A)  Final      Susceptibility   Escherichia coli - MIC*    AMPICILLIN >=32 RESISTANT Resistant     CEFAZOLIN  (URINE) Value in next row Sensitive      16 SENSITIVEThis is a modified FDA-approved test  that has been validated and its performance characteristics determined by the reporting laboratory.  This laboratory is certified under the Clinical Laboratory Improvement Amendments CLIA as qualified to perform high complexity clinical laboratory testing.    CEFEPIME Value in next row Sensitive      16 SENSITIVEThis is a modified FDA-approved test that has been validated and its performance characteristics determined by the reporting laboratory.  This laboratory is certified under the Clinical Laboratory Improvement Amendments CLIA as qualified to perform high complexity clinical laboratory testing.    ERTAPENEM Value in next row Sensitive      16 SENSITIVEThis is a modified FDA-approved test that has been validated and its performance characteristics determined by the reporting laboratory.  This laboratory is certified under the Clinical Laboratory Improvement Amendments CLIA as qualified to perform high complexity clinical laboratory testing.    CEFTRIAXONE  Value in next row Sensitive      16 SENSITIVEThis is a modified FDA-approved test that has been validated and its performance characteristics determined by the reporting laboratory.  This laboratory is certified under the Clinical Laboratory Improvement Amendments CLIA as qualified to perform high complexity clinical laboratory testing.    CIPROFLOXACIN  Value in next row Sensitive      16 SENSITIVEThis is a modified FDA-approved test that has been validated and its performance characteristics determined by the reporting laboratory.  This laboratory is certified under the Clinical Laboratory Improvement Amendments CLIA as qualified to perform high complexity clinical laboratory testing.    GENTAMICIN Value in next row Sensitive      16 SENSITIVEThis is a modified FDA-approved test that has been validated and its performance characteristics determined by the reporting laboratory.  This laboratory is certified under the Clinical Laboratory Improvement  Amendments CLIA as qualified to perform high complexity clinical laboratory testing.    NITROFURANTOIN Value in next row Sensitive      16 SENSITIVEThis is a modified FDA-approved test that has been validated and its performance characteristics determined by the reporting laboratory.  This laboratory is certified under the Clinical Laboratory Improvement  Amendments CLIA as qualified to perform high complexity clinical laboratory testing.    TRIMETH /SULFA  Value in next row Sensitive      16 SENSITIVEThis is a modified FDA-approved test that has been validated and its performance characteristics determined by the reporting laboratory.  This laboratory is certified under the Clinical Laboratory Improvement Amendments CLIA as qualified to perform high complexity clinical laboratory testing.    AMPICILLIN/SULBACTAM Value in next row Resistant      16 SENSITIVEThis is a modified FDA-approved test that has been validated and its performance characteristics determined by the reporting laboratory.  This laboratory is certified under the Clinical Laboratory Improvement Amendments CLIA as qualified to perform high complexity clinical laboratory testing.    PIP/TAZO Value in next row Sensitive      <=4 SENSITIVEThis is a modified FDA-approved test that has been validated and its performance characteristics determined by the reporting laboratory.  This laboratory is certified under the Clinical Laboratory Improvement Amendments CLIA as qualified to perform high complexity clinical laboratory testing.    MEROPENEM Value in next row Sensitive      <=4 SENSITIVEThis is a modified FDA-approved test that has been validated and its performance characteristics determined by the reporting laboratory.  This laboratory is certified under the Clinical Laboratory Improvement Amendments CLIA as qualified to perform high complexity clinical laboratory testing.    * >=100,000 COLONIES/mL ESCHERICHIA COLI  Blood Culture ID Panel  (Reflexed)     Status: None   Collection Time: 05/17/24 11:08 PM  Result Value Ref Range Status   Enterococcus faecalis NOT DETECTED NOT DETECTED Final   Enterococcus Faecium NOT DETECTED NOT DETECTED Final   Listeria monocytogenes NOT DETECTED NOT DETECTED Final   Staphylococcus species NOT DETECTED NOT DETECTED Final   Staphylococcus aureus (BCID) NOT DETECTED NOT DETECTED Final   Staphylococcus epidermidis NOT DETECTED NOT DETECTED Final   Staphylococcus lugdunensis NOT DETECTED NOT DETECTED Final   Streptococcus species NOT DETECTED NOT DETECTED Final   Streptococcus agalactiae NOT DETECTED NOT DETECTED Final   Streptococcus pneumoniae NOT DETECTED NOT DETECTED Final   Streptococcus pyogenes NOT DETECTED NOT DETECTED Final   A.calcoaceticus-baumannii NOT DETECTED NOT DETECTED Final   Bacteroides fragilis NOT DETECTED NOT DETECTED Final   Enterobacterales NOT DETECTED NOT DETECTED Final   Enterobacter cloacae complex NOT DETECTED NOT DETECTED Final   Escherichia coli NOT DETECTED NOT DETECTED Final   Klebsiella aerogenes NOT DETECTED NOT DETECTED Final   Klebsiella oxytoca NOT DETECTED NOT DETECTED Final   Klebsiella pneumoniae NOT DETECTED NOT DETECTED Final   Proteus species NOT DETECTED NOT DETECTED Final   Salmonella species NOT DETECTED NOT DETECTED Final   Serratia marcescens NOT DETECTED NOT DETECTED Final   Haemophilus influenzae NOT DETECTED NOT DETECTED Final   Neisseria meningitidis NOT DETECTED NOT DETECTED Final   Pseudomonas aeruginosa NOT DETECTED NOT DETECTED Final   Stenotrophomonas maltophilia NOT DETECTED NOT DETECTED Final   Candida albicans NOT DETECTED NOT DETECTED Final   Candida auris NOT DETECTED NOT DETECTED Final   Candida glabrata NOT DETECTED NOT DETECTED Final   Candida krusei NOT DETECTED NOT DETECTED Final   Candida parapsilosis NOT DETECTED NOT DETECTED Final   Candida tropicalis NOT DETECTED NOT DETECTED Final   Cryptococcus neoformans/gattii  NOT DETECTED NOT DETECTED Final    Comment: Performed at [facility], [address] [place], [place] [address]  Blood Culture (routine x 2)     Status: None (Preliminary result)   Collection Time: 05/17/24 11:27  PM   Specimen: BLOOD LEFT FOREARM  Result Value Ref Range Status   Specimen Description   Final    BLOOD LEFT FOREARM Performed at [facility], [address] [place], [place] [address]    Special Requests   Final    BOTTLES DRAWN AEROBIC AND ANAEROBIC Blood Culture results may not be optimal due to an inadequate volume of blood received in culture bottles Performed at [facility], [address], [place], [place] [address]    Culture   Final    NO GROWTH 4 DAYS Performed at [facility], [address] [place], [place] [address]    Report Status PENDING  Incomplete     Medications:    allopurinol   200 mg Oral Daily   dorzolamide   1 drop Both Eyes BID   feeding supplement  237 mL Oral BID BM   latanoprost   1 drop Both Eyes QHS   linezolid   600 mg Oral Q12H   metoprolol  tartrate  25 mg Oral BID   nystatin    Topical BID   rivaroxaban   20 mg Oral Daily   rosuvastatin   10 mg Oral QPM   sulfamethoxazole -trimethoprim   1 tablet Oral Q12H   tamsulosin   0.8 mg Oral Daily   timolol   1 drop Both Eyes BID   Continuous Infusions:      LOS: 4 days   Erle Odell Castor  [facility]  05/22/2024, 8:34 AM

## 2024-05-23 DIAGNOSIS — R55 Syncope and collapse: Secondary | ICD-10-CM | POA: Diagnosis not present

## 2024-05-23 LAB — BASIC METABOLIC PANEL WITH GFR
Anion gap: 7 (ref 5–15)
BUN: 21 mg/dL (ref 8–23)
CO2: 23 mmol/L (ref 22–32)
Calcium: 8.4 mg/dL — ABNORMAL LOW (ref 8.9–10.3)
Chloride: 105 mmol/L (ref 98–111)
Creatinine, Ser: 1.23 mg/dL (ref 0.61–1.24)
GFR, Estimated: >60 mL/min (ref >60)
Glucose, Bld: 97 mg/dL (ref 70–99)
Potassium: 4.4 mmol/L (ref 3.5–5.1)
Sodium: 135 mmol/L (ref 135–145)

## 2024-05-23 NOTE — Progress Notes (Signed)
   05/23/24 0030  BiPAP/CPAP/SIPAP  $ Non-Invasive Home Ventilator  Subsequent  BiPAP/CPAP/SIPAP Pt Type Adult  BiPAP/CPAP/SIPAP Resmed  Mask Type Full face mask  Dentures removed? Not applicable  Mask Size Large  FiO2 (%) 21 %  Patient Home Machine No  Patient Home Mask No  Patient Home Tubing No  Auto Titrate Yes  Minimum cmH2O 15 cmH2O  Maximum cmH2O 25 cmH2O  Device Plugged into [employer] Yes

## 2024-05-23 NOTE — Progress Notes (Signed)
 Mobility Specialist - Progress Note   05/23/24 1100  Mobility  Activity Stood at bedside  Level of Assistance +2 (takes two people)  Nurse, Learning Disability of Motion/Exercises Active  Activity Response Tolerated fair  Mobility visit 1 Mobility  Mobility Specialist Start Time (ACUTE ONLY) 1115  Mobility Specialist Stop Time (ACUTE ONLY) 1125  Mobility Specialist Time Calculation (min) (ACUTE ONLY) 10 min   RN requested assistance to mobilize pt to [facility] Pt able to stand with assistance on both sides. Pt able to shuffle a couple of steps forward in order to place [facility] behind. Was left on [facility] with call bell in reach.   Erminio Leos,  Mobility Specialist Can be reached via Secure Chat

## 2024-05-23 NOTE — Plan of Care (Signed)

## 2024-05-23 NOTE — Progress Notes (Signed)
 [facility] PROGRESS NOTE    Progress Note  Colin Hall.  FMW:[id number] DOB: Jul 24, 1960 DOA: 05/17/2024 PCP: Thurmond Cathlyn LABOR., MD     Brief Narrative:   Colin Hall. is an 63 y.o. male past medical history of HFpEF, obstructive sleep apnea on CPAP, atrial fibrillation on Xarelto  CAD comes in with generalized weakness, patient resides in an independent living facility transfers and ambulates with a rollator can still drive found to have a possible UTI, with acute kidney injury in the ED on labs.  Assessment/Plan:   Sepsis secondary to UTI: Antihypertensive medications were held on admission. Blood cultures 1 out of 2 grew [facility] which is likely a contaminant.   Urine cultures grew E. coli sensitive to Bactrim . Now on oral Bactrim  has remained afebrile. PT evaluated the patient recommended skilled nursing facility.  Denied by inpatient rehab. Awaiting skilled insurance authorization.  Acute kidney injury: With a baseline creatinine of less than 1 admission 1.4 likely.  Likely pre-azotemia creatinine has returned to baseline.  Like  Tinea corporis Continue statin powder.  HFpEF/essential hypertension: Appears hypervolemic on physical exam. Continue to hold losartan Lasix  and Aldactone.   Blood pressure still borderline continue hold diltiazem , losartan and Aldactone. Resume Lasix  and continue metoprolol .  Obstructive sleep apnea: Continue CPAP at night.  Paroxysmal atrial fibrillation: Rate controlled holding diltiazem   as blood pressure was borderline. Continue Toprol  and Xarelto .  Glaucoma: Continue eyedrops.  BPH: Continue Flomax  will increase dose.  Morbid obesity: Noted.   DVT prophylaxis: Xarelto  Family Communication:none Status is: Inpatient Remains inpatient appropriate because: Sepsis secondary to UTI    Code Status:     Code Status Orders  (From admission, onward)           Start     Ordered   05/18/24 0118  Full  code  Continuous       Question:  By:  Answer:  Consent: discussion documented in EHR   05/18/24 0118           Code Status History     Date Active Date Inactive Code Status Order ID Comments User Context   09/19/2016 0333 09/20/2016 2138 Full Code [id number]  Franky Redia SAILOR, MD Inpatient   07/20/2014 1717 07/22/2014 1914 Full Code [id number]  Darron Deatrice LABOR, MD Inpatient   07/20/2014 0013 07/20/2014 1717 Full Code [id number]  Matthias Cough, MD Inpatient      Advance Directive Documentation    Flowsheet Row Most Recent Value  Type of Advance Directive Living will, Healthcare Power of Attorney  Pre-existing out of facility DNR order (yellow form or pink MOST form) --  MOST Form in Place? --      IV Access:   Peripheral IV   Procedures and diagnostic studies:   No results found.    Medical Consultants:   None.   Subjective:    Colin Hall. no complaints. Objective:    Vitals:   05/22/24 0500 05/22/24 1338 05/22/24 2018 05/23/24 0542  BP:  (!) 110/55 (!) 122/58 123/75  Pulse:  69 79 79  Resp:  18 16   Temp:  98 F (36.7 C) 98.5 F (36.9 C) 98.1 F (36.7 C)  TempSrc:  Oral Oral Oral  SpO2:  98% 99% 97%  Weight: (!) 195.5 kg     Height:       SpO2: 97 % FiO2 (%): 21 %   Intake/Output Summary (Last 24 hours) at 05/23/2024 0901 Last data filed at  05/23/2024 0551 Gross per 24 hour  Intake 240 ml  Output 1300 ml  Net -1060 ml   Filed Weights   05/20/24 0500 05/21/24 0500 05/22/24 0500  Weight: (!) 199 kg (!) 201 kg (!) 195.5 kg    Exam: General exam: In no acute distress. Respiratory system: Good air movement and clear to auscultation. Cardiovascular system: S1 & S2 heard, RRR. No JVD. Gastrointestinal system: Abdomen is nondistended, soft and nontender.  Extremities: No pedal edema. Skin: No rashes, lesions or ulcers Psychiatry: Judgement and insight appear normal. Mood & affect appropriate.  Data Reviewed:     Labs: Basic Metabolic Panel: Recent Labs  Lab 05/17/24 2308 05/18/24 0330 05/20/24 1207 05/21/24 0406 05/22/24 0945 05/23/24 0353  NA 132* 135 138  --  136 135  K 4.8 4.6 3.9  --  4.4 4.4  CL 100 103 109  --  106 105  CO2 20* 20* 21*  --  24 23  GLUCOSE 108* 102* 125*  --  98 97  BUN 56* 54* 30*  --  20 21  CREATININE 1.72* 1.49* 0.99 0.89 1.00 1.23  CALCIUM  10.1 9.4 8.5*  --  8.4* 8.4*   GFR Estimated Creatinine Clearance: 103.5 mL/min (by C-G formula based on SCr of 1.23 mg/dL). Liver Function Tests: Recent Labs  Lab 05/17/24 2308  AST 33  ALT 18  ALKPHOS 78  BILITOT 1.5*  PROT 8.5*  ALBUMIN 3.9   No results for input(s): LIPASE, AMYLASE in the last 168 hours. No results for input(s): AMMONIA in the last 168 hours. Coagulation profile Recent Labs  Lab 05/18/24 0007  INR 1.8*   COVID-19 Labs  No results for input(s): DDIMER, FERRITIN, LDH, CRP in the last 72 hours.  No results found for: SARSCOV2NAA  CBC: Recent Labs  Lab 05/17/24 2308 05/18/24 0330  WBC 11.4* 10.9*  NEUTROABS 9.5*  --   HGB 11.7* 10.9*  HCT 35.8* 33.4*  MCV 96.8 97.1  PLT 194 172   Cardiac Enzymes: No results for input(s): CKTOTAL, CKMB, CKMBINDEX, TROPONINI in the last 168 hours. BNP (last 3 results) Recent Labs    05/17/24 2308  PROBNP 72.0   CBG: No results for input(s): GLUCAP in the last 168 hours. D-Dimer: No results for input(s): DDIMER in the last 72 hours. Hgb A1c: No results for input(s): HGBA1C in the last 72 hours. Lipid Profile: No results for input(s): CHOL, HDL, LDLCALC, TRIG, CHOLHDL, LDLDIRECT in the last 72 hours. Thyroid function studies: No results for input(s): TSH, T4TOTAL, T3FREE, THYROIDAB in the last 72 hours.  Invalid input(s): FREET3 Anemia work up: No results for input(s): VITAMINB12, FOLATE, FERRITIN, TIBC, IRON, RETICCTPCT in the last 72 hours. Sepsis Labs: Recent Labs   Lab 05/17/24 2308 05/17/24 2325 05/18/24 0330 05/18/24 1053 05/18/24 1857  WBC 11.4*  --  10.9*  --   --   LATICACIDVEN  --  1.9  --  0.8 0.9   Microbiology Recent Results (from the past 240 hours)  Blood Culture (routine x 2)     Status: Abnormal (Preliminary result)   Collection Time: 05/17/24 11:08 PM   Specimen: Left Antecubital; Blood  Result Value Ref Range Status   Specimen Description   Final    LEFT ANTECUBITAL Performed at [facility], [address] [place], [place] [address]    Special Requests   Final    BOTTLES DRAWN AEROBIC AND ANAEROBIC Blood Culture results may not be optimal due to an inadequate volume  of blood received in culture bottles Performed at [facility], [address], [place], [place] [address]    Culture  Setup Time   Final    GRAM POSITIVE COCCI IN CLUSTERS ANAEROBIC BOTTLE ONLY CRITICAL RESULT CALLED TO, READ BACK BY AND VERIFIED WITH: PHARMD NICK GLOGOVAC ON 05/19/24 @ 144 BY DRT GRAM POSITIVE RODS AEROBIC BOTTLE ONLY CRITICAL RESULT CALLED TO, READ BACK BY AND VERIFIED WITH: PHARMD MICHELLE LILLISTON ON 05/19/24 @ 2154 BY DRT    Culture (A)  Final    FINEGOLDIA MAGNA CULTURE REINCUBATED FOR BETTER GROWTH Performed at [facility], [address], [place], [place] [address]    Report Status PENDING  Incomplete  Urine Culture     Status: Abnormal   Collection Time: 05/17/24 11:08 PM   Specimen: Urine, Random  Result Value Ref Range Status   Specimen Description   Final    URINE, RANDOM Performed at [facility], [address] [place], [place] [address]    Special Requests   Final    NONE Reflexed from [id number] Performed at [facility], [address], [place], [place] [address]    Culture >=100,000 COLONIES/mL ESCHERICHIA COLI (A)  Final   Report Status 05/20/2024 FINAL  Final   Organism ID, Bacteria ESCHERICHIA COLI (A)  Final       Susceptibility   Escherichia coli - MIC*    AMPICILLIN >=32 RESISTANT Resistant     CEFAZOLIN  (URINE) Value in next row Sensitive      16 SENSITIVEThis is a modified FDA-approved test that has been validated and its performance characteristics determined by the reporting laboratory.  This laboratory is certified under the Clinical Laboratory Improvement Amendments CLIA as qualified to perform high complexity clinical laboratory testing.    CEFEPIME Value in next row Sensitive      16 SENSITIVEThis is a modified FDA-approved test that has been validated and its performance characteristics determined by the reporting laboratory.  This laboratory is certified under the Clinical Laboratory Improvement Amendments CLIA as qualified to perform high complexity clinical laboratory testing.    ERTAPENEM Value in next row Sensitive      16 SENSITIVEThis is a modified FDA-approved test that has been validated and its performance characteristics determined by the reporting laboratory.  This laboratory is certified under the Clinical Laboratory Improvement Amendments CLIA as qualified to perform high complexity clinical laboratory testing.    CEFTRIAXONE  Value in next row Sensitive      16 SENSITIVEThis is a modified FDA-approved test that has been validated and its performance characteristics determined by the reporting laboratory.  This laboratory is certified under the Clinical Laboratory Improvement Amendments CLIA as qualified to perform high complexity clinical laboratory testing.    CIPROFLOXACIN  Value in next row Sensitive      16 SENSITIVEThis is a modified FDA-approved test that has been validated and its performance characteristics determined by the reporting laboratory.  This laboratory is certified under the Clinical Laboratory Improvement Amendments CLIA as qualified to perform high complexity clinical laboratory testing.    GENTAMICIN Value in next row Sensitive      16 SENSITIVEThis is a modified  FDA-approved test that has been validated and its performance characteristics determined by the reporting laboratory.  This laboratory is certified under the Clinical Laboratory Improvement Amendments CLIA as qualified to perform high complexity clinical laboratory testing.    NITROFURANTOIN Value in next row Sensitive      16 SENSITIVEThis  is a modified FDA-approved test that has been validated and its performance characteristics determined by the reporting laboratory.  This laboratory is certified under the Clinical Laboratory Improvement Amendments CLIA as qualified to perform high complexity clinical laboratory testing.    TRIMETH /SULFA  Value in next row Sensitive      16 SENSITIVEThis is a modified FDA-approved test that has been validated and its performance characteristics determined by the reporting laboratory.  This laboratory is certified under the Clinical Laboratory Improvement Amendments CLIA as qualified to perform high complexity clinical laboratory testing.    AMPICILLIN/SULBACTAM Value in next row Resistant      16 SENSITIVEThis is a modified FDA-approved test that has been validated and its performance characteristics determined by the reporting laboratory.  This laboratory is certified under the Clinical Laboratory Improvement Amendments CLIA as qualified to perform high complexity clinical laboratory testing.    PIP/TAZO Value in next row Sensitive      <=4 SENSITIVEThis is a modified FDA-approved test that has been validated and its performance characteristics determined by the reporting laboratory.  This laboratory is certified under the Clinical Laboratory Improvement Amendments CLIA as qualified to perform high complexity clinical laboratory testing.    MEROPENEM Value in next row Sensitive      <=4 SENSITIVEThis is a modified FDA-approved test that has been validated and its performance characteristics determined by the reporting laboratory.  This laboratory is certified under the  Clinical Laboratory Improvement Amendments CLIA as qualified to perform high complexity clinical laboratory testing.    * >=100,000 COLONIES/mL ESCHERICHIA COLI  Blood Culture ID Panel (Reflexed)     Status: None   Collection Time: 05/17/24 11:08 PM  Result Value Ref Range Status   Enterococcus faecalis NOT DETECTED NOT DETECTED Final   Enterococcus Faecium NOT DETECTED NOT DETECTED Final   Listeria monocytogenes NOT DETECTED NOT DETECTED Final   Staphylococcus species NOT DETECTED NOT DETECTED Final   Staphylococcus aureus (BCID) NOT DETECTED NOT DETECTED Final   Staphylococcus epidermidis NOT DETECTED NOT DETECTED Final   Staphylococcus lugdunensis NOT DETECTED NOT DETECTED Final   Streptococcus species NOT DETECTED NOT DETECTED Final   Streptococcus agalactiae NOT DETECTED NOT DETECTED Final   Streptococcus pneumoniae NOT DETECTED NOT DETECTED Final   Streptococcus pyogenes NOT DETECTED NOT DETECTED Final   A.calcoaceticus-baumannii NOT DETECTED NOT DETECTED Final   Bacteroides fragilis NOT DETECTED NOT DETECTED Final   Enterobacterales NOT DETECTED NOT DETECTED Final   Enterobacter cloacae complex NOT DETECTED NOT DETECTED Final   Escherichia coli NOT DETECTED NOT DETECTED Final   Klebsiella aerogenes NOT DETECTED NOT DETECTED Final   Klebsiella oxytoca NOT DETECTED NOT DETECTED Final   Klebsiella pneumoniae NOT DETECTED NOT DETECTED Final   Proteus species NOT DETECTED NOT DETECTED Final   Salmonella species NOT DETECTED NOT DETECTED Final   Serratia marcescens NOT DETECTED NOT DETECTED Final   Haemophilus influenzae NOT DETECTED NOT DETECTED Final   Neisseria meningitidis NOT DETECTED NOT DETECTED Final   Pseudomonas aeruginosa NOT DETECTED NOT DETECTED Final   Stenotrophomonas maltophilia NOT DETECTED NOT DETECTED Final   Candida albicans NOT DETECTED NOT DETECTED Final   Candida auris NOT DETECTED NOT DETECTED Final   Candida glabrata NOT DETECTED NOT DETECTED Final    Candida krusei NOT DETECTED NOT DETECTED Final   Candida parapsilosis NOT DETECTED NOT DETECTED Final   Candida tropicalis NOT DETECTED NOT DETECTED Final   Cryptococcus neoformans/gattii NOT DETECTED NOT DETECTED Final    Comment: Performed at [facility], [address] [place], [place] [address]  Blood Culture (routine x 2)     Status: None (Preliminary result)   Collection Time: 05/17/24 11:27 PM   Specimen: BLOOD LEFT FOREARM  Result Value Ref Range Status   Specimen Description   Final    BLOOD LEFT FOREARM Performed at [facility], [address] [place] [address] [place], [place] [address]    Special Requests   Final    BOTTLES DRAWN AEROBIC AND ANAEROBIC Blood Culture results may not be optimal due to an inadequate volume of blood received in culture bottles Performed at [facility], [address] [place], [place] [address]    Culture   Final    NO GROWTH 4 DAYS Performed at [facility], [address], [place], [place] [address]    Report Status PENDING  Incomplete     Medications:    allopurinol   200 mg Oral Daily   dorzolamide   1 drop Both Eyes BID   feeding supplement  237 mL Oral BID BM   latanoprost   1 drop Both Eyes QHS   metoprolol  tartrate  25 mg Oral BID   nystatin    Topical BID   rivaroxaban   20 mg Oral Daily   rosuvastatin   10 mg Oral QPM   sulfamethoxazole -trimethoprim   1 tablet Oral Q12H   tamsulosin   0.8 mg Oral Daily   timolol   1 drop Both Eyes BID   Continuous Infusions:      LOS: 5 days   Erle Odell Castor  [facility]  05/23/2024, 9:01 AM

## 2024-05-23 NOTE — Progress Notes (Signed)
   05/23/24 2342  BiPAP/CPAP/SIPAP  BiPAP/CPAP/SIPAP Pt Type Adult  BiPAP/CPAP/SIPAP Resmed  Mask Type Full face mask  Dentures removed? Not applicable  Mask Size Large  Respiratory Rate 18 breaths/min  FiO2 (%) 21 %  Patient Home Machine No  Patient Home Mask No  Patient Home Tubing No  Auto Titrate Yes  Minimum cmH2O 15 cmH2O  Maximum cmH2O 25 cmH2O  Nasal massage performed Yes  CPAP/SIPAP surface wiped down Yes  Device Plugged into [employer] Yes

## 2024-05-24 DIAGNOSIS — L03314 Cellulitis of groin: Secondary | ICD-10-CM | POA: Diagnosis not present

## 2024-05-24 DIAGNOSIS — R55 Syncope and collapse: Secondary | ICD-10-CM | POA: Diagnosis not present

## 2024-05-24 DIAGNOSIS — N179 Acute kidney failure, unspecified: Secondary | ICD-10-CM | POA: Diagnosis not present

## 2024-05-24 LAB — BASIC METABOLIC PANEL WITH GFR
Anion gap: 9 (ref 5–15)
BUN: 25 mg/dL — ABNORMAL HIGH (ref 8–23)
CO2: 22 mmol/L (ref 22–32)
Calcium: 8.6 mg/dL — ABNORMAL LOW (ref 8.9–10.3)
Chloride: 103 mmol/L (ref 98–111)
Creatinine, Ser: 1.16 mg/dL (ref 0.61–1.24)
GFR, Estimated: >60 mL/min (ref >60)
Glucose, Bld: 92 mg/dL (ref 70–99)
Potassium: 4.4 mmol/L (ref 3.5–5.1)
Sodium: 134 mmol/L — ABNORMAL LOW (ref 135–145)

## 2024-05-24 LAB — CULTURE, BLOOD (ROUTINE X 2)
Culture: NO GROWTH
Culture: NEGATIVE — AB

## 2024-05-24 NOTE — Discharge Summary (Signed)
 Physician Discharge Summary  Colin Hall. FMW:[id number] DOB: Jan 13, 1961 DOA: 05/17/2024  PCP: Thurmond Cathlyn LABOR., MD  Admit date: 05/17/2024 Discharge date: 05/24/2024  Admitted From: Home Disposition:  SNF  Recommendations for Outpatient Follow-up:  Follow up with PCP in 1-2 weeks Please obtain BMP/CBC in one week   Home Health:No Equipment/Devices:None  Discharge Condition:Stable CODE STATUS:Full Diet recommendation: Heart Healthy    Brief/Interim Summary: 63 y.o. male past medical history of HFpEF, obstructive sleep apnea on CPAP, atrial fibrillation on Xarelto  CAD comes in with generalized weakness, patient resides in an independent living facility transfers and ambulates with a rollator can still drive found to have a possible UTI, with acute kidney injury in the ED on labs.   Discharge Diagnoses:  Principal Problem:   Near syncope Active Problems:   Sepsis secondary to UTI (HCC)  Sepsis secondary to UTI: Antibiotic medications were held on admission. One of the 2 blood cultures grew contaminant. Urine culture grew E. coli sensitive Bactrim . He completed his treatment in house.  Next line PT evaluated the patient who go to skilled nursing facility.  Acute kidney injury With baseline creatinine around 1 diuretics were held his creatinine returned to baseline.  Tinea corporis: He was started on nystatin  powder in house.  HFpEF/essential hypertension: He had hypovolemic on physical exam. Losartan, diltiazem  Lasix  and Aldactone were held. Losartan Lasix  and Aldactone were resumed he will continue them as an outpatient. He will also continue metoprolol  diltiazem  was discontinued which will be evaluated as an outpatient if is needed.  Obstructive sleep apnea: Continue CPAP at night.  Paroxysmal atrial fibrillation: Remained in sinus rhythm diltiazem  was held. He will continue metoprolol  and Xarelto  as an outpatient. His heart rate remained around  70.  Glaucoma: Continue eyedrops.  BPH: Continue Flomax .  Morbid obesity: Noted. Discharge Instructions  Discharge Instructions     Diet - low sodium heart healthy   Complete by: As directed    Increase activity slowly   Complete by: As directed    No wound care   Complete by: As directed       Allergies as of 05/24/2024   No Known Allergies      Medication List     PAUSE taking these medications    diltiazem  120 MG 24 hr capsule Wait to take this until your doctor or other care provider tells you to start again. Commonly known as: CARDIZEM  CD Take 120 mg by mouth daily.       TAKE these medications    acetaminophen  650 MG CR tablet Commonly known as: TYLENOL  Take 1,300 mg by mouth every 8 (eight) hours as needed for pain.   allopurinol  100 MG tablet Commonly known as: ZYLOPRIM  Take 200 mg by mouth daily.   cyanocobalamin 1000 MCG tablet Commonly known as: VITAMIN B12 Take 1,000 mcg by mouth every other day.   dorzolamide  2 % ophthalmic solution Commonly known as: TRUSOPT  Place 1 drop into both eyes 2 (two) times daily.   furosemide  20 MG tablet Commonly known as: LASIX  Take 20 mg by mouth daily as needed for edema.   latanoprost  0.005 % ophthalmic solution Commonly known as: XALATAN  Place 1 drop into both eyes nightly.   losartan 50 MG tablet Commonly known as: COZAAR Take 50 mg by mouth daily.   metoprolol  tartrate 25 MG tablet Commonly known as: LOPRESSOR  Take 1 tablet (25 mg total) by mouth 2 (two) times daily.   Multi-Vitamin Daily Tabs Take 1 tablet by mouth  daily.   mupirocin ointment 2 % Commonly known as: BACTROBAN Apply 1 application  topically daily as needed (boils/ulcers).   rivaroxaban  20 MG Tabs tablet Commonly known as: XARELTO  Take 20 mg by mouth every evening.   rosuvastatin  10 MG tablet Commonly known as: CRESTOR  Take 10 mg by mouth every evening.   spironolactone 25 MG tablet Commonly known as:  ALDACTONE Take 1 tablet by mouth daily.   tamsulosin  0.4 MG Caps capsule Commonly known as: FLOMAX  Take 0.4 mg by mouth daily.   timolol  0.5 % ophthalmic solution Commonly known as: TIMOPTIC  Place 1 drop into both eyes 2 (two) times daily.   Trospium Chloride 60 MG Cp24 Take 1 capsule by mouth daily.   Urea  41 % Crea Apply to calloused skin once daily. What changed:  how much to take how to take this when to take this reasons to take this additional instructions        No Known Allergies  Consultations: None   Procedures/Studies: ECHOCARDIOGRAM COMPLETE Result Date: 05/18/2024    ECHOCARDIOGRAM REPORT   Patient Name:   Colin Hall. Date of Exam: 05/18/2024 Medical Rec #:  [id number]         Height:       69.0 in Accession #:    [id number]        Weight:       437.0 lb Date of Birth:  [birth year]          BSA:          2.880 m Patient Age:    63 years          BP:           100/44 mmHg Patient Gender: M                 HR:           98 bpm. Exam Location:  Inpatient Procedure: 2D Echo and Intracardiac Opacification Agent (Both Spectral and Color            Flow Doppler were utilized during procedure). Indications:    Syncope  History:        Patient has no prior history of Echocardiogram examinations.  Sonographer:    Charmaine Gaskins Referring Phys: [id number] PRINCE T DJAN  Sonographer Comments: Technically difficult study due to poor echo windows. Image acquisition challenging due to patient body habitus and 437 lbs. IMPRESSIONS  1. Vigorous LV function with turbulent flow through LV/LVOT No significant obstruction to outflow at rest or with Valsalva. Left ventricular ejection fraction, by estimation, is >75%. The left ventricle has hyperdynamic function. The left ventricle has no regional wall motion abnormalities. There is mild concentric left ventricular hypertrophy.  2. Right ventricular systolic function is normal. The right ventricular size is normal.  3. The mitral valve is  normal in structure. Trivial mitral valve regurgitation.  4. The aortic valve is tricuspid. Aortic valve regurgitation is not visualized. Aortic valve sclerosis/calcification is present, without any evidence of aortic stenosis.  5. The inferior vena cava is dilated in size with <50% respiratory variability, suggesting right atrial pressure of 15 mmHg. FINDINGS  Left Ventricle: Vigorous LV function with turbulent flow through LV/LVOT No significant obstruction to outflow at rest or with Valsalva. Left ventricular ejection fraction, by estimation, is >75%. The left ventricle has hyperdynamic function. The left ventricle has no regional wall motion abnormalities. Definity  contrast agent was given IV to delineate the left ventricular endocardial borders. The left  ventricular internal cavity size was normal in size. There is mild concentric left ventricular hypertrophy. Right Ventricle: The right ventricular size is normal. Right vetricular wall thickness was not assessed. Right ventricular systolic function is normal. Left Atrium: Left atrial size was normal in size. Right Atrium: Right atrial size was normal in size. Pericardium: Trivial pericardial effusion is present. Mitral Valve: The mitral valve is normal in structure. Trivial mitral valve regurgitation. Tricuspid Valve: The tricuspid valve is normal in structure. Tricuspid valve regurgitation is trivial. Aortic Valve: The aortic valve is tricuspid. Aortic valve regurgitation is not visualized. Aortic valve sclerosis/calcification is present, without any evidence of aortic stenosis. Pulmonic Valve: The pulmonic valve was not well visualized. Aorta: The aortic root and ascending aorta are structurally normal, with no evidence of dilitation. Venous: The inferior vena cava is dilated in size with less than 50% respiratory variability, suggesting right atrial pressure of 15 mmHg. IAS/Shunts: No atrial level shunt detected by color flow Doppler.  LEFT VENTRICLE PLAX 2D  LVIDd:         4.40 cm   Diastology LVIDs:         2.10 cm   LV e' medial:    11.80 cm/s LV PW:         1.20 cm   LV E/e' medial:  7.8 LV IVS:        1.20 cm   LV e' lateral:   11.90 cm/s LVOT diam:     2.50 cm   LV E/e' lateral: 7.8 LVOT Area:     4.91 cm  RIGHT VENTRICLE RV S prime:     21.30 cm/s LEFT ATRIUM           Index LA diam:      4.10 cm 1.42 cm/m LA Vol (A4C): 64.6 ml 22.43 ml/m   AORTA Ao Root diam: 3.20 cm Ao Asc diam:  3.40 cm MITRAL VALVE                TRICUSPID VALVE MV Area (PHT): 6.88 cm     TR Peak grad:   30.9 mmHg MV E velocity: 92.60 cm/s   TR Vmax:        278.00 cm/s MV A velocity: 134.00 cm/s MV E/A ratio:  0.69         SHUNTS                             Systemic Diam: 2.50 cm Vina Gull MD Electronically signed by Vina Gull MD Signature Date/Time: 05/18/2024/4:59:16 PM    Final    DG Chest Port 1 View Result Date: 05/17/2024 CLINICAL DATA:  Questionable sepsis EXAM: PORTABLE CHEST 1 VIEW COMPARISON:  Chest x-ray 07/19/2014 FINDINGS: The heart size and mediastinal contours are within normal limits. Both lungs are clear. The visualized skeletal structures are unremarkable. IMPRESSION: No active disease. Electronically Signed   By: Greig Pique M.D.   On: 05/17/2024 23:17   (Echo, Carotid, EGD, Colonoscopy, ERCP)    Subjective: No complaints  Discharge Exam: Vitals:   05/23/24 2107 05/24/24 0532  BP: (!) 144/84 121/62  Pulse: 77 77  Resp: 19 17  Temp: 99.2 F (37.3 C) 98.2 F (36.8 C)  SpO2: 98% 97%   Vitals:   05/23/24 1426 05/23/24 2107 05/24/24 0500 05/24/24 0532  BP: 122/77 (!) 144/84  121/62  Pulse: 77 77  77  Resp: 20 19  17   Temp: 97.9 F (36.6 C)  99.2 F (37.3 C)  98.2 F (36.8 C)  TempSrc: Oral     SpO2: 99% 98%  97%  Weight:   (!) 193.4 kg   Height:        General: Pt is alert, awake, not in acute distress Cardiovascular: RRR, S1/S2 +, no rubs, no gallops Respiratory: CTA bilaterally, no wheezing, no rhonchi Abdominal: Soft, NT, ND,  bowel sounds + Extremities: no edema, no cyanosis    The results of significant diagnostics from this hospitalization (including imaging, microbiology, ancillary and laboratory) are listed below for reference.     Microbiology: Recent Results (from the past 240 hours)  Blood Culture (routine x 2)     Status: Abnormal (Preliminary result)   Collection Time: 05/17/24 11:08 PM   Specimen: Left Antecubital; Blood  Result Value Ref Range Status   Specimen Description   Final    LEFT ANTECUBITAL Performed at [facility], [address] [place], [place] [address]    Special Requests   Final    BOTTLES DRAWN AEROBIC AND ANAEROBIC Blood Culture results may not be optimal due to an inadequate volume of blood received in culture bottles Performed at [facility], [address], [place], [place] [address]    Culture  Setup Time   Final    GRAM POSITIVE COCCI IN CLUSTERS ANAEROBIC BOTTLE ONLY CRITICAL RESULT CALLED TO, READ BACK BY AND VERIFIED WITH: PHARMD NICK GLOGOVAC ON 05/19/24 @ 144 BY DRT GRAM POSITIVE RODS AEROBIC BOTTLE ONLY CRITICAL RESULT CALLED TO, READ BACK BY AND VERIFIED WITH: PHARMD MICHELLE LILLISTON ON 05/19/24 @ 2154 BY DRT    Culture (A)  Final    FINEGOLDIA MAGNA CULTURE REINCUBATED FOR BETTER GROWTH Performed at [facility], [address], [place], [place] [address]    Report Status PENDING  Incomplete  Urine Culture     Status: Abnormal   Collection Time: 05/17/24 11:08 PM   Specimen: Urine, Random  Result Value Ref Range Status   Specimen Description   Final    URINE, RANDOM Performed at [facility], [address] [place], [place] [address]    Special Requests   Final    NONE Reflexed from [id number] Performed at [facility], [address], [place], [place] [address]    Culture >=100,000 COLONIES/mL ESCHERICHIA COLI (A)  Final   Report Status 05/20/2024 FINAL  Final    Organism ID, Bacteria ESCHERICHIA COLI (A)  Final      Susceptibility   Escherichia coli - MIC*    AMPICILLIN >=32 RESISTANT Resistant     CEFAZOLIN  (URINE) Value in next row Sensitive      16 SENSITIVEThis is a modified FDA-approved test that has been validated and its performance characteristics determined by the reporting laboratory.  This laboratory is certified under the Clinical Laboratory Improvement Amendments CLIA as qualified to perform high complexity clinical laboratory testing.    CEFEPIME Value in next row Sensitive      16 SENSITIVEThis is a modified FDA-approved test that has been validated and its performance characteristics determined by the reporting laboratory.  This laboratory is certified under the Clinical Laboratory Improvement Amendments CLIA as qualified to perform high complexity clinical laboratory testing.    ERTAPENEM Value in next row Sensitive      16 SENSITIVEThis is a modified FDA-approved test that has been validated and its performance characteristics determined by the reporting laboratory.  This laboratory is certified under the Clinical  Laboratory Improvement Amendments CLIA as qualified to perform high complexity clinical laboratory testing.    CEFTRIAXONE  Value in next row Sensitive      16 SENSITIVEThis is a modified FDA-approved test that has been validated and its performance characteristics determined by the reporting laboratory.  This laboratory is certified under the Clinical Laboratory Improvement Amendments CLIA as qualified to perform high complexity clinical laboratory testing.    CIPROFLOXACIN  Value in next row Sensitive      16 SENSITIVEThis is a modified FDA-approved test that has been validated and its performance characteristics determined by the reporting laboratory.  This laboratory is certified under the Clinical Laboratory Improvement Amendments CLIA as qualified to perform high complexity clinical laboratory testing.    GENTAMICIN Value in  next row Sensitive      16 SENSITIVEThis is a modified FDA-approved test that has been validated and its performance characteristics determined by the reporting laboratory.  This laboratory is certified under the Clinical Laboratory Improvement Amendments CLIA as qualified to perform high complexity clinical laboratory testing.    NITROFURANTOIN Value in next row Sensitive      16 SENSITIVEThis is a modified FDA-approved test that has been validated and its performance characteristics determined by the reporting laboratory.  This laboratory is certified under the Clinical Laboratory Improvement Amendments CLIA as qualified to perform high complexity clinical laboratory testing.    TRIMETH /SULFA  Value in next row Sensitive      16 SENSITIVEThis is a modified FDA-approved test that has been validated and its performance characteristics determined by the reporting laboratory.  This laboratory is certified under the Clinical Laboratory Improvement Amendments CLIA as qualified to perform high complexity clinical laboratory testing.    AMPICILLIN/SULBACTAM Value in next row Resistant      16 SENSITIVEThis is a modified FDA-approved test that has been validated and its performance characteristics determined by the reporting laboratory.  This laboratory is certified under the Clinical Laboratory Improvement Amendments CLIA as qualified to perform high complexity clinical laboratory testing.    PIP/TAZO Value in next row Sensitive      <=4 SENSITIVEThis is a modified FDA-approved test that has been validated and its performance characteristics determined by the reporting laboratory.  This laboratory is certified under the Clinical Laboratory Improvement Amendments CLIA as qualified to perform high complexity clinical laboratory testing.    MEROPENEM Value in next row Sensitive      <=4 SENSITIVEThis is a modified FDA-approved test that has been validated and its performance characteristics determined by the  reporting laboratory.  This laboratory is certified under the Clinical Laboratory Improvement Amendments CLIA as qualified to perform high complexity clinical laboratory testing.    * >=100,000 COLONIES/mL ESCHERICHIA COLI  Blood Culture ID Panel (Reflexed)     Status: None   Collection Time: 05/17/24 11:08 PM  Result Value Ref Range Status   Enterococcus faecalis NOT DETECTED NOT DETECTED Final   Enterococcus Faecium NOT DETECTED NOT DETECTED Final   Listeria monocytogenes NOT DETECTED NOT DETECTED Final   Staphylococcus species NOT DETECTED NOT DETECTED Final   Staphylococcus aureus (BCID) NOT DETECTED NOT DETECTED Final   Staphylococcus epidermidis NOT DETECTED NOT DETECTED Final   Staphylococcus lugdunensis NOT DETECTED NOT DETECTED Final   Streptococcus species NOT DETECTED NOT DETECTED Final   Streptococcus agalactiae NOT DETECTED NOT DETECTED Final   Streptococcus pneumoniae NOT DETECTED NOT DETECTED Final   Streptococcus pyogenes NOT DETECTED NOT DETECTED Final   A.calcoaceticus-baumannii NOT DETECTED NOT DETECTED Final   Bacteroides fragilis  NOT DETECTED NOT DETECTED Final   Enterobacterales NOT DETECTED NOT DETECTED Final   Enterobacter cloacae complex NOT DETECTED NOT DETECTED Final   Escherichia coli NOT DETECTED NOT DETECTED Final   Klebsiella aerogenes NOT DETECTED NOT DETECTED Final   Klebsiella oxytoca NOT DETECTED NOT DETECTED Final   Klebsiella pneumoniae NOT DETECTED NOT DETECTED Final   Proteus species NOT DETECTED NOT DETECTED Final   Salmonella species NOT DETECTED NOT DETECTED Final   Serratia marcescens NOT DETECTED NOT DETECTED Final   Haemophilus influenzae NOT DETECTED NOT DETECTED Final   Neisseria meningitidis NOT DETECTED NOT DETECTED Final   Pseudomonas aeruginosa NOT DETECTED NOT DETECTED Final   Stenotrophomonas maltophilia NOT DETECTED NOT DETECTED Final   Candida albicans NOT DETECTED NOT DETECTED Final   Candida auris NOT DETECTED NOT DETECTED Final    Candida glabrata NOT DETECTED NOT DETECTED Final   Candida krusei NOT DETECTED NOT DETECTED Final   Candida parapsilosis NOT DETECTED NOT DETECTED Final   Candida tropicalis NOT DETECTED NOT DETECTED Final   Cryptococcus neoformans/gattii NOT DETECTED NOT DETECTED Final    Comment: Performed at [facility], [address], [place], [place] [address]  Blood Culture (routine x 2)     Status: None   Collection Time: 05/17/24 11:27 PM   Specimen: BLOOD LEFT FOREARM  Result Value Ref Range Status   Specimen Description   Final    BLOOD LEFT FOREARM Performed at [facility], [address] [place], [place] [address]    Special Requests   Final    BOTTLES DRAWN AEROBIC AND ANAEROBIC Blood Culture results may not be optimal due to an inadequate volume of blood received in culture bottles Performed at [facility], [address] [place], [place] [address]    Culture   Final    NO GROWTH 6 DAYS Performed at [facility], [address], [place], [place] [address]    Report Status 05/24/2024 FINAL  Final     Labs: BNP (last 3 results) No results for input(s): BNP in the last 8760 hours. Basic Metabolic Panel: Recent Labs  Lab 05/18/24 0330 05/20/24 1207 05/21/24 0406 05/22/24 0945 05/23/24 0353 05/24/24 0348  NA 135 138  --  136 135 134*  K 4.6 3.9  --  4.4 4.4 4.4  CL 103 109  --  106 105 103  CO2 20* 21*  --  24 23 22   GLUCOSE 102* 125*  --  98 97 92  BUN 54* 30*  --  20 21 25*  CREATININE 1.49* 0.99 0.89 1.00 1.23 1.16  CALCIUM  9.4 8.5*  --  8.4* 8.4* 8.6*   Liver Function Tests: Recent Labs  Lab 05/17/24 2308  AST 33  ALT 18  ALKPHOS 78  BILITOT 1.5*  PROT 8.5*  ALBUMIN 3.9   No results for input(s): LIPASE, AMYLASE in the last 168 hours. No results for input(s): AMMONIA in the last 168 hours. CBC: Recent Labs  Lab 05/17/24 2308 05/18/24 0330  WBC 11.4* 10.9*  NEUTROABS 9.5*  --   HGB 11.7*  10.9*  HCT 35.8* 33.4*  MCV 96.8 97.1  PLT 194 172   Cardiac Enzymes: No results for input(s): CKTOTAL, CKMB, CKMBINDEX, TROPONINI in the last 168 hours. BNP: Invalid input(s): POCBNP CBG: No results for input(s): GLUCAP in the last 168 hours. D-Dimer No results for input(s): DDIMER in the last 72 hours. Hgb A1c No results for input(s): HGBA1C in the last  72 hours. Lipid Profile No results for input(s): CHOL, HDL, LDLCALC, TRIG, CHOLHDL, LDLDIRECT in the last 72 hours. Thyroid function studies No results for input(s): TSH, T4TOTAL, T3FREE, THYROIDAB in the last 72 hours.  Invalid input(s): FREET3 Anemia work up No results for input(s): VITAMINB12, FOLATE, FERRITIN, TIBC, IRON, RETICCTPCT in the last 72 hours. Urinalysis    Component Value Date/Time   COLORURINE YELLOW 05/17/2024 2308   APPEARANCEUR HAZY (A) 05/17/2024 2308   LABSPEC 1.017 05/17/2024 2308   PHURINE 5.0 05/17/2024 2308   GLUCOSEU NEGATIVE 05/17/2024 2308   HGBUR SMALL (A) 05/17/2024 2308   BILIRUBINUR NEGATIVE 05/17/2024 2308   KETONESUR NEGATIVE 05/17/2024 2308   PROTEINUR NEGATIVE 05/17/2024 2308   UROBILINOGEN 2.0 (H) 07/20/2014 0040   NITRITE NEGATIVE 05/17/2024 2308   LEUKOCYTESUR MODERATE (A) 05/17/2024 2308   Sepsis Labs Recent Labs  Lab 05/17/24 2308 05/18/24 0330  WBC 11.4* 10.9*   Microbiology Recent Results (from the past 240 hours)  Blood Culture (routine x 2)     Status: Abnormal (Preliminary result)   Collection Time: 05/17/24 11:08 PM   Specimen: Left Antecubital; Blood  Result Value Ref Range Status   Specimen Description   Final    LEFT ANTECUBITAL Performed at [facility], [address] [place], [place] [address]    Special Requests   Final    BOTTLES DRAWN AEROBIC AND ANAEROBIC Blood Culture results may not be optimal due to an inadequate volume of blood received in culture bottles Performed at [facility], [address], [place], [place] [address]    Culture  Setup Time   Final    GRAM POSITIVE COCCI IN CLUSTERS ANAEROBIC BOTTLE ONLY CRITICAL RESULT CALLED TO, READ BACK BY AND VERIFIED WITH: PHARMD NICK GLOGOVAC ON 05/19/24 @ 144 BY DRT GRAM POSITIVE RODS AEROBIC BOTTLE ONLY CRITICAL RESULT CALLED TO, READ BACK BY AND VERIFIED WITH: PHARMD MICHELLE LILLISTON ON 05/19/24 @ 2154 BY DRT    Culture (A)  Final    FINEGOLDIA MAGNA CULTURE REINCUBATED FOR BETTER GROWTH Performed at [facility], [address] [place], [place] [address]    Report Status PENDING  Incomplete  Urine Culture     Status: Abnormal   Collection Time: 05/17/24 11:08 PM   Specimen: Urine, Random  Result Value Ref Range Status   Specimen Description   Final    URINE, RANDOM Performed at [facility], [address] [place], [place] [address]    Special Requests   Final    NONE Reflexed from [id number] Performed at [facility], [address] [place], [place] [address]    Culture >=100,000 COLONIES/mL ESCHERICHIA COLI (A)  Final   Report Status 05/20/2024 FINAL  Final   Organism ID, Bacteria ESCHERICHIA COLI (A)  Final      Susceptibility   Escherichia coli - MIC*    AMPICILLIN >=32 RESISTANT Resistant     CEFAZOLIN  (URINE) Value in next row Sensitive      16 SENSITIVEThis is a modified FDA-approved test that has been validated and its performance characteristics determined by the reporting laboratory.  This laboratory is certified under the Clinical Laboratory Improvement Amendments CLIA as qualified to perform high complexity clinical laboratory testing.    CEFEPIME Value in next row Sensitive      16 SENSITIVEThis is a modified FDA-approved test that has been validated and its performance characteristics determined by the reporting laboratory.  This laboratory is certified under  the Clinical Laboratory Improvement Amendments CLIA as qualified  to perform high complexity clinical laboratory testing.    ERTAPENEM Value in next row Sensitive      16 SENSITIVEThis is a modified FDA-approved test that has been validated and its performance characteristics determined by the reporting laboratory.  This laboratory is certified under the Clinical Laboratory Improvement Amendments CLIA as qualified to perform high complexity clinical laboratory testing.    CEFTRIAXONE  Value in next row Sensitive      16 SENSITIVEThis is a modified FDA-approved test that has been validated and its performance characteristics determined by the reporting laboratory.  This laboratory is certified under the Clinical Laboratory Improvement Amendments CLIA as qualified to perform high complexity clinical laboratory testing.    CIPROFLOXACIN  Value in next row Sensitive      16 SENSITIVEThis is a modified FDA-approved test that has been validated and its performance characteristics determined by the reporting laboratory.  This laboratory is certified under the Clinical Laboratory Improvement Amendments CLIA as qualified to perform high complexity clinical laboratory testing.    GENTAMICIN Value in next row Sensitive      16 SENSITIVEThis is a modified FDA-approved test that has been validated and its performance characteristics determined by the reporting laboratory.  This laboratory is certified under the Clinical Laboratory Improvement Amendments CLIA as qualified to perform high complexity clinical laboratory testing.    NITROFURANTOIN Value in next row Sensitive      16 SENSITIVEThis is a modified FDA-approved test that has been validated and its performance characteristics determined by the reporting laboratory.  This laboratory is certified under the Clinical Laboratory Improvement Amendments CLIA as qualified to perform high complexity clinical laboratory testing.    TRIMETH /SULFA  Value in next row Sensitive      16 SENSITIVEThis is a modified FDA-approved test that has  been validated and its performance characteristics determined by the reporting laboratory.  This laboratory is certified under the Clinical Laboratory Improvement Amendments CLIA as qualified to perform high complexity clinical laboratory testing.    AMPICILLIN/SULBACTAM Value in next row Resistant      16 SENSITIVEThis is a modified FDA-approved test that has been validated and its performance characteristics determined by the reporting laboratory.  This laboratory is certified under the Clinical Laboratory Improvement Amendments CLIA as qualified to perform high complexity clinical laboratory testing.    PIP/TAZO Value in next row Sensitive      <=4 SENSITIVEThis is a modified FDA-approved test that has been validated and its performance characteristics determined by the reporting laboratory.  This laboratory is certified under the Clinical Laboratory Improvement Amendments CLIA as qualified to perform high complexity clinical laboratory testing.    MEROPENEM Value in next row Sensitive      <=4 SENSITIVEThis is a modified FDA-approved test that has been validated and its performance characteristics determined by the reporting laboratory.  This laboratory is certified under the Clinical Laboratory Improvement Amendments CLIA as qualified to perform high complexity clinical laboratory testing.    * >=100,000 COLONIES/mL ESCHERICHIA COLI  Blood Culture ID Panel (Reflexed)     Status: None   Collection Time: 05/17/24 11:08 PM  Result Value Ref Range Status   Enterococcus faecalis NOT DETECTED NOT DETECTED Final   Enterococcus Faecium NOT DETECTED NOT DETECTED Final   Listeria monocytogenes NOT DETECTED NOT DETECTED Final   Staphylococcus species NOT DETECTED NOT DETECTED Final   Staphylococcus aureus (BCID) NOT DETECTED NOT DETECTED Final   Staphylococcus epidermidis NOT DETECTED NOT DETECTED Final  Staphylococcus lugdunensis NOT DETECTED NOT DETECTED Final   Streptococcus species NOT DETECTED NOT  DETECTED Final   Streptococcus agalactiae NOT DETECTED NOT DETECTED Final   Streptococcus pneumoniae NOT DETECTED NOT DETECTED Final   Streptococcus pyogenes NOT DETECTED NOT DETECTED Final   A.calcoaceticus-baumannii NOT DETECTED NOT DETECTED Final   Bacteroides fragilis NOT DETECTED NOT DETECTED Final   Enterobacterales NOT DETECTED NOT DETECTED Final   Enterobacter cloacae complex NOT DETECTED NOT DETECTED Final   Escherichia coli NOT DETECTED NOT DETECTED Final   Klebsiella aerogenes NOT DETECTED NOT DETECTED Final   Klebsiella oxytoca NOT DETECTED NOT DETECTED Final   Klebsiella pneumoniae NOT DETECTED NOT DETECTED Final   Proteus species NOT DETECTED NOT DETECTED Final   Salmonella species NOT DETECTED NOT DETECTED Final   Serratia marcescens NOT DETECTED NOT DETECTED Final   Haemophilus influenzae NOT DETECTED NOT DETECTED Final   Neisseria meningitidis NOT DETECTED NOT DETECTED Final   Pseudomonas aeruginosa NOT DETECTED NOT DETECTED Final   Stenotrophomonas maltophilia NOT DETECTED NOT DETECTED Final   Candida albicans NOT DETECTED NOT DETECTED Final   Candida auris NOT DETECTED NOT DETECTED Final   Candida glabrata NOT DETECTED NOT DETECTED Final   Candida krusei NOT DETECTED NOT DETECTED Final   Candida parapsilosis NOT DETECTED NOT DETECTED Final   Candida tropicalis NOT DETECTED NOT DETECTED Final   Cryptococcus neoformans/gattii NOT DETECTED NOT DETECTED Final    Comment: Performed at [facility], [address] [place], [place] [address]  Blood Culture (routine x 2)     Status: None   Collection Time: 05/17/24 11:27 PM   Specimen: BLOOD LEFT FOREARM  Result Value Ref Range Status   Specimen Description   Final    BLOOD LEFT FOREARM Performed at [facility], [address], [place], [place] [address]    Special Requests   Final    BOTTLES DRAWN AEROBIC AND ANAEROBIC Blood Culture results may not be optimal due to an inadequate volume of  blood received in culture bottles Performed at [facility], [address] [place], [place] [address]    Culture   Final    NO GROWTH 6 DAYS Performed at [facility], [address] [place], [place] [address]    Report Status 05/24/2024 FINAL  Final     Time coordinating discharge: Over 30 minutes  SIGNED:   Erle Odell Castor, MD  [facility] 05/24/2024, 6:58 AM Pager   If 7PM-7AM, please contact night-coverage [email or web address] Password [email or web address]

## 2024-05-24 NOTE — Plan of Care (Signed)

## 2024-05-24 NOTE — TOC Progression Note (Addendum)
 Transition of Care (TOC) - Progression Note    Patient Details  Name: Colin Hall. MRN: [id number] Date of Birth: 1961-01-11  Transition of Care [facility]) CM/SW Contact  Tawni CHRISTELLA Eva, LCSW Phone Number: 05/24/2024, 9:26 AM  Clinical Narrative:     Pt's insurance auth for [facility] is pending. Care management to follow.    ADDEN  3:39pm  CSW received message from [place] with [facility], she stated pt insurance shara was approved , however they will not have a bed until the 30th of November. ICM to follow.   Expected Discharge Plan: Skilled Nursing Facility Barriers to Discharge: Continued Medical Work up               Expected Discharge Plan and Services       Living arrangements for the past 2 months: Single Family Home Expected Discharge Date: 05/24/24                                     Social Drivers of Health (SDOH) Interventions SDOH Screenings   Food Insecurity: No Food Insecurity (05/18/2024)  Housing: Low Risk  (05/18/2024)  Transportation Needs: No Transportation Needs (05/18/2024)  Utilities: Not At Risk (05/18/2024)  Tobacco Use: Low Risk  (05/17/2024)    Readmission Risk Interventions     No data to display

## 2024-05-24 NOTE — Progress Notes (Signed)
 Pt placed on CPAP for the night w/no o2 bled in.   05/24/24 2348  BiPAP/CPAP/SIPAP  $ Non-Invasive Home Ventilator  Subsequent  $ Face Mask Large  Yes  BiPAP/CPAP/SIPAP Pt Type Adult  BiPAP/CPAP/SIPAP Resmed  Mask Type Full face mask  Dentures removed? Not applicable  Mask Size Large  EPAP 15 cmH2O  FiO2 (%) 21 %  Patient Home Machine No  Patient Home Mask No  Patient Home Tubing No  BiPAP/CPAP /SiPAP Vitals  Pulse Rate 86  Resp 20  SpO2 99 %  MEWS Score/Color  MEWS Score 0  MEWS Score Color Green

## 2024-05-25 DIAGNOSIS — R55 Syncope and collapse: Secondary | ICD-10-CM | POA: Diagnosis not present

## 2024-05-25 LAB — BASIC METABOLIC PANEL WITH GFR
Anion gap: 7 (ref 5–15)
BUN: 26 mg/dL — ABNORMAL HIGH (ref 8–23)
CO2: 23 mmol/L (ref 22–32)
Calcium: 8.6 mg/dL — ABNORMAL LOW (ref 8.9–10.3)
Chloride: 103 mmol/L (ref 98–111)
Creatinine, Ser: 1.33 mg/dL — ABNORMAL HIGH (ref 0.61–1.24)
GFR, Estimated: 60 mL/min — ABNORMAL LOW (ref >60)
Glucose, Bld: 92 mg/dL (ref 70–99)
Potassium: 4.6 mmol/L (ref 3.5–5.1)
Sodium: 133 mmol/L — ABNORMAL LOW (ref 135–145)

## 2024-05-25 MED ORDER — SULFAMETHOXAZOLE-TRIMETHOPRIM 800-160 MG PO TABS
1.0000 | ORAL_TABLET | Freq: Two times a day (BID) | ORAL | Status: AC
  Administered 2024-05-25: 1 via ORAL
  Filled 2024-05-25: qty 1

## 2024-05-25 NOTE — Progress Notes (Signed)
 Physical Therapy Treatment Patient Details Name: Colin Hall. MRN: [id number] DOB: [birth year] Today's Date: 05/25/2024   History of Present Illness Pt is a 63 y/o M admitted on 05/17/24 after presenting with c/o generalized weakness. Pt is being treated for sepsis due to UTI. PMH: morbid obesity, dCHF, OSA on CPAP, AF on xarelto , HTN, CAD    PT Comments  Cognition Comments: AxO x 3 pleasant and motivated.  Currently retired living home with GrandSon (55 yo).  Pt was IND/Driving prior to admit.  Pt progressing slowly and plans to D/C to [facility] when a [facility] bed opens.  Per chart review, November 30th. Pt was sitting EOB on arrival having just complete a session with OT.  General transfer comment: Assisted with sit to stand required + 2 Max Assist using Bariatric walker.  Pt had to use momentum and forward weightshift while pulling up using walker.  Required + 2 side by side assist and heavy support to secure walker. General Gait Details: assisted with amb a limited distance of 12 feet with a bari walker + 2 side by side assist with recliner closely following behind.  Pt required increased time and heavy lean on walker for support.  Distance limited by increased c/o fatigue.  Positioned din recliner to comfort.   LPT has [facility].   If plan is discharge home, recommend the following: Two people to help with walking and/or transfers;Two people to help with bathing/dressing/bathroom;Assistance with cooking/housework;Help with stairs or ramp for entrance   Can travel by private vehicle     No  Equipment Recommendations  Hospital bed;Hoyer lift;Wheelchair cushion (measurements PT);Wheelchair (measurements PT)    Recommendations for Other Services Rehab consult     Precautions / Restrictions Precautions Precautions: Fall Recall of Precautions/Restrictions: Intact Restrictions Weight Bearing Restrictions Per Provider Order: No     Mobility  Bed Mobility               General  bed mobility comments: Pt sitting EOB on arrival (just finished session with OT)    Transfers Overall transfer level: Needs assistance Equipment used: Rolling walker (2 wheels) Transfers: Sit to/from Stand Sit to Stand: Min assist, +2 physical assistance, From elevated surface, Mod assist           General transfer comment: Assisted with sit to stand required + 2 Max Assist using Bariatric walker.  Pt had to use momentum and forward weightshift while pulling up using walker.  Required + 2 side by side assist and heavy support to secure walker.    Ambulation/Gait Ambulation/Gait assistance: Mod assist, Max assist Gait Distance (Feet): 12 Feet Assistive device: Rolling walker (2 wheels) Gait Pattern/deviations: Step-to pattern, Wide base of support Gait velocity: decreased     General Gait Details: assisted with amb a limited distance with abri walker + 2 side by side assist with recliner closely following behind.   Stairs             Wheelchair Mobility     Tilt Bed    Modified Rankin (Stroke Patients Only)       Balance                                            Communication Communication Communication: No apparent difficulties  Cognition Arousal: Alert Behavior During Therapy: WFL for tasks assessed/performed   PT - Cognitive impairments:  No apparent impairments                       PT - Cognition Comments: AxO x 3 pleasant and motivated.  Currently retired living home with GrandSon (68 yo).  Pt was IND/Driving prior to admit. Following commands: Intact      Cueing Cueing Techniques: Verbal cues  Exercises      General Comments        Pertinent Vitals/Pain Pain Assessment Pain Assessment: No/denies pain    Home Living                          Prior Function            PT Goals (current goals can now be found in the care plan section) Progress towards PT goals: Progressing toward goals     Frequency    Min 2X/week      PT Plan      Co-evaluation              AM-PAC PT 6 Clicks Mobility   Outcome Measure  Help needed turning from your back to your side while in a flat bed without using bedrails?: A Lot Help needed moving from lying on your back to sitting on the side of a flat bed without using bedrails?: A Lot Help needed moving to and from a bed to a chair (including a wheelchair)?: A Lot Help needed standing up from a chair using your arms (e.g., wheelchair or bedside chair)?: A Lot Help needed to walk in hospital room?: A Lot Help needed climbing 3-5 steps with a railing? : Total 6 Click Score: 11    End of Session Equipment Utilized During Treatment: Gait belt Activity Tolerance: Patient limited by fatigue Patient left: in chair;with call bell/phone within reach Nurse Communication: Mobility status PT Visit Diagnosis: Muscle weakness (generalized) (M62.81);Difficulty in walking, not elsewhere classified (R26.2)     Time: 8949-8885 PT Time Calculation (min) (ACUTE ONLY): 24 min  Charges:    $Gait Training: 8-22 mins $Therapeutic Activity: 8-22 mins PT General Charges $$ ACUTE PT VISIT: 1 Visit                     Katheryn Leap  PTA Acute  Rehabilitation Services Office M-F          [phone]

## 2024-05-25 NOTE — Progress Notes (Signed)
 Occupational Therapy Treatment Patient Details Name: Colin Hall. MRN: [id number] DOB: [birth year] Today's Date: 05/25/2024   History of present illness Pt is a 63 yr old male admitted on 05/17/24 after presenting with generalized weakness. Pt is being treated for sepsis due to UTI. PMH: obesity, CHF, OSA on CPAP, AF a fib, HTN, CAD   OT comments  The patient was seen for functional strengthening, progression of ADL participation, and instruction on therapeutic exercises for strengthening needed to facilitate progressive ADL performance. He required mod assist to perform supine to sit. Once seated EOB, he presented with fair+ sitting balance, while he performed upper body grooming. OT further instructed him on resistance exercises using an exercise band. He performed 10 reps and 1 set of lateral exchanges, bicep curls and tricep extensions with stand-by assist. He was left seated EOB with rehab tech and PTA in the room. He presented with good effort and participation. He has excellent rehab potential. Continue OT plan of care to maximize his independence with self care tasks and to decrease the risk for restricted participation in other meaningful activities. Patient will benefit from intensive inpatient follow-up therapy, >3 hours/day.       If plan is discharge home, recommend the following:  A lot of help with walking and/or transfers;A lot of help with bathing/dressing/bathroom;Assistance with cooking/housework;Help with stairs or ramp for entrance   Equipment Recommendations  Other (comment) (defer to next level of care)    Recommendations for Other Services      Precautions / Restrictions Precautions Precautions: Fall Restrictions Weight Bearing Restrictions Per Provider Order: No       Mobility Bed Mobility Overal bed mobility: Needs Assistance Bed Mobility: Supine to Sit     Supine to sit: Mod assist, Used rails, HOB elevated     General bed mobility comments: Pt  required assist for trunk and cues to reach across body to grab & pull on bed rail       Balance     Sitting balance-Leahy Scale: Fair         ADL either performed or assessed with clinical judgement   ADL Overall ADL's : Needs assistance/impaired     Grooming: Set up;Sitting;Oral care;Wash/dry hands Grooming Details (indicate cue type and reason): The pt performed teeth brushing seated EOB.             Lower Body Dressing: Total assistance;Bed level Lower Body Dressing Details (indicate cue type and reason): Assist needed to donn socks.                     Vision Baseline Vision/History: 1 Wears glasses           Communication Communication Communication: No apparent difficulties   Cognition Arousal: Alert Behavior During Therapy: WFL for tasks assessed/performed Cognition: No apparent impairments             OT - Cognition Comments: Oriented x4                 Following commands: Intact        Cueing   Cueing Techniques: Verbal cues  Exercises              Pertinent Vitals/ Pain       Pain Assessment Pain Assessment: No/denies pain   Frequency  Min 2X/week        Progress Toward Goals  OT Goals(current goals can now be found in the care plan section)  Progress towards OT  goals: Progressing toward goals  Acute Rehab OT Goals Patient Stated Goal: to return to prior level of functioning OT Goal Formulation: With patient Time For Goal Achievement: 06/02/24 Potential to Achieve Goals: Good  Plan         AM-PAC OT 6 Clicks Daily Activity     Outcome Measure   Help from another person eating meals?: None Help from another person taking care of personal grooming?: A Little Help from another person toileting, which includes using toliet, bedpan, or urinal?: A Lot Help from another person bathing (including washing, rinsing, drying)?: A Lot Help from another person to put on and taking off regular upper body  clothing?: A Little Help from another person to put on and taking off regular lower body clothing?: Total 6 Click Score: 15    End of Session Equipment Utilized During Treatment: Other (comment) (N/A)  OT Visit Diagnosis: Unsteadiness on feet (R26.81);Other abnormalities of gait and mobility (R26.89);Muscle weakness (generalized) (M62.81)   Activity Tolerance Patient tolerated treatment well   Patient Left in bed;Other (comment) (with rehab tech and PTA present)   Nurse Communication Mobility status        Time: 8967-8951 OT Time Calculation (min): 16 min  Charges: OT General Charges $OT Visit: 1 Visit OT Treatments $Self Care/Home Management : 8-22 mins     Delanna JINNY Lesches, OTR/L 05/25/2024, 11:12 AM

## 2024-05-25 NOTE — Progress Notes (Signed)
 [facility] PROGRESS NOTE    Progress Note  Colin Hall.  FMW:[id number] DOB: 01/02/1961 DOA: 05/17/2024 PCP: Thurmond Cathlyn LABOR., MD     Brief Narrative:   Colin Hall. is an 63 y.o. male past medical history of HFpEF, obstructive sleep apnea on CPAP, atrial fibrillation on Xarelto  CAD comes in with generalized weakness, patient resides in an independent living facility transfers and ambulates with a rollator can still drive found to have a possible UTI, with acute kidney injury in the ED on labs.  Assessment/Plan:   Sepsis secondary to UTI: Antihypertensive medications were held on admission. Blood cultures 1 out of 2 grew [facility] which is likely a contaminant.   Urine cultures grew E. coli sensitive to Bactrim . Has Completed treatment in house. PT evaluated the patient recommended skilled nursing facility.   Denied by inpatient rehab. Awaiting skilled insurance authorization.  Acute kidney injury: With a baseline creatinine of less than 1 admission 1.4 likely.  Likely pre-azotemia creatinine has returned to baseline.  Like  Tinea corporis Continue statin powder.  HFpEF/essential hypertension: Appears hypervolemic on physical exam. Continue to hold losartan Lasix  and Aldactone.   Blood pressure still borderline continue hold diltiazem , losartan and Aldactone. Resume Lasix  and continue metoprolol .  Obstructive sleep apnea: Continue CPAP at night.  Paroxysmal atrial fibrillation: Rate controlled holding diltiazem   as blood pressure was borderline. Continue Toprol  and Xarelto .  Glaucoma: Continue eyedrops.  BPH: Continue Flomax  will increase dose.  Morbid obesity: Noted.   DVT prophylaxis: Xarelto  Family Communication:none Status is: Inpatient Remains inpatient appropriate because: Sepsis secondary to UTI    Code Status:     Code Status Orders  (From admission, onward)           Start     Ordered   05/18/24 0118  Full code   Continuous       Question:  By:  Answer:  Consent: discussion documented in EHR   05/18/24 0118           Code Status History     Date Active Date Inactive Code Status Order ID Comments User Context   09/19/2016 0333 09/20/2016 2138 Full Code [id number]  Franky Redia SAILOR, MD Inpatient   07/20/2014 1717 07/22/2014 1914 Full Code [id number]  Darron Deatrice LABOR, MD Inpatient   07/20/2014 0013 07/20/2014 1717 Full Code [id number]  Matthias Cough, MD Inpatient      Advance Directive Documentation    Flowsheet Row Most Recent Value  Type of Advance Directive Living will, Healthcare Power of Attorney  Pre-existing out of facility DNR order (yellow form or pink MOST form) --  MOST Form in Place? --      IV Access:   Peripheral IV   Procedures and diagnostic studies:   No results found.    Medical Consultants:   None.   Subjective:    Colin Hall. no complaints. Objective:    Vitals:   05/24/24 1332 05/24/24 2036 05/24/24 2348 05/25/24 0629  BP: 121/75 (!) 143/73  125/69  Pulse: 69 84 86 80  Resp: 18 19 20 18   Temp: 98.6 F (37 C) 98.2 F (36.8 C)  99.6 F (37.6 C)  TempSrc: Oral Oral  Oral  SpO2: 95% 100% 99% 97%  Weight:      Height:       SpO2: 97 % FiO2 (%): 21 %   Intake/Output Summary (Last 24 hours) at 05/25/2024 9072 Last data filed at 05/24/2024 1332 Gross per  24 hour  Intake 360 ml  Output 500 ml  Net -140 ml   Filed Weights   05/21/24 0500 05/22/24 0500 05/24/24 0500  Weight: (!) 201 kg (!) 195.5 kg (!) 193.4 kg    Exam: General exam: In no acute distress. Respiratory system: Good air movement and clear to auscultation. Cardiovascular system: S1 & S2 heard, RRR. No JVD. Gastrointestinal system: Abdomen is nondistended, soft and nontender.  Extremities: No pedal edema. Skin: No rashes, lesions or ulcers Psychiatry: Judgement and insight appear normal. Mood & affect appropriate.  Data Reviewed:    Labs: Basic Metabolic  Panel: Recent Labs  Lab 05/20/24 1207 05/21/24 0406 05/22/24 0945 05/23/24 0353 05/24/24 0348 05/25/24 0407  NA 138  --  136 135 134* 133*  K 3.9  --  4.4 4.4 4.4 4.6  CL 109  --  106 105 103 103  CO2 21*  --  24 23 22 23   GLUCOSE 125*  --  98 97 92 92  BUN 30*  --  20 21 25* 26*  CREATININE 0.99 0.89 1.00 1.23 1.16 1.33*  CALCIUM  8.5*  --  8.4* 8.4* 8.6* 8.6*   GFR Estimated Creatinine Clearance: 95.1 mL/min (A) (by C-G formula based on SCr of 1.33 mg/dL (H)). Liver Function Tests: No results for input(s): AST, ALT, ALKPHOS, BILITOT, PROT, ALBUMIN in the last 168 hours.  No results for input(s): LIPASE, AMYLASE in the last 168 hours. No results for input(s): AMMONIA in the last 168 hours. Coagulation profile No results for input(s): INR, PROTIME in the last 168 hours.  COVID-19 Labs  No results for input(s): DDIMER, FERRITIN, LDH, CRP in the last 72 hours.  No results found for: SARSCOV2NAA  CBC: No results for input(s): WBC, NEUTROABS, HGB, HCT, MCV, PLT in the last 168 hours.  Cardiac Enzymes: No results for input(s): CKTOTAL, CKMB, CKMBINDEX, TROPONINI in the last 168 hours. BNP (last 3 results) Recent Labs    05/17/24 2308  PROBNP 72.0   CBG: No results for input(s): GLUCAP in the last 168 hours. D-Dimer: No results for input(s): DDIMER in the last 72 hours. Hgb A1c: No results for input(s): HGBA1C in the last 72 hours. Lipid Profile: No results for input(s): CHOL, HDL, LDLCALC, TRIG, CHOLHDL, LDLDIRECT in the last 72 hours. Thyroid function studies: No results for input(s): TSH, T4TOTAL, T3FREE, THYROIDAB in the last 72 hours.  Invalid input(s): FREET3 Anemia work up: No results for input(s): VITAMINB12, FOLATE, FERRITIN, TIBC, IRON, RETICCTPCT in the last 72 hours. Sepsis Labs: Recent Labs  Lab 05/18/24 1053 05/18/24 1857  LATICACIDVEN 0.8 0.9    Microbiology Recent Results (from the past 240 hours)  Blood Culture (routine x 2)     Status: Abnormal   Collection Time: 05/17/24 11:08 PM   Specimen: Left Antecubital; Blood  Result Value Ref Range Status   Specimen Description   Final    LEFT ANTECUBITAL Performed at [facility], [address] [place], [place] [address]    Special Requests   Final    BOTTLES DRAWN AEROBIC AND ANAEROBIC Blood Culture results may not be optimal due to an inadequate volume of blood received in culture bottles Performed at [facility], [address], [place], [place] [address]    Culture  Setup Time   Final    GRAM POSITIVE COCCI IN CLUSTERS ANAEROBIC BOTTLE ONLY CRITICAL RESULT CALLED TO, READ BACK BY AND VERIFIED WITH: PHARMD NICK GLOGOVAC ON 05/19/24 @ 144 BY DRT  GRAM POSITIVE RODS AEROBIC BOTTLE ONLY CRITICAL RESULT CALLED TO, READ BACK BY AND VERIFIED WITH: PHARMD MICHELLE LILLISTON ON 05/19/24 @ 2154 BY DRT    Culture (A)  Final    FINEGOLDIA MAGNA CORYNEBACTERIUM MINUTISSIMUM Standardized susceptibility testing for this organism is not available. Performed at [facility], [address] [place], [place] [address]    Report Status 05/24/2024 FINAL  Final  Urine Culture     Status: Abnormal   Collection Time: 05/17/24 11:08 PM   Specimen: Urine, Random  Result Value Ref Range Status   Specimen Description   Final    URINE, RANDOM Performed at [facility], [address] [place], [place] [address]    Special Requests   Final    NONE Reflexed from [id number] Performed at [facility], [address] [place], [place] [address]    Culture >=100,000 COLONIES/mL ESCHERICHIA COLI (A)  Final   Report Status 05/20/2024 FINAL  Final   Organism ID, Bacteria ESCHERICHIA COLI (A)  Final      Susceptibility   Escherichia coli - MIC*    AMPICILLIN >=32 RESISTANT Resistant     CEFAZOLIN  (URINE) Value in  next row Sensitive      16 SENSITIVEThis is a modified FDA-approved test that has been validated and its performance characteristics determined by the reporting laboratory.  This laboratory is certified under the Clinical Laboratory Improvement Amendments CLIA as qualified to perform high complexity clinical laboratory testing.    CEFEPIME Value in next row Sensitive      16 SENSITIVEThis is a modified FDA-approved test that has been validated and its performance characteristics determined by the reporting laboratory.  This laboratory is certified under the Clinical Laboratory Improvement Amendments CLIA as qualified to perform high complexity clinical laboratory testing.    ERTAPENEM Value in next row Sensitive      16 SENSITIVEThis is a modified FDA-approved test that has been validated and its performance characteristics determined by the reporting laboratory.  This laboratory is certified under the Clinical Laboratory Improvement Amendments CLIA as qualified to perform high complexity clinical laboratory testing.    CEFTRIAXONE  Value in next row Sensitive      16 SENSITIVEThis is a modified FDA-approved test that has been validated and its performance characteristics determined by the reporting laboratory.  This laboratory is certified under the Clinical Laboratory Improvement Amendments CLIA as qualified to perform high complexity clinical laboratory testing.    CIPROFLOXACIN  Value in next row Sensitive      16 SENSITIVEThis is a modified FDA-approved test that has been validated and its performance characteristics determined by the reporting laboratory.  This laboratory is certified under the Clinical Laboratory Improvement Amendments CLIA as qualified to perform high complexity clinical laboratory testing.    GENTAMICIN Value in next row Sensitive      16 SENSITIVEThis is a modified FDA-approved test that has been validated and its performance characteristics determined by the reporting laboratory.   This laboratory is certified under the Clinical Laboratory Improvement Amendments CLIA as qualified to perform high complexity clinical laboratory testing.    NITROFURANTOIN Value in next row Sensitive      16 SENSITIVEThis is a modified FDA-approved test that has been validated and its performance characteristics determined by the reporting laboratory.  This laboratory is certified under the Clinical Laboratory Improvement Amendments CLIA as qualified to perform high complexity clinical laboratory testing.    TRIMETH /SULFA  Value in next row Sensitive  16 SENSITIVEThis is a modified FDA-approved test that has been validated and its performance characteristics determined by the reporting laboratory.  This laboratory is certified under the Clinical Laboratory Improvement Amendments CLIA as qualified to perform high complexity clinical laboratory testing.    AMPICILLIN/SULBACTAM Value in next row Resistant      16 SENSITIVEThis is a modified FDA-approved test that has been validated and its performance characteristics determined by the reporting laboratory.  This laboratory is certified under the Clinical Laboratory Improvement Amendments CLIA as qualified to perform high complexity clinical laboratory testing.    PIP/TAZO Value in next row Sensitive      <=4 SENSITIVEThis is a modified FDA-approved test that has been validated and its performance characteristics determined by the reporting laboratory.  This laboratory is certified under the Clinical Laboratory Improvement Amendments CLIA as qualified to perform high complexity clinical laboratory testing.    MEROPENEM Value in next row Sensitive      <=4 SENSITIVEThis is a modified FDA-approved test that has been validated and its performance characteristics determined by the reporting laboratory.  This laboratory is certified under the Clinical Laboratory Improvement Amendments CLIA as qualified to perform high complexity clinical laboratory testing.     * >=100,000 COLONIES/mL ESCHERICHIA COLI  Blood Culture ID Panel (Reflexed)     Status: None   Collection Time: 05/17/24 11:08 PM  Result Value Ref Range Status   Enterococcus faecalis NOT DETECTED NOT DETECTED Final   Enterococcus Faecium NOT DETECTED NOT DETECTED Final   Listeria monocytogenes NOT DETECTED NOT DETECTED Final   Staphylococcus species NOT DETECTED NOT DETECTED Final   Staphylococcus aureus (BCID) NOT DETECTED NOT DETECTED Final   Staphylococcus epidermidis NOT DETECTED NOT DETECTED Final   Staphylococcus lugdunensis NOT DETECTED NOT DETECTED Final   Streptococcus species NOT DETECTED NOT DETECTED Final   Streptococcus agalactiae NOT DETECTED NOT DETECTED Final   Streptococcus pneumoniae NOT DETECTED NOT DETECTED Final   Streptococcus pyogenes NOT DETECTED NOT DETECTED Final   A.calcoaceticus-baumannii NOT DETECTED NOT DETECTED Final   Bacteroides fragilis NOT DETECTED NOT DETECTED Final   Enterobacterales NOT DETECTED NOT DETECTED Final   Enterobacter cloacae complex NOT DETECTED NOT DETECTED Final   Escherichia coli NOT DETECTED NOT DETECTED Final   Klebsiella aerogenes NOT DETECTED NOT DETECTED Final   Klebsiella oxytoca NOT DETECTED NOT DETECTED Final   Klebsiella pneumoniae NOT DETECTED NOT DETECTED Final   Proteus species NOT DETECTED NOT DETECTED Final   Salmonella species NOT DETECTED NOT DETECTED Final   Serratia marcescens NOT DETECTED NOT DETECTED Final   Haemophilus influenzae NOT DETECTED NOT DETECTED Final   Neisseria meningitidis NOT DETECTED NOT DETECTED Final   Pseudomonas aeruginosa NOT DETECTED NOT DETECTED Final   Stenotrophomonas maltophilia NOT DETECTED NOT DETECTED Final   Candida albicans NOT DETECTED NOT DETECTED Final   Candida auris NOT DETECTED NOT DETECTED Final   Candida glabrata NOT DETECTED NOT DETECTED Final   Candida krusei NOT DETECTED NOT DETECTED Final   Candida parapsilosis NOT DETECTED NOT DETECTED Final   Candida  tropicalis NOT DETECTED NOT DETECTED Final   Cryptococcus neoformans/gattii NOT DETECTED NOT DETECTED Final    Comment: Performed at [facility], [address] [place], [place] [address]  Blood Culture (routine x 2)     Status: None   Collection Time: 05/17/24 11:27 PM   Specimen: BLOOD LEFT FOREARM  Result Value Ref Range Status   Specimen Description   Final    BLOOD LEFT FOREARM Performed  at [facility], [address] [place], [place] [address]    Special Requests   Final    BOTTLES DRAWN AEROBIC AND ANAEROBIC Blood Culture results may not be optimal due to an inadequate volume of blood received in culture bottles Performed at [facility], [address] [place], [place] [address]    Culture   Final    NO GROWTH 6 DAYS Performed at [facility], [address] [place], [place] [address]    Report Status 05/24/2024 FINAL  Final     Medications:    allopurinol   200 mg Oral Daily   dorzolamide   1 drop Both Eyes BID   feeding supplement  237 mL Oral BID BM   latanoprost   1 drop Both Eyes QHS   metoprolol  tartrate  25 mg Oral BID   nystatin    Topical BID   rivaroxaban   20 mg Oral Daily   rosuvastatin   10 mg Oral QPM   sulfamethoxazole -trimethoprim   1 tablet Oral Q12H   tamsulosin   0.8 mg Oral Daily   timolol   1 drop Both Eyes BID   Continuous Infusions:      LOS: 7 days   Erle Odell Castor  [facility]  05/25/2024, 9:27 AM

## 2024-05-25 NOTE — Plan of Care (Signed)

## 2024-05-25 NOTE — Progress Notes (Signed)
   05/25/24 2305  BiPAP/CPAP/SIPAP  BiPAP/CPAP/SIPAP Pt Type Adult  BiPAP/CPAP/SIPAP Resmed  Mask Type Full face mask  Dentures removed? Not applicable  Mask Size Large  EPAP 15 cmH2O  Patient Home Machine No  Patient Home Mask No  Patient Home Tubing No  Device Plugged into RED Power Outlet Yes

## 2024-05-26 DIAGNOSIS — A419 Sepsis, unspecified organism: Secondary | ICD-10-CM | POA: Diagnosis not present

## 2024-05-26 DIAGNOSIS — N390 Urinary tract infection, site not specified: Secondary | ICD-10-CM | POA: Diagnosis not present

## 2024-05-26 DIAGNOSIS — N400 Enlarged prostate without lower urinary tract symptoms: Secondary | ICD-10-CM | POA: Insufficient documentation

## 2024-05-26 DIAGNOSIS — N179 Acute kidney failure, unspecified: Secondary | ICD-10-CM | POA: Insufficient documentation

## 2024-05-26 LAB — BASIC METABOLIC PANEL WITH GFR
Anion gap: 9 (ref 5–15)
BUN: 25 mg/dL — ABNORMAL HIGH (ref 8–23)
CO2: 23 mmol/L (ref 22–32)
Calcium: 8.7 mg/dL — ABNORMAL LOW (ref 8.9–10.3)
Chloride: 102 mmol/L (ref 98–111)
Creatinine, Ser: 1.15 mg/dL (ref 0.61–1.24)
GFR, Estimated: >60 mL/min (ref >60)
Glucose, Bld: 108 mg/dL — ABNORMAL HIGH (ref 70–99)
Potassium: 4.1 mmol/L (ref 3.5–5.1)
Sodium: 134 mmol/L — ABNORMAL LOW (ref 135–145)

## 2024-05-26 NOTE — Progress Notes (Signed)
   05/26/24 2246  BiPAP/CPAP/SIPAP  BiPAP/CPAP/SIPAP Pt Type Adult  BiPAP/CPAP/SIPAP Resmed  Mask Type Full face mask  Dentures removed? Not applicable  Mask Size Large  Respiratory Rate 18 breaths/min  EPAP 15 cmH2O  FiO2 (%) 21 %  Patient Home Machine No  Patient Home Mask No  Patient Home Tubing No  Auto Titrate Yes  Minimum cmH2O 15 cmH2O  Maximum cmH2O 25 cmH2O  CPAP/SIPAP surface wiped down Yes  Device Plugged into RED Power Outlet Yes  BiPAP/CPAP /SiPAP Vitals  Resp 18  Bilateral Breath Sounds Clear;Diminished  MEWS Score/Color  MEWS Score 0  MEWS Score Color Green

## 2024-05-26 NOTE — Progress Notes (Signed)
 PROGRESS NOTE    Colin FORBES Pamelia Mickey.  FMW:[id number] DOB: [birth year] DOA: 05/17/2024 PCP: Thurmond Cathlyn LABOR., MD     Brief Narrative:  Colin FORBES Pamelia Mickey. is an 63 y.o. male past medical history of HFpEF, obstructive sleep apnea on CPAP, atrial fibrillation on Xarelto  CAD comes in with generalized weakness, patient resides in an independent living facility transfers and ambulates with a rollator can still drive found to have a possible UTI, with acute kidney injury in the ED on labs.  Patient was admitted for sepsis secondary to UTI, urine culture positive for E. coli.  Patient completed antibiotic therapy in the hospital.  Also with AKI, resolved with treatment.  New events last 24 hours / Subjective: Feeling well without any complaints today.  Awaiting SNF placement, bed available 11/30 due to lack of bariatric beds available  Assessment & Plan:   Principal Problem:   Sepsis secondary to UTI [facility]) Active Problems:   OSA on CPAP   Chronic diastolic heart failure (HCC)   Glaucoma   PAF (paroxysmal atrial fibrillation) (HCC)   Morbid obesity with BMI of 60.0-69.9, adult (HCC)   HTN (hypertension)   Near syncope   BPH (benign prostatic hyperplasia)   AKI (acute kidney injury)   DVT prophylaxis:  rivaroxaban  (XARELTO ) tablet 20 mg  Code Status: Full code Family Communication: None at bedside Disposition Plan: SNF Status is: Inpatient Remains inpatient appropriate because: Disposition    Antimicrobials:  Anti-infectives (From admission, onward)    Start     Dose/Rate Route Frequency Ordered Stop   05/25/24 2200  sulfamethoxazole -trimethoprim  (BACTRIM  DS) 800-160 MG per tablet 1 tablet        1 tablet Oral Every 12 hours 05/25/24 0930 05/25/24 2122   05/21/24 1000  linezolid  (ZYVOX ) tablet 600 mg  Status:  Discontinued        600 mg Oral Every 12 hours 05/21/24 0818 05/22/24 1348   05/21/24 1000  sulfamethoxazole -trimethoprim  (BACTRIM  DS) 800-160 MG per tablet 1 tablet  Status:   Discontinued        1 tablet Oral Every 12 hours 05/21/24 0822 05/25/24 0930   05/20/24 2200  linezolid  (ZYVOX ) IVPB 600 mg  Status:  Discontinued        600 mg 300 mL/hr over 60 Minutes Intravenous Every 12 hours 05/20/24 0833 05/21/24 0818   05/20/24 0800  vancomycin  (VANCOREADY) IVPB 1250 mg/250 mL  Status:  Discontinued        1,250 mg 166.7 mL/hr over 90 Minutes Intravenous Every 12 hours 05/19/24 1758 05/20/24 0833   05/19/24 1845  vancomycin  (VANCOREADY) IVPB 2000 mg/400 mL        2,000 mg 200 mL/hr over 120 Minutes Intravenous  Once 05/19/24 1758 05/19/24 2104   05/19/24 0000  cefTRIAXone  (ROCEPHIN ) 2 g in sodium chloride  0.9 % 100 mL IVPB  Status:  Discontinued        2 g 200 mL/hr over 30 Minutes Intravenous Every 24 hours 05/18/24 1210 05/21/24 0822   05/18/24 0600  ceFAZolin  (ANCEF ) IVPB 2g/100 mL premix  Status:  Discontinued        2 g 200 mL/hr over 30 Minutes Intravenous Every 8 hours 05/18/24 0236 05/18/24 1210   05/18/24 0000  cefTRIAXone  (ROCEPHIN ) 1 g in sodium chloride  0.9 % 100 mL IVPB        1 g 200 mL/hr over 30 Minutes Intravenous  Once 05/17/24 2346 05/18/24 0041        Objective: Vitals:   05/26/24  0500 05/26/24 0632 05/26/24 0902 05/26/24 1335  BP:  117/64 (!) 115/56 126/73  Pulse:  69 77 73  Resp:  18  18  Temp:  97.8 F (36.6 C)  97.8 F (36.6 C)  TempSrc:  Oral  Oral  SpO2:  100%  95%  Weight: (!) 195.3 kg     Height:        Intake/Output Summary (Last 24 hours) at 05/26/2024 1413 Last data filed at 05/26/2024 1340 Gross per 24 hour  Intake 720 ml  Output 1350 ml  Net -630 ml   Filed Weights   05/22/24 0500 05/24/24 0500 05/26/24 0500  Weight: (!) 195.5 kg (!) 193.4 kg (!) 195.3 kg    Examination:  General exam: Appears calm and comfortable    Data Reviewed: I have personally reviewed following labs and imaging studies  CBC: No results for input(s): WBC, NEUTROABS, HGB, HCT, MCV, PLT in the last 168 hours. Basic  Metabolic Panel: Recent Labs  Lab 05/22/24 0945 05/23/24 0353 05/24/24 0348 05/25/24 0407 05/26/24 0826  NA 136 135 134* 133* 134*  K 4.4 4.4 4.4 4.6 4.1  CL 106 105 103 103 102  CO2 24 23 22 23 23   GLUCOSE 98 97 92 92 108*  BUN 20 21 25* 26* 25*  CREATININE 1.00 1.23 1.16 1.33* 1.15  CALCIUM  8.4* 8.4* 8.6* 8.6* 8.7*   GFR: Estimated Creatinine Clearance: 110.6 mL/min (by C-G formula based on SCr of 1.15 mg/dL). Liver Function Tests: No results for input(s): AST, ALT, ALKPHOS, BILITOT, PROT, ALBUMIN in the last 168 hours. No results for input(s): LIPASE, AMYLASE in the last 168 hours. No results for input(s): AMMONIA in the last 168 hours. Coagulation Profile: No results for input(s): INR, PROTIME in the last 168 hours. Cardiac Enzymes: No results for input(s): CKTOTAL, CKMB, CKMBINDEX, TROPONINI in the last 168 hours. BNP (last 3 results) Recent Labs    05/17/24 2308  PROBNP 72.0   HbA1C: No results for input(s): HGBA1C in the last 72 hours. CBG: No results for input(s): GLUCAP in the last 168 hours. Lipid Profile: No results for input(s): CHOL, HDL, LDLCALC, TRIG, CHOLHDL, LDLDIRECT in the last 72 hours. Thyroid Function Tests: No results for input(s): TSH, T4TOTAL, FREET4, T3FREE, THYROIDAB in the last 72 hours. Anemia Panel: No results for input(s): VITAMINB12, FOLATE, FERRITIN, TIBC, IRON, RETICCTPCT in the last 72 hours. Sepsis Labs: No results for input(s): PROCALCITON, LATICACIDVEN in the last 168 hours.  Recent Results (from the past 240 hours)  Blood Culture (routine x 2)     Status: Abnormal   Collection Time: 05/17/24 11:08 PM   Specimen: Left Antecubital; Blood  Result Value Ref Range Status   Specimen Description   Final    LEFT ANTECUBITAL Performed at [facility] [place], [address], [place], [place] [address]    Special Requests   Final    BOTTLES DRAWN  AEROBIC AND ANAEROBIC Blood Culture results may not be optimal due to an inadequate volume of blood received in culture bottles Performed at [facility], [address], [place], [place] [address]    Culture  Setup Time   Final    GRAM POSITIVE COCCI IN CLUSTERS ANAEROBIC BOTTLE ONLY CRITICAL RESULT CALLED TO, READ BACK BY AND VERIFIED WITH: PHARMD NICK GLOGOVAC ON 05/19/24 @ 144 BY DRT GRAM POSITIVE RODS AEROBIC BOTTLE ONLY CRITICAL RESULT CALLED TO, READ BACK BY AND VERIFIED WITH: PHARMD MICHELLE LILLISTON ON 05/19/24 @ 2154 BY DRT  Culture (A)  Final    FINEGOLDIA MAGNA CORYNEBACTERIUM MINUTISSIMUM Standardized susceptibility testing for this organism is not available. Performed at [facility], [address] [place], [place] [address]    Report Status 05/24/2024 FINAL  Final  Urine Culture     Status: Abnormal   Collection Time: 05/17/24 11:08 PM   Specimen: Urine, Random  Result Value Ref Range Status   Specimen Description   Final    URINE, RANDOM Performed at [facility], [address] [place], [place] [address]    Special Requests   Final    NONE Reflexed from [id number] Performed at [facility], [address], [place], [place] [address]    Culture >=100,000 COLONIES/mL ESCHERICHIA COLI (A)  Final   Report Status 05/20/2024 FINAL  Final   Organism ID, Bacteria ESCHERICHIA COLI (A)  Final      Susceptibility   Escherichia coli - MIC*    AMPICILLIN >=32 RESISTANT Resistant     CEFAZOLIN  (URINE) Value in next row Sensitive      16 SENSITIVEThis is a modified FDA-approved test that has been validated and its performance characteristics determined by the reporting laboratory.  This laboratory is certified under the Clinical Laboratory Improvement Amendments CLIA as qualified to perform high complexity clinical laboratory testing.    CEFEPIME Value in next row Sensitive      16 SENSITIVEThis is a modified  FDA-approved test that has been validated and its performance characteristics determined by the reporting laboratory.  This laboratory is certified under the Clinical Laboratory Improvement Amendments CLIA as qualified to perform high complexity clinical laboratory testing.    ERTAPENEM Value in next row Sensitive      16 SENSITIVEThis is a modified FDA-approved test that has been validated and its performance characteristics determined by the reporting laboratory.  This laboratory is certified under the Clinical Laboratory Improvement Amendments CLIA as qualified to perform high complexity clinical laboratory testing.    CEFTRIAXONE  Value in next row Sensitive      16 SENSITIVEThis is a modified FDA-approved test that has been validated and its performance characteristics determined by the reporting laboratory.  This laboratory is certified under the Clinical Laboratory Improvement Amendments CLIA as qualified to perform high complexity clinical laboratory testing.    CIPROFLOXACIN  Value in next row Sensitive      16 SENSITIVEThis is a modified FDA-approved test that has been validated and its performance characteristics determined by the reporting laboratory.  This laboratory is certified under the Clinical Laboratory Improvement Amendments CLIA as qualified to perform high complexity clinical laboratory testing.    GENTAMICIN Value in next row Sensitive      16 SENSITIVEThis is a modified FDA-approved test that has been validated and its performance characteristics determined by the reporting laboratory.  This laboratory is certified under the Clinical Laboratory Improvement Amendments CLIA as qualified to perform high complexity clinical laboratory testing.    NITROFURANTOIN Value in next row Sensitive      16 SENSITIVEThis is a modified FDA-approved test that has been validated and its performance characteristics determined by the reporting laboratory.  This laboratory is certified under the Clinical  Laboratory Improvement Amendments CLIA as qualified to perform high complexity clinical laboratory testing.    TRIMETH /SULFA  Value in next row Sensitive      16 SENSITIVEThis is a modified FDA-approved test that has been validated and its performance characteristics determined by the reporting laboratory.  This laboratory is certified under the  Clinical Laboratory Improvement Amendments CLIA as qualified to perform high complexity clinical laboratory testing.    AMPICILLIN/SULBACTAM Value in next row Resistant      16 SENSITIVEThis is a modified FDA-approved test that has been validated and its performance characteristics determined by the reporting laboratory.  This laboratory is certified under the Clinical Laboratory Improvement Amendments CLIA as qualified to perform high complexity clinical laboratory testing.    PIP/TAZO Value in next row Sensitive      <=4 SENSITIVEThis is a modified FDA-approved test that has been validated and its performance characteristics determined by the reporting laboratory.  This laboratory is certified under the Clinical Laboratory Improvement Amendments CLIA as qualified to perform high complexity clinical laboratory testing.    MEROPENEM Value in next row Sensitive      <=4 SENSITIVEThis is a modified FDA-approved test that has been validated and its performance characteristics determined by the reporting laboratory.  This laboratory is certified under the Clinical Laboratory Improvement Amendments CLIA as qualified to perform high complexity clinical laboratory testing.    * >=100,000 COLONIES/mL ESCHERICHIA COLI  Blood Culture ID Panel (Reflexed)     Status: None   Collection Time: 05/17/24 11:08 PM  Result Value Ref Range Status   Enterococcus faecalis NOT DETECTED NOT DETECTED Final   Enterococcus Faecium NOT DETECTED NOT DETECTED Final   Listeria monocytogenes NOT DETECTED NOT DETECTED Final   Staphylococcus species NOT DETECTED NOT DETECTED Final    Staphylococcus aureus (BCID) NOT DETECTED NOT DETECTED Final   Staphylococcus epidermidis NOT DETECTED NOT DETECTED Final   Staphylococcus lugdunensis NOT DETECTED NOT DETECTED Final   Streptococcus species NOT DETECTED NOT DETECTED Final   Streptococcus agalactiae NOT DETECTED NOT DETECTED Final   Streptococcus pneumoniae NOT DETECTED NOT DETECTED Final   Streptococcus pyogenes NOT DETECTED NOT DETECTED Final   A.calcoaceticus-baumannii NOT DETECTED NOT DETECTED Final   Bacteroides fragilis NOT DETECTED NOT DETECTED Final   Enterobacterales NOT DETECTED NOT DETECTED Final   Enterobacter cloacae complex NOT DETECTED NOT DETECTED Final   Escherichia coli NOT DETECTED NOT DETECTED Final   Klebsiella aerogenes NOT DETECTED NOT DETECTED Final   Klebsiella oxytoca NOT DETECTED NOT DETECTED Final   Klebsiella pneumoniae NOT DETECTED NOT DETECTED Final   Proteus species NOT DETECTED NOT DETECTED Final   Salmonella species NOT DETECTED NOT DETECTED Final   Serratia marcescens NOT DETECTED NOT DETECTED Final   Haemophilus influenzae NOT DETECTED NOT DETECTED Final   Neisseria meningitidis NOT DETECTED NOT DETECTED Final   Pseudomonas aeruginosa NOT DETECTED NOT DETECTED Final   Stenotrophomonas maltophilia NOT DETECTED NOT DETECTED Final   Candida albicans NOT DETECTED NOT DETECTED Final   Candida auris NOT DETECTED NOT DETECTED Final   Candida glabrata NOT DETECTED NOT DETECTED Final   Candida krusei NOT DETECTED NOT DETECTED Final   Candida parapsilosis NOT DETECTED NOT DETECTED Final   Candida tropicalis NOT DETECTED NOT DETECTED Final   Cryptococcus neoformans/gattii NOT DETECTED NOT DETECTED Final    Comment: Performed at [facility], [address] [place], [place] [address]  Blood Culture (routine x 2)     Status: None   Collection Time: 05/17/24 11:27 PM   Specimen: BLOOD LEFT FOREARM  Result Value Ref Range Status   Specimen Description   Final    BLOOD LEFT  FOREARM Performed at [facility], [address], [place], [place] [address]    Special Requests   Final    BOTTLES DRAWN AEROBIC AND  ANAEROBIC Blood Culture results may not be optimal due to an inadequate volume of blood received in culture bottles Performed at [facility], [address], [place], [place] [address]    Culture   Final    NO GROWTH 6 DAYS Performed at [facility], [address] [place], [place] [address]    Report Status 05/24/2024 FINAL  Final      Radiology Studies: No results found.    Scheduled Meds:  allopurinol   200 mg Oral Daily   dorzolamide   1 drop Both Eyes BID   feeding supplement  237 mL Oral BID BM   latanoprost   1 drop Both Eyes QHS   metoprolol  tartrate  25 mg Oral BID   nystatin    Topical BID   rivaroxaban   20 mg Oral Daily   rosuvastatin   10 mg Oral QPM   tamsulosin   0.8 mg Oral Daily   timolol   1 drop Both Eyes BID   Continuous Infusions:   LOS: 8 days   Time spent: 25 minutes   Delon Hoe, DO [facility] 05/26/2024, 2:13 PM   Available via Epic secure chat 7am-7pm After these hours, please refer to coverage provider listed on [email or web address]

## 2024-05-26 NOTE — TOC Progression Note (Signed)
 Transition of Care (TOC) - Progression Note   Patient Details  Name: Colin Hall. MRN: [id number] Date of Birth: 1961/03/17  Transition of Care [facility]) CM/SW Contact  Duwaine GORMAN Aran, LCSW Phone Number: 05/26/2024, 8:31 AM  Clinical Narrative: Care management awaiting bed availability on 05/30/24.  Expected Discharge Plan: Skilled Nursing Facility Barriers to Discharge: Continued Medical Work up  Expected Discharge Plan and Services Living arrangements for the past 2 months: Single Family Home Expected Discharge Date: 05/24/24                Social Drivers of Health (SDOH) Interventions SDOH Screenings   Food Insecurity: No Food Insecurity (05/18/2024)  Housing: Low Risk  (05/18/2024)  Transportation Needs: No Transportation Needs (05/18/2024)  Utilities: Not At Risk (05/18/2024)  Tobacco Use: Low Risk  (05/17/2024)   Readmission Risk Interventions     No data to display

## 2024-05-26 NOTE — Progress Notes (Signed)
   05/26/24 2246  BiPAP/CPAP/SIPAP  BiPAP/CPAP/SIPAP Pt Type Adult (self adm)  BiPAP/CPAP/SIPAP Resmed  Mask Type Full face mask  Dentures removed? Not applicable  Mask Size Large  Respiratory Rate 18 breaths/min  EPAP 15 cmH2O  FiO2 (%) 21 %  Patient Home Machine No  Patient Home Mask No  Patient Home Tubing No  Auto Titrate Yes  Minimum cmH2O 15 cmH2O  Maximum cmH2O 25 cmH2O  CPAP/SIPAP surface wiped down Yes  Device Plugged into RED Power Outlet Yes  BiPAP/CPAP /SiPAP Vitals  Resp 18  Bilateral Breath Sounds Clear;Diminished  MEWS Score/Color  MEWS Score 0  MEWS Score Color Green

## 2024-05-26 NOTE — Progress Notes (Signed)
 Physical Therapy Treatment Patient Details Name: Colin Hall. MRN: [id number] DOB: 09-26-1960 Today's Date: 05/26/2024   History of Present Illness Pt is a 63 y/o M admitted on 05/17/24 after presenting with c/o generalized weakness. Pt is being treated for sepsis due to UTI. PMH: morbid obesity, dCHF, OSA on CPAP, AF on xarelto , HTN, CAD    PT Comments  PT - Cognition Comments: AxO x 3 pleasant and motivated.  Currently retired living home with GrandSon (23 yo).  Pt was IND/Driving prior to admit. Assisted to EOB.  General bed mobility comments: Pt required Max Assist with upper body and Mod Assist with B LE to transition to EOB with increased time and effort to complete scooting to EOB. General transfer comment: Assisted with sit to stand required + 2 Max Assist using Bariatric walker.  Pt had to use momentum and forward weightshift while pulling up using walker.  Required + 2 side by side assist and heavy support to secure walker.  75% VC's to complete turns prior to sitting. General Gait Details: tolerated an increased distance amb with Bari walker + 2 assist for safety such that recliner was following.   Prior Pt was IND/Driving.  LPT has rec AIR.    If plan is discharge home, recommend the following: Two people to help with walking and/or transfers;Two people to help with bathing/dressing/bathroom;Assistance with cooking/housework;Help with stairs or ramp for entrance   Can travel by private vehicle     No  Equipment Recommendations       Recommendations for Other Services Rehab consult     Precautions / Restrictions Precautions Precautions: Fall Restrictions Weight Bearing Restrictions Per Provider Order: No     Mobility  Bed Mobility Overal bed mobility: Needs Assistance Bed Mobility: Supine to Sit     Supine to sit: Mod assist, Used rails, HOB elevated, Max assist     General bed mobility comments: Pt required Max Assist with upper body and Mod Assist with B LE  to transition to EOB with increased time and effort to complete scooting to EOB.    Transfers Overall transfer level: Needs assistance Equipment used: Rolling walker (2 wheels) Transfers: Sit to/from Stand Sit to Stand: Min assist, +2 physical assistance, From elevated surface, Mod assist           General transfer comment: Assisted with sit to stand required + 2 Max Assist using Bariatric walker.  Pt had to use momentum and forward weightshift while pulling up using walker.  Required + 2 side by side assist and heavy support to secure walker.  75% VC's to complete turns prior to sitting.    Ambulation/Gait Ambulation/Gait assistance: Mod assist, Max assist Gait Distance (Feet): 37 Feet Assistive device: Rolling walker (2 wheels)   Gait velocity: decreased     General Gait Details: tolerated an increased distance amb with Bari walker + 2 assist for safety such that recliner was following.   Stairs             Wheelchair Mobility     Tilt Bed    Modified Rankin (Stroke Patients Only)       Balance                                            Communication    Cognition Arousal: Alert Behavior During Therapy: WFL for tasks assessed/performed  PT - Cognitive impairments: No apparent impairments                       PT - Cognition Comments: AxO x 3 pleasant and motivated.  Currently retired living home with GrandSon (55 yo).  Pt was IND/Driving prior to admit.        Cueing    Exercises      General Comments        Pertinent Vitals/Pain Pain Assessment Pain Assessment: No/denies pain    Home Living                          Prior Function            PT Goals (current goals can now be found in the care plan section) Progress towards PT goals: Progressing toward goals    Frequency    Min 2X/week      PT Plan      Co-evaluation              AM-PAC PT 6 Clicks Mobility   Outcome  Measure  Help needed turning from your back to your side while in a flat bed without using bedrails?: A Lot Help needed moving from lying on your back to sitting on the side of a flat bed without using bedrails?: A Lot Help needed moving to and from a bed to a chair (including a wheelchair)?: A Lot Help needed standing up from a chair using your arms (e.g., wheelchair or bedside chair)?: A Lot Help needed to walk in hospital room?: A Lot Help needed climbing 3-5 steps with a railing? : Total 6 Click Score: 11    End of Session Equipment Utilized During Treatment: Gait belt Activity Tolerance: Patient tolerated treatment well;Patient limited by fatigue Patient left: in chair;with call bell/phone within reach Nurse Communication: Mobility status PT Visit Diagnosis: Muscle weakness (generalized) (M62.81);Difficulty in walking, not elsewhere classified (R26.2)     Time: 8877-8853 PT Time Calculation (min) (ACUTE ONLY): 24 min  Charges:    $Gait Training: 8-22 mins $Therapeutic Activity: 8-22 mins PT General Charges $$ ACUTE PT VISIT: 1 Visit                    Katheryn Leap  PTA Acute  Rehabilitation Services Office M-F          [phone]

## 2024-05-26 NOTE — Plan of Care (Signed)

## 2024-05-27 DIAGNOSIS — N390 Urinary tract infection, site not specified: Secondary | ICD-10-CM | POA: Diagnosis not present

## 2024-05-27 DIAGNOSIS — A419 Sepsis, unspecified organism: Secondary | ICD-10-CM | POA: Diagnosis not present

## 2024-05-27 NOTE — Plan of Care (Signed)
  Problem: Education: Goal: Knowledge of General Education information will improve Description: Including pain rating scale, medication(s)/side effects and non-pharmacologic comfort measures Outcome: Progressing   Problem: Health Behavior/Discharge Planning: Goal: Ability to manage health-related needs will improve Outcome: Progressing   Problem: Clinical Measurements: Goal: Ability to maintain clinical measurements within normal limits will improve Outcome: Progressing Goal: Will remain free from infection Outcome: Progressing   Problem: Education: Goal: Knowledge of General Education information will improve Description: Including pain rating scale, medication(s)/side effects and non-pharmacologic comfort measures Outcome: Progressing   Problem: Health Behavior/Discharge Planning: Goal: Ability to manage health-related needs will improve Outcome: Progressing   Problem: Clinical Measurements: Goal: Ability to maintain clinical measurements within normal limits will improve Outcome: Progressing Goal: Will remain free from infection Outcome: Progressing

## 2024-05-27 NOTE — Progress Notes (Signed)
 PROGRESS NOTE    Colin FORBES Pamelia Mickey.  FMW:[id number] DOB: August 02, 1960 DOA: 05/17/2024 PCP: Thurmond Cathlyn LABOR., MD     Brief Narrative:  Colin FORBES Pamelia Mickey. is an 63 y.o. male past medical history of HFpEF, obstructive sleep apnea on CPAP, atrial fibrillation on Xarelto  CAD comes in with generalized weakness, patient resides in an independent living facility transfers and ambulates with a rollator can still drive found to have a possible UTI, with acute kidney injury in the ED on labs.  Patient was admitted for sepsis secondary to UTI, urine culture positive for E. coli.  Patient completed antibiotic therapy in the hospital.  Also with AKI, resolved with treatment.  New events last 24 hours / Subjective: No new issues. Awaiting SNF placement, bed available 11/30 due to lack of bariatric beds available  Assessment & Plan:   Principal Problem:   Sepsis secondary to UTI [facility]) Active Problems:   OSA on CPAP   Chronic diastolic heart failure (HCC)   Glaucoma   PAF (paroxysmal atrial fibrillation) (HCC)   Morbid obesity with BMI of 60.0-69.9, adult (HCC)   HTN (hypertension)   Near syncope   BPH (benign prostatic hyperplasia)   AKI (acute kidney injury)   DVT prophylaxis:  rivaroxaban  (XARELTO ) tablet 20 mg  Code Status: Full code Family Communication: None at bedside Disposition Plan: SNF Status is: Inpatient Remains inpatient appropriate because: Disposition    Antimicrobials:  Anti-infectives (From admission, onward)    Start     Dose/Rate Route Frequency Ordered Stop   05/25/24 2200  sulfamethoxazole -trimethoprim  (BACTRIM  DS) 800-160 MG per tablet 1 tablet        1 tablet Oral Every 12 hours 05/25/24 0930 05/25/24 2122   05/21/24 1000  linezolid  (ZYVOX ) tablet 600 mg  Status:  Discontinued        600 mg Oral Every 12 hours 05/21/24 0818 05/22/24 1348   05/21/24 1000  sulfamethoxazole -trimethoprim  (BACTRIM  DS) 800-160 MG per tablet 1 tablet  Status:  Discontinued        1 tablet  Oral Every 12 hours 05/21/24 0822 05/25/24 0930   05/20/24 2200  linezolid  (ZYVOX ) IVPB 600 mg  Status:  Discontinued        600 mg 300 mL/hr over 60 Minutes Intravenous Every 12 hours 05/20/24 0833 05/21/24 0818   05/20/24 0800  vancomycin  (VANCOREADY) IVPB 1250 mg/250 mL  Status:  Discontinued        1,250 mg 166.7 mL/hr over 90 Minutes Intravenous Every 12 hours 05/19/24 1758 05/20/24 0833   05/19/24 1845  vancomycin  (VANCOREADY) IVPB 2000 mg/400 mL        2,000 mg 200 mL/hr over 120 Minutes Intravenous  Once 05/19/24 1758 05/19/24 2104   05/19/24 0000  cefTRIAXone  (ROCEPHIN ) 2 g in sodium chloride  0.9 % 100 mL IVPB  Status:  Discontinued        2 g 200 mL/hr over 30 Minutes Intravenous Every 24 hours 05/18/24 1210 05/21/24 0822   05/18/24 0600  ceFAZolin  (ANCEF ) IVPB 2g/100 mL premix  Status:  Discontinued        2 g 200 mL/hr over 30 Minutes Intravenous Every 8 hours 05/18/24 0236 05/18/24 1210   05/18/24 0000  cefTRIAXone  (ROCEPHIN ) 1 g in sodium chloride  0.9 % 100 mL IVPB        1 g 200 mL/hr over 30 Minutes Intravenous  Once 05/17/24 2346 05/18/24 0041        Objective: Vitals:   05/26/24 2246 05/27/24 0454 05/27/24  0458 05/27/24 0938  BP:  (!) 108/54  (!) 130/57  Pulse:  72  81  Resp: 18 18    Temp:  98.7 F (37.1 C)    TempSrc:  Oral    SpO2:  94%  94%  Weight:   (!) 191.8 kg   Height:        Intake/Output Summary (Last 24 hours) at 05/27/2024 1053 Last data filed at 05/27/2024 0830 Gross per 24 hour  Intake 480 ml  Output 1450 ml  Net -970 ml   Filed Weights   05/24/24 0500 05/26/24 0500 05/27/24 0458  Weight: (!) 193.4 kg (!) 195.3 kg (!) 191.8 kg    Examination:  General exam: Appears calm and comfortable    Data Reviewed: I have personally reviewed following labs and imaging studies  CBC: No results for input(s): WBC, NEUTROABS, HGB, HCT, MCV, PLT in the last 168 hours. Basic Metabolic Panel: Recent Labs  Lab 05/22/24 0945  05/23/24 0353 05/24/24 0348 05/25/24 0407 05/26/24 0826  NA 136 135 134* 133* 134*  K 4.4 4.4 4.4 4.6 4.1  CL 106 105 103 103 102  CO2 24 23 22 23 23   GLUCOSE 98 97 92 92 108*  BUN 20 21 25* 26* 25*  CREATININE 1.00 1.23 1.16 1.33* 1.15  CALCIUM  8.4* 8.4* 8.6* 8.6* 8.7*   GFR: Estimated Creatinine Clearance: 109.3 mL/min (by C-G formula based on SCr of 1.15 mg/dL). Liver Function Tests: No results for input(s): AST, ALT, ALKPHOS, BILITOT, PROT, ALBUMIN in the last 168 hours. No results for input(s): LIPASE, AMYLASE in the last 168 hours. No results for input(s): AMMONIA in the last 168 hours. Coagulation Profile: No results for input(s): INR, PROTIME in the last 168 hours. Cardiac Enzymes: No results for input(s): CKTOTAL, CKMB, CKMBINDEX, TROPONINI in the last 168 hours. BNP (last 3 results) Recent Labs    05/17/24 2308  PROBNP 72.0   HbA1C: No results for input(s): HGBA1C in the last 72 hours. CBG: No results for input(s): GLUCAP in the last 168 hours. Lipid Profile: No results for input(s): CHOL, HDL, LDLCALC, TRIG, CHOLHDL, LDLDIRECT in the last 72 hours. Thyroid Function Tests: No results for input(s): TSH, T4TOTAL, FREET4, T3FREE, THYROIDAB in the last 72 hours. Anemia Panel: No results for input(s): VITAMINB12, FOLATE, FERRITIN, TIBC, IRON, RETICCTPCT in the last 72 hours. Sepsis Labs: No results for input(s): PROCALCITON, LATICACIDVEN in the last 168 hours.  Recent Results (from the past 240 hours)  Blood Culture (routine x 2)     Status: Abnormal   Collection Time: 05/17/24 11:08 PM   Specimen: Left Antecubital; Blood  Result Value Ref Range Status   Specimen Description   Final    LEFT ANTECUBITAL Performed at [facility], [address], [place], [place] [address]    Special Requests   Final    BOTTLES DRAWN AEROBIC AND ANAEROBIC Blood Culture results may not be  optimal due to an inadequate volume of blood received in culture bottles Performed at [facility], [address], [place], [place] [address]    Culture  Setup Time   Final    GRAM POSITIVE COCCI IN CLUSTERS ANAEROBIC BOTTLE ONLY CRITICAL RESULT CALLED TO, READ BACK BY AND VERIFIED WITH: PHARMD NICK GLOGOVAC ON 05/19/24 @ 144 BY DRT GRAM POSITIVE RODS AEROBIC BOTTLE ONLY CRITICAL RESULT CALLED TO, READ BACK BY AND VERIFIED WITH: PHARMD MICHELLE LILLISTON ON 05/19/24 @ 2154 BY DRT    Culture (A)  Final  FINEGOLDIA MAGNA CORYNEBACTERIUM MINUTISSIMUM Standardized susceptibility testing for this organism is not available. Performed at [facility], [address], [place], [place] [address]    Report Status 05/24/2024 FINAL  Final  Urine Culture     Status: Abnormal   Collection Time: 05/17/24 11:08 PM   Specimen: Urine, Random  Result Value Ref Range Status   Specimen Description   Final    URINE, RANDOM Performed at [facility], [address], [place], [place] [address]    Special Requests   Final    NONE Reflexed from [id number] Performed at [facility], [address], [place], [place] [address]    Culture >=100,000 COLONIES/mL ESCHERICHIA COLI (A)  Final   Report Status 05/20/2024 FINAL  Final   Organism ID, Bacteria ESCHERICHIA COLI (A)  Final      Susceptibility   Escherichia coli - MIC*    AMPICILLIN >=32 RESISTANT Resistant     CEFAZOLIN  (URINE) Value in next row Sensitive      16 SENSITIVEThis is a modified FDA-approved test that has been validated and its performance characteristics determined by the reporting laboratory.  This laboratory is certified under the Clinical Laboratory Improvement Amendments CLIA as qualified to perform high complexity clinical laboratory testing.    CEFEPIME Value in next row Sensitive      16 SENSITIVEThis is a modified FDA-approved test that has been validated and its  performance characteristics determined by the reporting laboratory.  This laboratory is certified under the Clinical Laboratory Improvement Amendments CLIA as qualified to perform high complexity clinical laboratory testing.    ERTAPENEM Value in next row Sensitive      16 SENSITIVEThis is a modified FDA-approved test that has been validated and its performance characteristics determined by the reporting laboratory.  This laboratory is certified under the Clinical Laboratory Improvement Amendments CLIA as qualified to perform high complexity clinical laboratory testing.    CEFTRIAXONE  Value in next row Sensitive      16 SENSITIVEThis is a modified FDA-approved test that has been validated and its performance characteristics determined by the reporting laboratory.  This laboratory is certified under the Clinical Laboratory Improvement Amendments CLIA as qualified to perform high complexity clinical laboratory testing.    CIPROFLOXACIN  Value in next row Sensitive      16 SENSITIVEThis is a modified FDA-approved test that has been validated and its performance characteristics determined by the reporting laboratory.  This laboratory is certified under the Clinical Laboratory Improvement Amendments CLIA as qualified to perform high complexity clinical laboratory testing.    GENTAMICIN Value in next row Sensitive      16 SENSITIVEThis is a modified FDA-approved test that has been validated and its performance characteristics determined by the reporting laboratory.  This laboratory is certified under the Clinical Laboratory Improvement Amendments CLIA as qualified to perform high complexity clinical laboratory testing.    NITROFURANTOIN Value in next row Sensitive      16 SENSITIVEThis is a modified FDA-approved test that has been validated and its performance characteristics determined by the reporting laboratory.  This laboratory is certified under the Clinical Laboratory Improvement Amendments CLIA as  qualified to perform high complexity clinical laboratory testing.    TRIMETH /SULFA  Value in next row Sensitive      16 SENSITIVEThis is a modified FDA-approved test that has been validated and its performance characteristics determined by the reporting laboratory.  This laboratory is certified under the Clinical Laboratory Improvement Amendments CLIA as qualified  to perform high complexity clinical laboratory testing.    AMPICILLIN/SULBACTAM Value in next row Resistant      16 SENSITIVEThis is a modified FDA-approved test that has been validated and its performance characteristics determined by the reporting laboratory.  This laboratory is certified under the Clinical Laboratory Improvement Amendments CLIA as qualified to perform high complexity clinical laboratory testing.    PIP/TAZO Value in next row Sensitive      <=4 SENSITIVEThis is a modified FDA-approved test that has been validated and its performance characteristics determined by the reporting laboratory.  This laboratory is certified under the Clinical Laboratory Improvement Amendments CLIA as qualified to perform high complexity clinical laboratory testing.    MEROPENEM Value in next row Sensitive      <=4 SENSITIVEThis is a modified FDA-approved test that has been validated and its performance characteristics determined by the reporting laboratory.  This laboratory is certified under the Clinical Laboratory Improvement Amendments CLIA as qualified to perform high complexity clinical laboratory testing.    * >=100,000 COLONIES/mL ESCHERICHIA COLI  Blood Culture ID Panel (Reflexed)     Status: None   Collection Time: 05/17/24 11:08 PM  Result Value Ref Range Status   Enterococcus faecalis NOT DETECTED NOT DETECTED Final   Enterococcus Faecium NOT DETECTED NOT DETECTED Final   Listeria monocytogenes NOT DETECTED NOT DETECTED Final   Staphylococcus species NOT DETECTED NOT DETECTED Final   Staphylococcus aureus (BCID) NOT DETECTED NOT  DETECTED Final   Staphylococcus epidermidis NOT DETECTED NOT DETECTED Final   Staphylococcus lugdunensis NOT DETECTED NOT DETECTED Final   Streptococcus species NOT DETECTED NOT DETECTED Final   Streptococcus agalactiae NOT DETECTED NOT DETECTED Final   Streptococcus pneumoniae NOT DETECTED NOT DETECTED Final   Streptococcus pyogenes NOT DETECTED NOT DETECTED Final   A.calcoaceticus-baumannii NOT DETECTED NOT DETECTED Final   Bacteroides fragilis NOT DETECTED NOT DETECTED Final   Enterobacterales NOT DETECTED NOT DETECTED Final   Enterobacter cloacae complex NOT DETECTED NOT DETECTED Final   Escherichia coli NOT DETECTED NOT DETECTED Final   Klebsiella aerogenes NOT DETECTED NOT DETECTED Final   Klebsiella oxytoca NOT DETECTED NOT DETECTED Final   Klebsiella pneumoniae NOT DETECTED NOT DETECTED Final   Proteus species NOT DETECTED NOT DETECTED Final   Salmonella species NOT DETECTED NOT DETECTED Final   Serratia marcescens NOT DETECTED NOT DETECTED Final   Haemophilus influenzae NOT DETECTED NOT DETECTED Final   Neisseria meningitidis NOT DETECTED NOT DETECTED Final   Pseudomonas aeruginosa NOT DETECTED NOT DETECTED Final   Stenotrophomonas maltophilia NOT DETECTED NOT DETECTED Final   Candida albicans NOT DETECTED NOT DETECTED Final   Candida auris NOT DETECTED NOT DETECTED Final   Candida glabrata NOT DETECTED NOT DETECTED Final   Candida krusei NOT DETECTED NOT DETECTED Final   Candida parapsilosis NOT DETECTED NOT DETECTED Final   Candida tropicalis NOT DETECTED NOT DETECTED Final   Cryptococcus neoformans/gattii NOT DETECTED NOT DETECTED Final    Comment: Performed at [facility], [address] [place], [place] [address]  Blood Culture (routine x 2)     Status: None   Collection Time: 05/17/24 11:27 PM   Specimen: BLOOD LEFT FOREARM  Result Value Ref Range Status   Specimen Description   Final    BLOOD LEFT FOREARM Performed at [facility], [address] [place], [place] [address]    Special Requests   Final    BOTTLES DRAWN AEROBIC AND ANAEROBIC Blood Culture results may not be  optimal due to an inadequate volume of blood received in culture bottles Performed at [facility], [address] [place], [place] [address]    Culture   Final    NO GROWTH 6 DAYS Performed at [facility], [address] [place], [place] [address]    Report Status 05/24/2024 FINAL  Final      Radiology Studies: No results found.    Scheduled Meds:  allopurinol   200 mg Oral Daily   dorzolamide   1 drop Both Eyes BID   feeding supplement  237 mL Oral BID BM   latanoprost   1 drop Both Eyes QHS   metoprolol  tartrate  25 mg Oral BID   nystatin    Topical BID   rivaroxaban   20 mg Oral Daily   rosuvastatin   10 mg Oral QPM   tamsulosin   0.8 mg Oral Daily   timolol   1 drop Both Eyes BID   Continuous Infusions:   LOS: 9 days   Time spent: 15 minutes   Delon Hoe, DO [facility] 05/27/2024, 10:53 AM   Available via Epic secure chat 7am-7pm After these hours, please refer to coverage provider listed on [email or web address]

## 2024-05-28 DIAGNOSIS — A419 Sepsis, unspecified organism: Secondary | ICD-10-CM | POA: Diagnosis not present

## 2024-05-28 DIAGNOSIS — N390 Urinary tract infection, site not specified: Secondary | ICD-10-CM | POA: Diagnosis not present

## 2024-05-28 NOTE — Progress Notes (Signed)
 Physical Therapy Treatment Patient Details Name: Colin Hall. MRN: [id number] DOB: [birth year] Today's Date: 05/28/2024   History of Present Illness Pt is a 63 y/o M admitted on 05/17/24 after presenting with c/o generalized weakness. Pt is being treated for sepsis due to UTI. PMH: morbid obesity, dCHF, OSA on CPAP, AF on xarelto , HTN, CAD    PT Comments  PT - Cognition Comments: AxO x 3 pleasant and motivated.  Currently retired living home with GrandSon (74 yo).  Pt was IND/Driving prior to admit. Pt was OOB in recliner.  Assisted with amb in hallway twice.  General transfer comment: Assisted with sit to stand required + 2 Mod Assist using Bariatric walker.  Pt had to use momentum and forward weightshift while pulling up using walker.  Required + 2 side by side assist and heavy support to secure walker.  25% VC's to complete turns prior to sitting. General Gait Details: tolerated an increased distance amb with Bari walker + 2 assist for safety such that recliner was following. Pt plans to D/C to [facility] Nov 30th (bed available)  Prior to admit, Pt was IND, amb NO AD, driving.     If plan is discharge home, recommend the following: Two people to help with walking and/or transfers;Two people to help with bathing/dressing/bathroom;Assistance with cooking/housework;Help with stairs or ramp for entrance   Can travel by private vehicle     No  Equipment Recommendations       Recommendations for Other Services       Precautions / Restrictions       Mobility  Bed Mobility               General bed mobility comments: OOB in recliner    Transfers Overall transfer level: Needs assistance Equipment used: Rolling walker (2 wheels) Transfers: Sit to/from Stand Sit to Stand: Mod assist, +2 physical assistance, +2 safety/equipment           General transfer comment: Assisted with sit to stand required + 2 Mod Assist using Bariatric walker.  Pt had to use  momentum and forward weightshift while pulling up using walker.  Required + 2 side by side assist and heavy support to secure walker.  25% VC's to complete turns prior to sitting.    Ambulation/Gait Ambulation/Gait assistance: Min assist, Mod assist Gait Distance (Feet): 128 Feet   Gait Pattern/deviations: Step-to pattern, Wide base of support Gait velocity: decreased     General Gait Details: tolerated an increased distance amb with Bari walker + 2 assist for safety such that recliner was following.   Stairs             Wheelchair Mobility     Tilt Bed    Modified Rankin (Stroke Patients Only)       Balance                                            Communication    Cognition   Behavior During Therapy: WFL for tasks assessed/performed                           PT - Cognition Comments: AxO x 3 pleasant and motivated.  Currently retired living home with GrandSon (25 yo).  Pt was IND/Driving prior to admit.  Cueing    Exercises      General Comments        Pertinent Vitals/Pain Pain Assessment Pain Assessment: No/denies pain    Home Living                          Prior Function            PT Goals (current goals can now be found in the care plan section) Progress towards PT goals: Progressing toward goals    Frequency    Min 2X/week      PT Plan      Co-evaluation              AM-PAC PT 6 Clicks Mobility   Outcome Measure  Help needed turning from your back to your side while in a flat bed without using bedrails?: A Lot Help needed moving from lying on your back to sitting on the side of a flat bed without using bedrails?: A Lot Help needed moving to and from a bed to a chair (including a wheelchair)?: A Lot Help needed standing up from a chair using your arms (e.g., wheelchair or bedside chair)?: A Lot Help needed to walk in hospital room?: A Lot Help needed climbing 3-5 steps  with a railing? : Total 6 Click Score: 11    End of Session Equipment Utilized During Treatment: Gait belt Activity Tolerance: Patient tolerated treatment well;Patient limited by fatigue Patient left: in chair;with call bell/phone within reach Nurse Communication: Mobility status PT Visit Diagnosis: Muscle weakness (generalized) (M62.81);Difficulty in walking, not elsewhere classified (R26.2)     Time: 8971-8944 PT Time Calculation (min) (ACUTE ONLY): 27 min  Charges:    $Gait Training: 23-37 mins PT General Charges $$ ACUTE PT VISIT: 1 Visit                     Katheryn Leap  PTA Acute  Rehabilitation Services Office M-F          [phone]

## 2024-05-28 NOTE — Progress Notes (Signed)
 PROGRESS NOTE    Colin Hall.  FMW:[id number] DOB: Jun 08, 1961 DOA: 05/17/2024 PCP: Thurmond Cathlyn LABOR., MD     Brief Narrative:  Colin Hall. is an 63 y.o. male past medical history of HFpEF, obstructive sleep apnea on CPAP, atrial fibrillation on Xarelto  CAD comes in with generalized weakness, patient resides in an independent living facility transfers and ambulates with a rollator can still drive found to have a possible UTI, with acute kidney injury in the ED on labs.  Patient was admitted for sepsis secondary to UTI, urine culture positive for E. coli.  Patient completed antibiotic therapy in the hospital.  Also with AKI, resolved with treatment.  New events last 24 hours / Subjective: No new issues. Awaiting SNF placement, bed available 11/30 due to lack of bariatric beds available  Assessment & Plan:   Principal Problem:   Sepsis secondary to UTI [facility]) Active Problems:   OSA on CPAP   Chronic diastolic heart failure (HCC)   Glaucoma   PAF (paroxysmal atrial fibrillation) (HCC)   Morbid obesity with BMI of 60.0-69.9, adult (HCC)   HTN (hypertension)   Near syncope   BPH (benign prostatic hyperplasia)   AKI (acute kidney injury)   DVT prophylaxis:  rivaroxaban  (XARELTO ) tablet 20 mg  Code Status: Full code Family Communication: None at bedside Disposition Plan: SNF Status is: Inpatient Remains inpatient appropriate because: Disposition    Antimicrobials:  Anti-infectives (From admission, onward)    Start     Dose/Rate Route Frequency Ordered Stop   05/25/24 2200  sulfamethoxazole -trimethoprim  (BACTRIM  DS) 800-160 MG per tablet 1 tablet        1 tablet Oral Every 12 hours 05/25/24 0930 05/25/24 2122   05/21/24 1000  linezolid  (ZYVOX ) tablet 600 mg  Status:  Discontinued        600 mg Oral Every 12 hours 05/21/24 0818 05/22/24 1348   05/21/24 1000  sulfamethoxazole -trimethoprim  (BACTRIM  DS) 800-160 MG per tablet 1 tablet  Status:  Discontinued        1 tablet  Oral Every 12 hours 05/21/24 0822 05/25/24 0930   05/20/24 2200  linezolid  (ZYVOX ) IVPB 600 mg  Status:  Discontinued        600 mg 300 mL/hr over 60 Minutes Intravenous Every 12 hours 05/20/24 0833 05/21/24 0818   05/20/24 0800  vancomycin  (VANCOREADY) IVPB 1250 mg/250 mL  Status:  Discontinued        1,250 mg 166.7 mL/hr over 90 Minutes Intravenous Every 12 hours 05/19/24 1758 05/20/24 0833   05/19/24 1845  vancomycin  (VANCOREADY) IVPB 2000 mg/400 mL        2,000 mg 200 mL/hr over 120 Minutes Intravenous  Once 05/19/24 1758 05/19/24 2104   05/19/24 0000  cefTRIAXone  (ROCEPHIN ) 2 g in sodium chloride  0.9 % 100 mL IVPB  Status:  Discontinued        2 g 200 mL/hr over 30 Minutes Intravenous Every 24 hours 05/18/24 1210 05/21/24 0822   05/18/24 0600  ceFAZolin  (ANCEF ) IVPB 2g/100 mL premix  Status:  Discontinued        2 g 200 mL/hr over 30 Minutes Intravenous Every 8 hours 05/18/24 0236 05/18/24 1210   05/18/24 0000  cefTRIAXone  (ROCEPHIN ) 1 g in sodium chloride  0.9 % 100 mL IVPB        1 g 200 mL/hr over 30 Minutes Intravenous  Once 05/17/24 2346 05/18/24 0041        Objective: Vitals:   05/28/24 0427 05/28/24 0430 05/28/24  9078 05/28/24 0933  BP: (!) 112/57  (!) 101/54 113/60  Pulse: 69  75 79  Resp: 20     Temp: 97.7 F (36.5 C)     TempSrc: Oral     SpO2: 96%     Weight:  (!) 199.8 kg    Height:        Intake/Output Summary (Last 24 hours) at 05/28/2024 1038 Last data filed at 05/28/2024 0558 Gross per 24 hour  Intake 238 ml  Output 1200 ml  Net -962 ml   Filed Weights   05/26/24 0500 05/27/24 0458 05/28/24 0430  Weight: (!) 195.3 kg (!) 191.8 kg (!) 199.8 kg    Examination:  General exam: Appears calm and comfortable    Data Reviewed: I have personally reviewed following labs and imaging studies  CBC: No results for input(s): WBC, NEUTROABS, HGB, HCT, MCV, PLT in the last 168 hours. Basic Metabolic Panel: Recent Labs  Lab 05/22/24 0945  05/23/24 0353 05/24/24 0348 05/25/24 0407 05/26/24 0826  NA 136 135 134* 133* 134*  K 4.4 4.4 4.4 4.6 4.1  CL 106 105 103 103 102  CO2 24 23 22 23 23   GLUCOSE 98 97 92 92 108*  BUN 20 21 25* 26* 25*  CREATININE 1.00 1.23 1.16 1.33* 1.15  CALCIUM  8.4* 8.4* 8.6* 8.6* 8.7*   GFR: Estimated Creatinine Clearance: 112.3 mL/min (by C-G formula based on SCr of 1.15 mg/dL). Liver Function Tests: No results for input(s): AST, ALT, ALKPHOS, BILITOT, PROT, ALBUMIN in the last 168 hours. No results for input(s): LIPASE, AMYLASE in the last 168 hours. No results for input(s): AMMONIA in the last 168 hours. Coagulation Profile: No results for input(s): INR, PROTIME in the last 168 hours. Cardiac Enzymes: No results for input(s): CKTOTAL, CKMB, CKMBINDEX, TROPONINI in the last 168 hours. BNP (last 3 results) Recent Labs    05/17/24 2308  PROBNP 72.0   HbA1C: No results for input(s): HGBA1C in the last 72 hours. CBG: No results for input(s): GLUCAP in the last 168 hours. Lipid Profile: No results for input(s): CHOL, HDL, LDLCALC, TRIG, CHOLHDL, LDLDIRECT in the last 72 hours. Thyroid Function Tests: No results for input(s): TSH, T4TOTAL, FREET4, T3FREE, THYROIDAB in the last 72 hours. Anemia Panel: No results for input(s): VITAMINB12, FOLATE, FERRITIN, TIBC, IRON, RETICCTPCT in the last 72 hours. Sepsis Labs: No results for input(s): PROCALCITON, LATICACIDVEN in the last 168 hours.  No results found for this or any previous visit (from the past 240 hours).     Radiology Studies: No results found.    Scheduled Meds:  allopurinol   200 mg Oral Daily   dorzolamide   1 drop Both Eyes BID   feeding supplement  237 mL Oral BID BM   latanoprost   1 drop Both Eyes QHS   metoprolol  tartrate  25 mg Oral BID   nystatin    Topical BID   rivaroxaban   20 mg Oral Daily   rosuvastatin   10 mg Oral QPM   tamsulosin   0.8 mg  Oral Daily   timolol   1 drop Both Eyes BID   Continuous Infusions:   LOS: 10 days   Time spent: 15 minutes   Delon Hoe, DO [facility] 05/28/2024, 10:38 AM   Available via Epic secure chat 7am-7pm After these hours, please refer to coverage provider listed on [email or web address]

## 2024-05-28 NOTE — Progress Notes (Signed)
 Mobility Specialist Progress Note:   05/28/24 1015  Mobility  Activity Pivoted/transferred to/from [facility]  Level of Assistance +2 (takes two people)  Location Manager Ambulated (ft) 3 ft  Activity Response Tolerated well  Mobility Referral Yes  Mobility visit 1 Mobility  Mobility Specialist Start Time (ACUTE ONLY) [id number]  Mobility Specialist Stop Time (ACUTE ONLY) 1010  Mobility Specialist Time Calculation (min) (ACUTE ONLY) 14 min   Pt requested to use BSC. Mod A sit to stand. Returned to recliner with all needs met. Call bell in reach.  [employer] - Mobility Specialist

## 2024-05-28 NOTE — TOC Progression Note (Signed)
 Transition of Care (TOC) - Progression Note    Patient Details  Name: Colin Hall. MRN: [id number] Date of Birth: [birth year]  Transition of Care [facility]) CM/SW Contact  Heather DELENA Saltness, LCSW Phone Number: 05/28/2024, 3:26 PM  Clinical Narrative:    Pt scheduled to discharge to [facility] in [facility] on Sunday 11/30. [facility] staff is Lucie, [phone]. TOC will continue to follow.   Expected Discharge Plan: Skilled Nursing Facility Barriers to Discharge: Continued Medical Work up   Expected Discharge Plan and Services  [facility]     Living arrangements for the past 2 months: Single Family Home Expected Discharge Date: 05/24/24                  Social Drivers of Health (SDOH) Interventions SDOH Screenings   Food Insecurity: No Food Insecurity (05/18/2024)  Housing: Low Risk  (05/18/2024)  Transportation Needs: No Transportation Needs (05/18/2024)  Utilities: Not At Risk (05/18/2024)  Tobacco Use: Low Risk  (05/17/2024)    Readmission Risk Interventions     No data to display          Signed: Heather Saltness, MSW, LCSW Clinical Social Worker Inpatient Care Management 05/28/2024 3:28 PM

## 2024-05-29 DIAGNOSIS — A419 Sepsis, unspecified organism: Secondary | ICD-10-CM | POA: Diagnosis not present

## 2024-05-29 DIAGNOSIS — N390 Urinary tract infection, site not specified: Secondary | ICD-10-CM | POA: Diagnosis not present

## 2024-05-29 NOTE — TOC Progression Note (Signed)
 Transition of Care (TOC) - Progression Note    Patient Details  Name: Colin Hall. MRN: [id number] Date of Birth: Nov 10, 1960  Transition of Care [facility]) CM/SW Contact  Heather DELENA Saltness, LCSW Phone Number: 05/29/2024, 12:35 PM  Clinical Narrative:    CSW spoke with Sheppard [phone], admissions staff, at [facility]. Pt scheduled to transfer to facility tomorrow anytime after 1 PM. D/C summary to be faxed to [phone]. CSW will follow up with Sam tomorrow morning to obtain room number and phone number for RN report. TOC will continue to follow.   Expected Discharge Plan: Skilled Nursing Facility Barriers to Discharge: Continued Medical Work up   Expected Discharge Plan and Services  [facility]     Living arrangements for the past 2 months: Single Family Home Expected Discharge Date: 05/24/24                   Social Drivers of Health (SDOH) Interventions SDOH Screenings   Food Insecurity: No Food Insecurity (05/18/2024)  Housing: Low Risk  (05/18/2024)  Transportation Needs: No Transportation Needs (05/18/2024)  Utilities: Not At Risk (05/18/2024)  Tobacco Use: Low Risk  (05/17/2024)    Readmission Risk Interventions     No data to display          Signed: Heather Saltness, MSW, LCSW Clinical Social Worker Inpatient Care Management 05/29/2024 12:37 PM

## 2024-05-29 NOTE — Progress Notes (Signed)
 PROGRESS NOTE    Colin Hall.  FMW:[id number] DOB: January 18, 1961 DOA: 05/17/2024 PCP: Thurmond Cathlyn LABOR., MD     Brief Narrative:  Colin Hall. is an 63 y.o. male past medical history of HFpEF, obstructive sleep apnea on CPAP, atrial fibrillation on Xarelto  CAD comes in with generalized weakness, patient resides in an independent living facility transfers and ambulates with a rollator can still drive found to have a possible UTI, with acute kidney injury in the ED on labs.  Patient was admitted for sepsis secondary to UTI, urine culture positive for E. coli.  Patient completed antibiotic therapy in the hospital.  Also with AKI, resolved with treatment.  New events last 24 hours / Subjective: No new issues. Awaiting SNF placement, bed available 11/30 due to lack of bariatric beds available  Assessment & Plan:   Principal Problem:   Sepsis secondary to UTI [facility]) Active Problems:   OSA on CPAP   Chronic diastolic heart failure (HCC)   Glaucoma   PAF (paroxysmal atrial fibrillation) (HCC)   Morbid obesity with BMI of 60.0-69.9, adult (HCC)   HTN (hypertension)   Near syncope   BPH (benign prostatic hyperplasia)   AKI (acute kidney injury)   DVT prophylaxis:  rivaroxaban  (XARELTO ) tablet 20 mg  Code Status: Full code Family Communication: None at bedside Disposition Plan: SNF Status is: Inpatient Remains inpatient appropriate because: Disposition    Antimicrobials:  Anti-infectives (From admission, onward)    Start     Dose/Rate Route Frequency Ordered Stop   05/25/24 2200  sulfamethoxazole -trimethoprim  (BACTRIM  DS) 800-160 MG per tablet 1 tablet        1 tablet Oral Every 12 hours 05/25/24 0930 05/25/24 2122   05/21/24 1000  linezolid  (ZYVOX ) tablet 600 mg  Status:  Discontinued        600 mg Oral Every 12 hours 05/21/24 0818 05/22/24 1348   05/21/24 1000  sulfamethoxazole -trimethoprim  (BACTRIM  DS) 800-160 MG per tablet 1 tablet  Status:  Discontinued        1 tablet  Oral Every 12 hours 05/21/24 0822 05/25/24 0930   05/20/24 2200  linezolid  (ZYVOX ) IVPB 600 mg  Status:  Discontinued        600 mg 300 mL/hr over 60 Minutes Intravenous Every 12 hours 05/20/24 0833 05/21/24 0818   05/20/24 0800  vancomycin  (VANCOREADY) IVPB 1250 mg/250 mL  Status:  Discontinued        1,250 mg 166.7 mL/hr over 90 Minutes Intravenous Every 12 hours 05/19/24 1758 05/20/24 0833   05/19/24 1845  vancomycin  (VANCOREADY) IVPB 2000 mg/400 mL        2,000 mg 200 mL/hr over 120 Minutes Intravenous  Once 05/19/24 1758 05/19/24 2104   05/19/24 0000  cefTRIAXone  (ROCEPHIN ) 2 g in sodium chloride  0.9 % 100 mL IVPB  Status:  Discontinued        2 g 200 mL/hr over 30 Minutes Intravenous Every 24 hours 05/18/24 1210 05/21/24 0822   05/18/24 0600  ceFAZolin  (ANCEF ) IVPB 2g/100 mL premix  Status:  Discontinued        2 g 200 mL/hr over 30 Minutes Intravenous Every 8 hours 05/18/24 0236 05/18/24 1210   05/18/24 0000  cefTRIAXone  (ROCEPHIN ) 1 g in sodium chloride  0.9 % 100 mL IVPB        1 g 200 mL/hr over 30 Minutes Intravenous  Once 05/17/24 2346 05/18/24 0041        Objective: Vitals:   05/28/24 1955 05/29/24 0500 05/29/24  0516 05/29/24 0920  BP: 118/61  (!) 113/58 (!) 116/59  Pulse: 84  73 82  Resp: 18  16   Temp: 98.3 F (36.8 C)  98.7 F (37.1 C)   TempSrc: Oral  Oral   SpO2: 96%  98%   Weight:  (!) 201.1 kg    Height:        Intake/Output Summary (Last 24 hours) at 05/29/2024 1144 Last data filed at 05/29/2024 0934 Gross per 24 hour  Intake 480 ml  Output 1950 ml  Net -1470 ml   Filed Weights   05/27/24 0458 05/28/24 0430 05/29/24 0500  Weight: (!) 191.8 kg (!) 199.8 kg (!) 201.1 kg    Examination:  General exam: Appears calm and comfortable    Data Reviewed: I have personally reviewed following labs and imaging studies  CBC: No results for input(s): WBC, NEUTROABS, HGB, HCT, MCV, PLT in the last 168 hours. Basic Metabolic Panel: Recent  Labs  Lab 05/23/24 0353 05/24/24 0348 05/25/24 0407 05/26/24 0826  NA 135 134* 133* 134*  K 4.4 4.4 4.6 4.1  CL 105 103 103 102  CO2 23 22 23 23   GLUCOSE 97 92 92 108*  BUN 21 25* 26* 25*  CREATININE 1.23 1.16 1.33* 1.15  CALCIUM  8.4* 8.6* 8.6* 8.7*   GFR: Estimated Creatinine Clearance: 112.8 mL/min (by C-G formula based on SCr of 1.15 mg/dL). Liver Function Tests: No results for input(s): AST, ALT, ALKPHOS, BILITOT, PROT, ALBUMIN in the last 168 hours. No results for input(s): LIPASE, AMYLASE in the last 168 hours. No results for input(s): AMMONIA in the last 168 hours. Coagulation Profile: No results for input(s): INR, PROTIME in the last 168 hours. Cardiac Enzymes: No results for input(s): CKTOTAL, CKMB, CKMBINDEX, TROPONINI in the last 168 hours. BNP (last 3 results) Recent Labs    05/17/24 2308  PROBNP 72.0   HbA1C: No results for input(s): HGBA1C in the last 72 hours. CBG: No results for input(s): GLUCAP in the last 168 hours. Lipid Profile: No results for input(s): CHOL, HDL, LDLCALC, TRIG, CHOLHDL, LDLDIRECT in the last 72 hours. Thyroid Function Tests: No results for input(s): TSH, T4TOTAL, FREET4, T3FREE, THYROIDAB in the last 72 hours. Anemia Panel: No results for input(s): VITAMINB12, FOLATE, FERRITIN, TIBC, IRON, RETICCTPCT in the last 72 hours. Sepsis Labs: No results for input(s): PROCALCITON, LATICACIDVEN in the last 168 hours.  No results found for this or any previous visit (from the past 240 hours).     Radiology Studies: No results found.    Scheduled Meds:  allopurinol   200 mg Oral Daily   dorzolamide   1 drop Both Eyes BID   feeding supplement  237 mL Oral BID BM   latanoprost   1 drop Both Eyes QHS   metoprolol  tartrate  25 mg Oral BID   nystatin    Topical BID   rivaroxaban   20 mg Oral Daily   rosuvastatin   10 mg Oral QPM   tamsulosin   0.8 mg Oral Daily    timolol   1 drop Both Eyes BID   Continuous Infusions:   LOS: 11 days   Time spent: 15 minutes   Delon Hoe, DO [facility] 05/29/2024, 11:44 AM   Available via Epic secure chat 7am-7pm After these hours, please refer to coverage provider listed on [email or web address]

## 2024-05-30 DIAGNOSIS — N390 Urinary tract infection, site not specified: Secondary | ICD-10-CM | POA: Diagnosis not present

## 2024-05-30 DIAGNOSIS — A419 Sepsis, unspecified organism: Secondary | ICD-10-CM | POA: Diagnosis not present

## 2024-05-30 NOTE — Progress Notes (Signed)
   05/30/24 0053  BiPAP/CPAP/SIPAP  $ Non-Invasive Home Ventilator  Subsequent  BiPAP/CPAP/SIPAP Pt Type Adult  BiPAP/CPAP/SIPAP Resmed  Mask Type Full face mask  Dentures removed? Not applicable  Mask Size Large  FiO2 (%) 21 %  Patient Home Machine No  Patient Home Mask No  Patient Home Tubing No  Auto Titrate Yes  Minimum cmH2O 15 cmH2O  Maximum cmH2O 25 cmH2O  Device Plugged into [employer] Power Outlet Yes

## 2024-05-30 NOTE — Discharge Summary (Signed)
 Physician Discharge Summary  Colin Hall Colin Hall. FMW:[id number] DOB: [birth year] DOA: 05/17/2024  PCP: Thurmond Cathlyn LABOR., MD  Admit date: 05/17/2024 Discharge date: 05/30/2024  Disposition:  SNF  Recommendations for Outpatient Follow-up:  Follow up with PCP  Discharge Condition: Stable CODE STATUS: Full  Diet recommendation: Heart healthy   Brief/Interim Summary: Colin Gladden. is an 63 y.o. male past medical history of HFpEF, obstructive sleep apnea on CPAP, atrial fibrillation on Xarelto  CAD comes in with generalized weakness, patient resides in an independent living facility transfers and ambulates with a rollator can still drive found to have a possible UTI, with acute kidney injury in the ED on labs.  Patient was admitted for sepsis secondary to UTI, urine culture positive for E. coli.  Patient completed antibiotic therapy in the hospital.  Also with AKI, resolved with treatment. Hospitalization prolonged due to need for SNF placement.   Discharge Diagnoses:   Principal Problem:   Sepsis secondary to UTI [facility]) Active Problems:   OSA on CPAP   Chronic diastolic heart failure (HCC)   Glaucoma   PAF (paroxysmal atrial fibrillation) (HCC)   Morbid obesity with BMI of 60.0-69.9, adult (HCC)   HTN (hypertension)   Near syncope   BPH (benign prostatic hyperplasia)   AKI (acute kidney injury)    Discharge Instructions  Discharge Instructions     Diet - low sodium heart healthy   Complete by: As directed    Increase activity slowly   Complete by: As directed    Increase activity slowly   Complete by: As directed    No wound care   Complete by: As directed    No wound care   Complete by: As directed       Allergies as of 05/30/2024   No Known Allergies      Medication List     STOP taking these medications    diltiazem  120 MG 24 hr capsule Commonly known as: CARDIZEM  CD   furosemide  20 MG tablet Commonly known as: LASIX    losartan 50 MG tablet Commonly  known as: COZAAR   spironolactone 25 MG tablet Commonly known as: ALDACTONE   Trospium Chloride 60 MG Cp24       TAKE these medications    acetaminophen  650 MG CR tablet Commonly known as: TYLENOL  Take 1,300 mg by mouth every 8 (eight) hours as needed for pain.   allopurinol  100 MG tablet Commonly known as: ZYLOPRIM  Take 200 mg by mouth daily.   cyanocobalamin 1000 MCG tablet Commonly known as: VITAMIN B12 Take 1,000 mcg by mouth every other day.   dorzolamide  2 % ophthalmic solution Commonly known as: TRUSOPT  Place 1 drop into both eyes 2 (two) times daily.   latanoprost  0.005 % ophthalmic solution Commonly known as: XALATAN  Place 1 drop into both eyes nightly.   metoprolol  tartrate 25 MG tablet Commonly known as: LOPRESSOR  Take 1 tablet (25 mg total) by mouth 2 (two) times daily.   Multi-Vitamin Daily Tabs Take 1 tablet by mouth daily.   mupirocin ointment 2 % Commonly known as: BACTROBAN Apply 1 application  topically daily as needed (boils/ulcers).   rivaroxaban  20 MG Tabs tablet Commonly known as: XARELTO  Take 20 mg by mouth every evening.   rosuvastatin  10 MG tablet Commonly known as: CRESTOR  Take 10 mg by mouth every evening.   tamsulosin  0.4 MG Caps capsule Commonly known as: FLOMAX  Take 0.4 mg by mouth daily.   timolol  0.5 % ophthalmic solution Commonly known as:  TIMOPTIC  Place 1 drop into both eyes 2 (two) times daily.   Urea  41 % Crea Apply to calloused skin once daily. What changed:  how much to take how to take this when to take this reasons to take this additional instructions        Follow-up Information     Thurmond Cathlyn LABOR., MD Follow up.   Specialty: Internal Medicine Contact information: [address] [place] [address] [phone]                No Known Allergies  Procedures/Studies: ECHOCARDIOGRAM COMPLETE Result Date: 05/18/2024    ECHOCARDIOGRAM REPORT   Patient Name:   Colin Antkowiak. Date of  Exam: 05/18/2024 Medical Rec #:  [id number]         Height:       69.0 in Accession #:    [id number]        Weight:       437.0 lb Date of Birth:  01-15-1961          BSA:          2.880 m Patient Age:    63 years          BP:           100/44 mmHg Patient Gender: M                 HR:           98 bpm. Exam Location:  Inpatient Procedure: 2D Echo and Intracardiac Opacification Agent (Both Spectral and Color            Flow Doppler were utilized during procedure). Indications:    Syncope  History:        Patient has no prior history of Echocardiogram examinations.  Sonographer:    Charmaine Gaskins Referring Phys: [id number] PRINCE T DJAN  Sonographer Comments: Technically difficult study due to poor echo windows. Image acquisition challenging due to patient body habitus and 437 lbs. IMPRESSIONS  1. Vigorous LV function with turbulent flow through LV/LVOT No significant obstruction to outflow at rest or with Valsalva. Left ventricular ejection fraction, by estimation, is >75%. The left ventricle has hyperdynamic function. The left ventricle has no regional wall motion abnormalities. There is mild concentric left ventricular hypertrophy.  2. Right ventricular systolic function is normal. The right ventricular size is normal.  3. The mitral valve is normal in structure. Trivial mitral valve regurgitation.  4. The aortic valve is tricuspid. Aortic valve regurgitation is not visualized. Aortic valve sclerosis/calcification is present, without any evidence of aortic stenosis.  5. The inferior vena cava is dilated in size with <50% respiratory variability, suggesting right atrial pressure of 15 mmHg. FINDINGS  Left Ventricle: Vigorous LV function with turbulent flow through LV/LVOT No significant obstruction to outflow at rest or with Valsalva. Left ventricular ejection fraction, by estimation, is >75%. The left ventricle has hyperdynamic function. The left ventricle has no regional wall motion abnormalities. Definity  contrast  agent was given IV to delineate the left ventricular endocardial borders. The left ventricular internal cavity size was normal in size. There is mild concentric left ventricular hypertrophy. Right Ventricle: The right ventricular size is normal. Right vetricular wall thickness was not assessed. Right ventricular systolic function is normal. Left Atrium: Left atrial size was normal in size. Right Atrium: Right atrial size was normal in size. Pericardium: Trivial pericardial effusion is present. Mitral Valve: The mitral valve is normal in structure. Trivial mitral valve regurgitation.  Tricuspid Valve: The tricuspid valve is normal in structure. Tricuspid valve regurgitation is trivial. Aortic Valve: The aortic valve is tricuspid. Aortic valve regurgitation is not visualized. Aortic valve sclerosis/calcification is present, without any evidence of aortic stenosis. Pulmonic Valve: The pulmonic valve was not well visualized. Aorta: The aortic root and ascending aorta are structurally normal, with no evidence of dilitation. Venous: The inferior vena cava is dilated in size with less than 50% respiratory variability, suggesting right atrial pressure of 15 mmHg. IAS/Shunts: No atrial level shunt detected by color flow Doppler.  LEFT VENTRICLE PLAX 2D LVIDd:         4.40 cm   Diastology LVIDs:         2.10 cm   LV e' medial:    11.80 cm/s LV PW:         1.20 cm   LV E/e' medial:  7.8 LV IVS:        1.20 cm   LV e' lateral:   11.90 cm/s LVOT diam:     2.50 cm   LV E/e' lateral: 7.8 LVOT Area:     4.91 cm  RIGHT VENTRICLE RV S prime:     21.30 cm/s LEFT ATRIUM           Index LA diam:      4.10 cm 1.42 cm/m LA Vol (A4C): 64.6 ml 22.43 ml/m   AORTA Ao Root diam: 3.20 cm Ao Asc diam:  3.40 cm MITRAL VALVE                TRICUSPID VALVE MV Area (PHT): 6.88 cm     TR Peak grad:   30.9 mmHg MV E velocity: 92.60 cm/s   TR Vmax:        278.00 cm/s MV A velocity: 134.00 cm/s MV E/A ratio:  0.69         SHUNTS                              Systemic Diam: 2.50 cm Vina Gull MD Electronically signed by Vina Gull MD Signature Date/Time: 05/18/2024/4:59:16 PM    Final    DG Chest Port 1 View Result Date: 05/17/2024 CLINICAL DATA:  Questionable sepsis EXAM: PORTABLE CHEST 1 VIEW COMPARISON:  Chest x-ray 07/19/2014 FINDINGS: The heart size and mediastinal contours are within normal limits. Both lungs are clear. The visualized skeletal structures are unremarkable. IMPRESSION: No active disease. Electronically Signed   By: Greig Pique M.D.   On: 05/17/2024 23:17       Discharge Exam: Vitals:   05/30/24 0652 05/30/24 0800  BP: 106/60 117/62  Pulse: 72 82  Resp: 19 (!) 23  Temp: 98 F (36.7 C) 98.7 F (37.1 C)  SpO2: 97% 98%    General: Pt is alert, awake, not in acute distress Cardiovascular: RRR, S1/S2 +, no edema Respiratory: CTA bilaterally, no wheezing, no rhonchi, no respiratory distress, no conversational dyspnea  Abdominal: Soft, NT, ND Psych: Normal mood and affect, stable judgement and insight     The results of significant diagnostics from this hospitalization (including imaging, microbiology, ancillary and laboratory) are listed below for reference.     Microbiology: No results found for this or any previous visit (from the past 240 hours).   Labs: BNP (last 3 results) No results for input(s): BNP in the last 8760 hours. Basic Metabolic Panel: Recent Labs  Lab 05/24/24 0348 05/25/24 0407 05/26/24 0826  NA  134* 133* 134*  K 4.4 4.6 4.1  CL 103 103 102  CO2 22 23 23   GLUCOSE 92 92 108*  BUN 25* 26* 25*  CREATININE 1.16 1.33* 1.15  CALCIUM  8.6* 8.6* 8.7*   Liver Function Tests: No results for input(s): AST, ALT, ALKPHOS, BILITOT, PROT, ALBUMIN in the last 168 hours. No results for input(s): LIPASE, AMYLASE in the last 168 hours. No results for input(s): AMMONIA in the last 168 hours. CBC: No results for input(s): WBC, NEUTROABS, HGB, HCT, MCV, PLT in the  last 168 hours. Cardiac Enzymes: No results for input(s): CKTOTAL, CKMB, CKMBINDEX, TROPONINI in the last 168 hours. BNP: Invalid input(s): POCBNP CBG: No results for input(s): GLUCAP in the last 168 hours. D-Dimer No results for input(s): DDIMER in the last 72 hours. Hgb A1c No results for input(s): HGBA1C in the last 72 hours. Lipid Profile No results for input(s): CHOL, HDL, LDLCALC, TRIG, CHOLHDL, LDLDIRECT in the last 72 hours. Thyroid function studies No results for input(s): TSH, T4TOTAL, T3FREE, THYROIDAB in the last 72 hours.  Invalid input(s): FREET3 Anemia work up No results for input(s): VITAMINB12, FOLATE, FERRITIN, TIBC, IRON, RETICCTPCT in the last 72 hours. Urinalysis    Component Value Date/Time   COLORURINE YELLOW 05/17/2024 2308   APPEARANCEUR HAZY (A) 05/17/2024 2308   LABSPEC 1.017 05/17/2024 2308   PHURINE 5.0 05/17/2024 2308   GLUCOSEU NEGATIVE 05/17/2024 2308   HGBUR SMALL (A) 05/17/2024 2308   BILIRUBINUR NEGATIVE 05/17/2024 2308   KETONESUR NEGATIVE 05/17/2024 2308   PROTEINUR NEGATIVE 05/17/2024 2308   UROBILINOGEN 2.0 (H) 07/20/2014 0040   NITRITE NEGATIVE 05/17/2024 2308   LEUKOCYTESUR MODERATE (A) 05/17/2024 2308   Sepsis Labs No results for input(s): WBC in the last 168 hours.  Invalid input(s): PROCALCITONIN, LACTICIDVEN Microbiology No results found for this or any previous visit (from the past 240 hours).   Patient was seen and examined on the day of discharge and was found to be in stable condition. Time coordinating discharge: 25 minutes including assessment and coordination of care, as well as examination of the patient.   SIGNED:  Delon Hoe, DO [facility] 05/30/2024, 10:55 AM

## 2024-05-30 NOTE — Progress Notes (Signed)
 [facility] was contacted and report given to [place], CHARITY FUNDRAISER. IVA was removed and Patient belongings packed.

## 2024-05-30 NOTE — TOC Transition Note (Signed)
 Transition of Care [facility]) - Discharge Note   Patient Details  Name: Colin Hall. MRN: [id number] Date of Birth: Feb 27, 1961  Transition of Care [facility]) CM/SW Contact:  Heather DELENA Saltness, LCSW Phone Number: 05/30/2024, 11:27 AM   Clinical Narrative:    Pt discharging to [facility] for AIR services. D/C packet placed in pt's chart at RN station. RN to call report to [phone]. [facility] called at 11:21 AM, scheduled to pick up pt at 12:30 PM. Patient made aware and in agreement with discharge plan. No further TOC needs at this time.   Final next level of care: IP Rehab Facility Barriers to Discharge: Barriers Resolved   Patient Goals and CMS Choice Patient states their goals for this hospitalization and ongoing recovery are:: To go to [facility] CMS Medicare.gov Compare Post Acute Care list provided to:: Patient Choice offered to / list presented to : Patient [place] ownership interest in [facility].provided to:: Patient    Discharge Placement  [facility]  Patient to be transferred to facility by: [facility] Name of family member notified: Patient Patient and family notified of of transfer: 05/30/24  Discharge Plan and Services Additional resources added to the After Visit Summary for  Follow Up                DME Arranged: N/A DME Agency: NA       HH Arranged: NA HH Agency: NA        Social Drivers of Health (SDOH) Interventions SDOH Screenings   Food Insecurity: No Food Insecurity (05/18/2024)  Housing: Low Risk  (05/18/2024)  Transportation Needs: No Transportation Needs (05/18/2024)  Utilities: Not At Risk (05/18/2024)  Tobacco Use: Low Risk  (05/17/2024)     Readmission Risk Interventions     No data to display          Signed: Heather Saltness, MSW, LCSW Clinical Social Worker Inpatient Care Management 05/30/2024 11:39 AM

## 2024-05-30 NOTE — Plan of Care (Signed)
   Problem: Education: Goal: Knowledge of General Education information will improve Description Including pain rating scale, medication(s)/side effects and non-pharmacologic comfort measures Outcome: Progressing   Problem: Health Behavior/Discharge Planning: Goal: Ability to manage health-related needs will improve Outcome: Progressing

## 2024-06-30 ENCOUNTER — Encounter (HOSPITAL_COMMUNITY): Payer: Self-pay | Admitting: *Deleted

## 2024-06-30 ENCOUNTER — Other Ambulatory Visit: Payer: Self-pay

## 2024-06-30 ENCOUNTER — Emergency Department (HOSPITAL_COMMUNITY): Admission: EM | Admit: 2024-06-30 | Discharge: 2024-07-01 | Disposition: A | Discharge status: Home / Self Care

## 2024-06-30 DIAGNOSIS — L03116 Cellulitis of left lower limb: Secondary | ICD-10-CM | POA: Insufficient documentation

## 2024-06-30 DIAGNOSIS — M7989 Other specified soft tissue disorders: Secondary | ICD-10-CM | POA: Diagnosis present

## 2024-06-30 DIAGNOSIS — Z7901 Long term (current) use of anticoagulants: Secondary | ICD-10-CM | POA: Insufficient documentation

## 2024-06-30 DIAGNOSIS — R600 Localized edema: Secondary | ICD-10-CM

## 2024-06-30 LAB — CBC WITH DIFFERENTIAL/PLATELET
Abs Immature Granulocytes: 0.02 K/uL (ref 0.00–0.07)
Basophils Absolute: 0 K/uL (ref 0.0–0.1)
Basophils Relative: 0 %
Eosinophils Absolute: 0.3 K/uL (ref 0.0–0.5)
Eosinophils Relative: 4 %
HCT: 34.5 % — ABNORMAL LOW (ref 39.0–52.0)
Hemoglobin: 10.7 g/dL — ABNORMAL LOW (ref 13.0–17.0)
Immature Granulocytes: 0 %
Lymphocytes Relative: 18 %
Lymphs Abs: 1.2 K/uL (ref 0.7–4.0)
MCH: 30.5 pg (ref 26.0–34.0)
MCHC: 31 g/dL (ref 30.0–36.0)
MCV: 98.3 fL (ref 80.0–100.0)
Monocytes Absolute: 0.7 K/uL (ref 0.1–1.0)
Monocytes Relative: 10 %
Neutro Abs: 4.5 K/uL (ref 1.7–7.7)
Neutrophils Relative %: 68 %
Platelets: 269 K/uL (ref 150–400)
RBC: 3.51 MIL/uL — ABNORMAL LOW (ref 4.22–5.81)
RDW: 14.6 % (ref 11.5–15.5)
WBC: 6.7 K/uL (ref 4.0–10.5)
nRBC: 0 % (ref 0.0–0.2)

## 2024-06-30 LAB — I-STAT CHEM 8, ED
BUN: 19 mg/dL (ref 8–23)
Calcium, Ion: 1.19 mmol/L (ref 1.15–1.40)
Chloride: 102 mmol/L (ref 98–111)
Creatinine, Ser: 1.1 mg/dL (ref 0.61–1.24)
Glucose, Bld: 106 mg/dL — ABNORMAL HIGH (ref 70–99)
HCT: 35 % — ABNORMAL LOW (ref 39.0–52.0)
Hemoglobin: 11.9 g/dL — ABNORMAL LOW (ref 13.0–17.0)
Potassium: 4.4 mmol/L (ref 3.5–5.1)
Sodium: 140 mmol/L (ref 135–145)
TCO2: 26 mmol/L (ref 22–32)

## 2024-06-30 MED ORDER — FUROSEMIDE 10 MG/ML IJ SOLN
40.0000 mg | Freq: Once | INTRAMUSCULAR | Status: AC
  Administered 2024-06-30: 40 mg via INTRAVENOUS
  Filled 2024-06-30: qty 4

## 2024-06-30 MED ORDER — CEPHALEXIN 500 MG PO CAPS: 500.0000 mg | ORAL_CAPSULE | Freq: Four times a day (QID) | ORAL | 0 refills | Status: AC

## 2024-06-30 MED ORDER — CEPHALEXIN 500 MG PO CAPS
500.0000 mg | ORAL_CAPSULE | Freq: Once | ORAL | Status: AC
  Administered 2024-06-30: 500 mg via ORAL
  Filled 2024-06-30: qty 1

## 2024-06-30 MED ORDER — FUROSEMIDE 40 MG PO TABS: 40.0000 mg | ORAL_TABLET | Freq: Every day | ORAL | 0 refills | Status: AC

## 2024-06-30 NOTE — ED Notes (Signed)
 Family member is coming to pick pt up and will be here in about 

## 2024-06-30 NOTE — ED Triage Notes (Signed)
 Pt arrives by [facility] from home due to edema in bilateral legs but left leg is worse than right.  Pt reports that he has multiple weeping wounds and the edema and the weeping have been increasing.  No SOB, no CP, no fever or chills.  Pt was recently hospitalized here at [facility] due to UTI and sepsis and is at home with home health since coming home from rehab.  Home health has been changing dressings on pt legs.

## 2024-06-30 NOTE — ED Notes (Signed)
 Picture of pt legs placed on chart after soaked dressings were removed

## 2024-06-30 NOTE — ED Provider Notes (Signed)
 " [place] EMERGENCY DEPARTMENT AT [facility] Provider Note   CSN: [id number] Arrival date & time: 06/30/24  2053     Patient presents with: Leg Swelling   Colin Hall. is a 63 y.o. male.   63 year old male presents for evaluation of leg swelling.  Has a history of leg swelling was recently taken off of his spironolactone and Lasix .  States he has not had increase in his leg swelling as well as weeping from his wounds.  Has a new wound on his left posterior calf that he states has become more painful and red.  Wound care is taking care of the wound at the house.  Denies any other symptoms or concerns.  He is not having any chest pain or shortness of breath.        Prior to Admission medications  Medication Sig Start Date End Date Taking? Authorizing Provider  cephALEXin (KEFLEX) 500 MG capsule Take 1 capsule (500 mg total) by mouth 4 (four) times daily for 7 days. 06/30/24 07/07/24 Yes Demonta Wombles L, DO  furosemide  (LASIX ) 40 MG tablet Take 1 tablet (40 mg total) by mouth daily. 06/30/24 07/14/24 Yes Royden Bulman L, DO  acetaminophen  (TYLENOL ) 650 MG CR tablet Take 1,300 mg by mouth every 8 (eight) hours as needed for pain.    [provider]  allopurinol  (ZYLOPRIM ) 100 MG tablet Take 200 mg by mouth daily. 12/25/20   [provider]  dorzolamide  (TRUSOPT ) 2 % ophthalmic solution Place 1 drop into both eyes 2 (two) times daily. 02/12/24   [provider]  latanoprost  (XALATAN ) 0.005 % ophthalmic solution Place 1 drop into both eyes nightly. 12/15/19   [provider]  metoprolol  tartrate (LOPRESSOR ) 25 MG tablet Take 1 tablet (25 mg total) by mouth 2 (two) times daily. 07/23/14   Sebastian Lamarr SAUNDERS, PA-C  Multiple Vitamin (MULTI-VITAMIN DAILY) TABS Take 1 tablet by mouth daily.    [provider]  mupirocin ointment (BACTROBAN) 2 % Apply 1 application  topically daily as needed (boils/ulcers).    [provider]   rivaroxaban  (XARELTO ) 20 MG TABS tablet Take 20 mg by mouth every evening. 12/25/20   [provider]  rosuvastatin  (CRESTOR ) 10 MG tablet Take 10 mg by mouth every evening. 12/25/20   [provider]  tamsulosin  (FLOMAX ) 0.4 MG CAPS capsule Take 0.4 mg by mouth daily. 09/22/19   [provider]  timolol  (TIMOPTIC ) 0.5 % ophthalmic solution Place 1 drop into both eyes 2 (two) times daily. 11/23/23   [provider]  Urea  41 % CREA Apply to calloused skin once daily. Patient taking differently: Apply 1 Application topically daily as needed (calluses). 09/21/20   Gaynel Delon CROME, DPM  vitamin B-12 (CYANOCOBALAMIN) 1000 MCG tablet Take 1,000 mcg by mouth every other day.    [provider]    Allergies: Patient has no known allergies.    Review of Systems  Constitutional:  Negative for chills and fever.  HENT:  Negative for ear pain and sore throat.   Eyes:  Negative for pain and visual disturbance.  Respiratory:  Negative for cough and shortness of breath.   Cardiovascular:  Positive for leg swelling. Negative for chest pain and palpitations.  Gastrointestinal:  Negative for abdominal pain and vomiting.  Genitourinary:  Negative for dysuria and hematuria.  Musculoskeletal:  Negative for arthralgias and back pain.  Skin:  Negative for color change and rash.  Neurological:  Negative for seizures and  syncope.  All other systems reviewed and are negative.   Updated Vital Signs BP 129/83   Pulse 85   Temp 98.8 F (37.1 C) (Oral)   Resp 16   SpO2 99%   Physical Exam Vitals and nursing note reviewed.  Constitutional:      General: He is not in acute distress.    Appearance: Normal appearance. He is well-developed. He is obese.  HENT:     Head: Normocephalic and atraumatic.  Eyes:     Conjunctiva/sclera: Conjunctivae normal.  Cardiovascular:     Rate and Rhythm: Normal rate and regular rhythm.     Heart sounds: No murmur  heard. Pulmonary:     Effort: Pulmonary effort is normal. No respiratory distress.     Breath sounds: Normal breath sounds.  Abdominal:     Palpations: Abdomen is soft.     Tenderness: There is no abdominal tenderness.  Musculoskeletal:        General: Swelling present.     Cervical back: Neck supple.     Right lower leg: Edema present.     Left lower leg: Edema present.     Comments: Left calf with open ulcers, 1 appears somewhat erythematous at the mid calf without purulent drainage  Skin:    General: Skin is warm and dry.     Capillary Refill: Capillary refill takes less than 2 seconds.  Neurological:     General: No focal deficit present.     Mental Status: He is alert.  Psychiatric:        Mood and Affect: Mood normal.     (all labs ordered are listed, but only abnormal results are displayed) Labs Reviewed  CBC WITH DIFFERENTIAL/PLATELET - Abnormal; Notable for the following components:      Result Value   RBC 3.51 (*)    Hemoglobin 10.7 (*)    HCT 34.5 (*)    All other components within normal limits  I-STAT CHEM 8, ED - Abnormal; Notable for the following components:   Glucose, Bld 106 (*)    Hemoglobin 11.9 (*)    HCT 35.0 (*)    All other components within normal limits    EKG: None  Radiology: No results found.   Procedures   Medications Ordered in the ED  furosemide  (LASIX ) injection 40 mg (40 mg Intravenous Given 06/30/24 2152)  cephALEXin (KEFLEX) capsule 500 mg (500 mg Oral Given 06/30/24 2144)                                    Medical Decision Making Patient here for worsening swelling of his lower extremities after stopping his Lasix  and now with some mild cellulitis and ulcer of his left calf.  Lab workup fairly unremarkable.  Will start him on Keflex and restart his Lasix .  He was given 1 dose of IV Lasix  here and his first dose of Keflex here.  Advise close up with primary care doctor and otherwise return to the ER for new or worsening  symptoms.  He feels comfortable to plan to be discharged home.  He otherwise appears stable and has no other complaints or concerning signs or symptoms.  Problems Addressed: Cellulitis of left lower extremity: acute illness or injury Leg edema: chronic illness or injury with exacerbation, progression, or side effects of treatment  Amount and/or Complexity of Data Reviewed External Data Reviewed: notes.    Details: Prior ED  records reviewed and patient seen 05-17-24 for near syncope Labs: ordered. Decision-making details documented in ED Course.    Details: Ordered and reviewed by me and unremarkable  Risk OTC drugs. Prescription drug management. Drug therapy requiring intensive monitoring for toxicity.     Final diagnoses:  Leg edema  Cellulitis of left lower extremity    ED Discharge Orders          Ordered    furosemide  (LASIX ) 40 MG tablet  Daily        06/30/24 2223    cephALEXin (KEFLEX) 500 MG capsule  4 times daily        06/30/24 2223               Gennaro Duwaine CROME, DO 06/30/24 2327  "

## 2024-06-30 NOTE — Discharge Instructions (Addendum)
 Take your Lasix  and your antibiotics as prescribed.  Call and follow-up with your primary care doctor.  Return to the ER for new or worsening symptoms.  Continue wound care.

## 2024-06-30 NOTE — ED Notes (Signed)
 Pt has purewick and had 1L out, cannister emptied.  Pt wounds on legs dressed.  Applied non adherent pads and covered them with gauze wrap.  Pt given new non slip socks.

## 2024-07-05 ENCOUNTER — Ambulatory Visit: Admitting: Podiatry

## 2024-07-05 ENCOUNTER — Encounter: Payer: Self-pay | Admitting: Podiatry

## 2024-07-05 DIAGNOSIS — M79674 Pain in right toe(s): Secondary | ICD-10-CM

## 2024-07-05 DIAGNOSIS — B351 Tinea unguium: Secondary | ICD-10-CM

## 2024-07-05 DIAGNOSIS — I739 Peripheral vascular disease, unspecified: Secondary | ICD-10-CM | POA: Diagnosis not present

## 2024-07-05 DIAGNOSIS — M79675 Pain in left toe(s): Secondary | ICD-10-CM

## 2024-07-05 DIAGNOSIS — L84 Corns and callosities: Secondary | ICD-10-CM | POA: Diagnosis not present

## 2024-07-09 NOTE — Progress Notes (Signed)
 "  Subjective:  Patient ID: Colin FORBES Pamelia Mickey., male    DOB: [birth year],  MRN: [id number]  Colin FORBES Pamelia Mickey. presents to clinic today for at risk foot care. Patient has h/o PAD and preulcerative lesion(s) left foot and painful mycotic toenails that limit ambulation. Painful toenails interfere with ambulation. Aggravating factors include wearing enclosed shoe gear. Pain is relieved with periodic professional debridement. Painful preulcerative lesion(s) is/are aggravated when weightbearing with and without shoegear. Pain is relieved with periodic professional debridement. Patient has been hospitalized since his last visit. He has developed lower extremity wounds secondary to chronic LE edema. This will be managed by Wound Care. He has a Wound Care nurse that will see him once weekly for wounds. Chief Complaint  Patient presents with   Nail Problem    Thick painful toenails, 9 week follow up    New problem(s): None.   PCP is Thurmond Cathlyn LABOR., MD.  Allergies[1]  Review of Systems: Negative except as noted in the HPI.  Objective:  There were no vitals filed for this visit. Colin FORBES Pamelia Mickey. is a pleasant 64 y.o. male morbidly obese in NAD. AAO x 3.  Vascular Examination: CFT <4 seconds b/l. DP pulses diminished b/l. PT pulses diminished b/l. Digital hair absent. Skin temperature gradient warm to cool b/l. No ischemia or gangrene. No cyanosis or clubbing noted b/l. Lymphedema present BLE. Ace wraps noted b/l LE.  Neurological Examination: Sensation grossly intact b/l with 10 gram monofilament.  Dermatological Examination: No open wounds. No interdigital macerations.   Toenails 1-5 b/l thick, discolored, elongated with subungual debris and pain on dorsal palpation.   Hyperkeratotic lesion(s) submet head 5 right foot.  No erythema, no edema, no drainage, no fluctuance.  Preulcerative lesion noted submet head 5 left foot. There is visible subdermal hemorrhage. There is no surrounding erythema,  no edema, no drainage, no odor. There is mild fluctuance which is localized to area.  Musculoskeletal Examination: Muscle strength 5/5 to all lower extremity muscle groups bilaterally. No pain, crepitus or joint limitation noted with ROM b/l LE. Pes planus deformity noted bilateral LE.SABRA Patient ambulates with cane assistance.  Radiographs: None  Assessment/Plan: 1. Pain due to onychomycosis of toenails of both feet   2. Pre-ulcerative calluses   3. PVD (peripheral vascular disease)   Patient was evaluated and treated. All patient's and/or POA's questions/concerns addressed on today's visit. Mycotic toenails 1-5 b/l  debrided in length and girth without incident. Callus submet head 5 right foot and preulcerative lesion(s) submet head 5 left foot pared with sharp debridement without incident. Pressure dressing applied to left foot and he was instructed to have Wound Care Nurse evaluate it as well. Continue soft, supportive shoe gear daily. Report any pedal injuries to medical professional. Call office if there are any questions/concerns. Patient/POA to call should there be question/concern in the interim.   Return in about 9 weeks (around 09/06/2024).  Delon LITTIE Merlin, DPM      [facility] LOCATION: [address], [place] [address]                   Office [phone]   [facility] LOCATION: [address] [place], [place] [address] Office [phone]  [phone]     [1] No Known Allergies  "

## 2024-09-06 ENCOUNTER — Ambulatory Visit: Admitting: Podiatry

## 2024-12-02 ENCOUNTER — Ambulatory Visit: Admitting: Podiatry
